# Patient Record
Sex: Female | Born: 1955 | Race: White | Hispanic: No | Marital: Married | Smoking: Never smoker
Health system: Southern US, Community
[De-identification: ages and names within clinical notes are randomized; demographics above are authoritative.]

## PROBLEM LIST (undated history)

## (undated) DIAGNOSIS — Z803 Family history of malignant neoplasm of breast: Secondary | ICD-10-CM

## (undated) DIAGNOSIS — Z8052 Family history of malignant neoplasm of bladder: Secondary | ICD-10-CM

## (undated) DIAGNOSIS — C539 Malignant neoplasm of cervix uteri, unspecified: Secondary | ICD-10-CM

## (undated) DIAGNOSIS — Z806 Family history of leukemia: Secondary | ICD-10-CM

## (undated) DIAGNOSIS — D219 Benign neoplasm of connective and other soft tissue, unspecified: Secondary | ICD-10-CM

## (undated) DIAGNOSIS — Z9889 Other specified postprocedural states: Secondary | ICD-10-CM

## (undated) DIAGNOSIS — R112 Nausea with vomiting, unspecified: Secondary | ICD-10-CM

## (undated) DIAGNOSIS — N200 Calculus of kidney: Secondary | ICD-10-CM

## (undated) DIAGNOSIS — C801 Malignant (primary) neoplasm, unspecified: Secondary | ICD-10-CM

## (undated) DIAGNOSIS — Z87442 Personal history of urinary calculi: Secondary | ICD-10-CM

## (undated) DIAGNOSIS — C50919 Malignant neoplasm of unspecified site of unspecified female breast: Secondary | ICD-10-CM

## (undated) DIAGNOSIS — M1990 Unspecified osteoarthritis, unspecified site: Secondary | ICD-10-CM

## (undated) DIAGNOSIS — I10 Essential (primary) hypertension: Secondary | ICD-10-CM

## (undated) DIAGNOSIS — Z8042 Family history of malignant neoplasm of prostate: Secondary | ICD-10-CM

## (undated) DIAGNOSIS — E7800 Pure hypercholesterolemia, unspecified: Secondary | ICD-10-CM

## (undated) DIAGNOSIS — C55 Malignant neoplasm of uterus, part unspecified: Secondary | ICD-10-CM

## (undated) DIAGNOSIS — Z800 Family history of malignant neoplasm of digestive organs: Secondary | ICD-10-CM

## (undated) DIAGNOSIS — K219 Gastro-esophageal reflux disease without esophagitis: Secondary | ICD-10-CM

## (undated) HISTORY — DX: Family history of malignant neoplasm of bladder: Z80.52

## (undated) HISTORY — DX: Family history of malignant neoplasm of digestive organs: Z80.0

## (undated) HISTORY — DX: Malignant (primary) neoplasm, unspecified: C80.1

## (undated) HISTORY — PX: CERVIX LESION DESTRUCTION: SHX591

## (undated) HISTORY — DX: Malignant neoplasm of cervix uteri, unspecified: C53.9

## (undated) HISTORY — DX: Family history of malignant neoplasm of prostate: Z80.42

## (undated) HISTORY — DX: Calculus of kidney: N20.0

## (undated) HISTORY — DX: Essential (primary) hypertension: I10

## (undated) HISTORY — DX: Malignant neoplasm of uterus, part unspecified: C55

## (undated) HISTORY — PX: LITHOTRIPSY: SUR834

## (undated) HISTORY — DX: Benign neoplasm of connective and other soft tissue, unspecified: D21.9

## (undated) HISTORY — DX: Family history of leukemia: Z80.6

## (undated) HISTORY — DX: Family history of malignant neoplasm of breast: Z80.3

## (undated) HISTORY — PX: COLPOSCOPY: SHX161

## (undated) HISTORY — PX: CHOLECYSTECTOMY: SHX55

## (undated) HISTORY — DX: Pure hypercholesterolemia, unspecified: E78.00

---

## 1995-02-03 HISTORY — PX: ABDOMINAL HYSTERECTOMY: SHX81

## 1999-04-09 ENCOUNTER — Other Ambulatory Visit: Admission: RE | Admit: 1999-04-09 | Discharge: 1999-04-09 | Payer: Self-pay | Admitting: Obstetrics and Gynecology

## 2000-04-08 ENCOUNTER — Other Ambulatory Visit: Admission: RE | Admit: 2000-04-08 | Discharge: 2000-04-08 | Payer: Self-pay | Admitting: Obstetrics and Gynecology

## 2001-04-11 ENCOUNTER — Other Ambulatory Visit: Admission: RE | Admit: 2001-04-11 | Discharge: 2001-04-11 | Payer: Self-pay | Admitting: Obstetrics and Gynecology

## 2002-04-03 ENCOUNTER — Ambulatory Visit (HOSPITAL_BASED_OUTPATIENT_CLINIC_OR_DEPARTMENT_OTHER): Admission: RE | Admit: 2002-04-03 | Discharge: 2002-04-03 | Payer: Self-pay | Admitting: Urology

## 2002-04-03 ENCOUNTER — Encounter: Payer: Self-pay | Admitting: Urology

## 2003-04-04 ENCOUNTER — Other Ambulatory Visit: Admission: RE | Admit: 2003-04-04 | Discharge: 2003-04-04 | Payer: Self-pay | Admitting: Obstetrics and Gynecology

## 2004-04-04 ENCOUNTER — Other Ambulatory Visit: Admission: RE | Admit: 2004-04-04 | Discharge: 2004-04-04 | Payer: Self-pay | Admitting: Obstetrics and Gynecology

## 2005-04-29 ENCOUNTER — Other Ambulatory Visit: Admission: RE | Admit: 2005-04-29 | Discharge: 2005-04-29 | Payer: Self-pay | Admitting: Obstetrics and Gynecology

## 2005-06-24 ENCOUNTER — Ambulatory Visit (HOSPITAL_BASED_OUTPATIENT_CLINIC_OR_DEPARTMENT_OTHER): Admission: RE | Admit: 2005-06-24 | Discharge: 2005-06-24 | Payer: Self-pay | Admitting: Plastic Surgery

## 2005-06-24 ENCOUNTER — Encounter (INDEPENDENT_AMBULATORY_CARE_PROVIDER_SITE_OTHER): Payer: Self-pay | Admitting: Specialist

## 2006-05-04 ENCOUNTER — Other Ambulatory Visit: Admission: RE | Admit: 2006-05-04 | Discharge: 2006-05-04 | Payer: Self-pay | Admitting: Obstetrics and Gynecology

## 2007-10-25 ENCOUNTER — Encounter: Payer: Self-pay | Admitting: Obstetrics and Gynecology

## 2007-10-25 ENCOUNTER — Other Ambulatory Visit: Admission: RE | Admit: 2007-10-25 | Discharge: 2007-10-25 | Payer: Self-pay | Admitting: Obstetrics and Gynecology

## 2007-10-25 ENCOUNTER — Ambulatory Visit: Payer: Self-pay | Admitting: Obstetrics and Gynecology

## 2009-02-19 ENCOUNTER — Other Ambulatory Visit: Admission: RE | Admit: 2009-02-19 | Discharge: 2009-02-19 | Payer: Self-pay | Admitting: Obstetrics and Gynecology

## 2009-02-19 ENCOUNTER — Ambulatory Visit: Payer: Self-pay | Admitting: Obstetrics and Gynecology

## 2009-04-30 ENCOUNTER — Ambulatory Visit: Payer: Self-pay | Admitting: Obstetrics and Gynecology

## 2010-02-25 ENCOUNTER — Other Ambulatory Visit: Payer: Self-pay | Admitting: Obstetrics and Gynecology

## 2010-02-25 ENCOUNTER — Other Ambulatory Visit
Admission: RE | Admit: 2010-02-25 | Discharge: 2010-02-25 | Payer: Self-pay | Source: Home / Self Care | Admitting: Obstetrics and Gynecology

## 2010-02-25 ENCOUNTER — Ambulatory Visit
Admission: RE | Admit: 2010-02-25 | Discharge: 2010-02-25 | Payer: Self-pay | Source: Home / Self Care | Attending: Obstetrics and Gynecology | Admitting: Obstetrics and Gynecology

## 2010-06-20 NOTE — Op Note (Signed)
Kelly Beltran, BE[place]              ACCOUNT NO.:  [id number]   MEDICAL RECORD NO.:  [id number]          PATIENT TYPE:  AMB   LOCATION:  DSC                          FACILITY:  [facility]   PHYSICIAN:  Alfredia Ferguson, M.D.  DATE OF BIRTH:  December 04, 1955   DATE OF PROCEDURE:  06/24/2005  DATE OF DISCHARGE:                                 OPERATIVE REPORT   PREOPERATIVE DIAGNOSES:  1.  4-mm pigmented nevus, left chin.  2.  7 mm non pigmented nevus, glabellar area.   POSTOPERATIVE DIAGNOSES:  1.  4-mm pigmented nevus, left chin.  2.  7 mm non pigmented nevus, glabellar area.   OPERATION:  Excision of nevi x2.   SURGEON:  Dr. Delia Chimes.   ANESTHESIA:  2% Xylocaine 1:100,000 epinephrine.   INDICATIONS FOR SURGERY:  This is a 55 year old woman who has slowly  enlarging nevi on her face.  One is a darkly pigmented nevus left chin which  is now approximately 4 mm in diameter.  The other one appears to be a  nonpigmented nevus approximately 7 mm which has occurred in her glabellar  area and has gotten quite large.  The patient wishes to have both these  areas excised.  She understands she is trading what she has for permanent  and potentially unsightly scar.  In spite of that, she wished to proceed  with surgery.   DESCRIPTION OF SURGERY:  Skin marks were placed in elliptical fashion around  both of the two lesions.  Local anesthesia was infiltrated and the face was  prepped with Betadine and draped with sterile drape.  Attention first  directed to the chin lesion which was excised in elliptical fashion down to  the level subcutaneous tissue.  Wound edges were undermined for distance of  several millimeters in all directions.  Hemostasis accomplished pressure.  The wound was closed by approximating the dermis with interrupted 4-0  Monocryl suture.  Skin edges were united with a interrupted 6-0 nylon  suture.  The glabellar lesion was excised in a similar fashion and was  closed in  identical fashion.  After closing the glabellar area, both areas  were cleansed and dried and light dressings applied.  The patient was  discharged home in satisfactory condition.      Alfredia Ferguson, M.D.  Electronically Signed     WBB/MEDQ  D:  06/24/2005  T:  06/24/2005  Job:  [id number]

## 2010-06-20 NOTE — Op Note (Signed)
Kelly Beltran, Kelly Beltran              ACCOUNT NO.:  [id number]   MEDICAL RECORD NO.:  [id number]          PATIENT TYPE:  AMB   LOCATION:  DSC                          FACILITY:  [facility]   PHYSICIAN:  Alfredia Ferguson, M.D.  DATE OF BIRTH:  10/12/1955   DATE OF PROCEDURE:  DATE OF DISCHARGE:                                 OPERATIVE REPORT   Audio too short to transcribe (less than 5 seconds)      W. Delia Chimes, M.D.     WBB/MEDQ  D:  06/24/2005  T:  06/24/2005  Job:  [id number]

## 2011-03-25 ENCOUNTER — Other Ambulatory Visit (HOSPITAL_COMMUNITY)
Admission: RE | Admit: 2011-03-25 | Discharge: 2011-03-25 | Disposition: A | Discharge status: Home / Self Care | Payer: BC Managed Care – PPO | Source: Ambulatory Visit | Attending: Obstetrics and Gynecology | Admitting: Obstetrics and Gynecology

## 2011-03-25 ENCOUNTER — Ambulatory Visit (INDEPENDENT_AMBULATORY_CARE_PROVIDER_SITE_OTHER): Payer: BC Managed Care – PPO | Admitting: Obstetrics and Gynecology

## 2011-03-25 ENCOUNTER — Encounter: Payer: Self-pay | Admitting: Obstetrics and Gynecology

## 2011-03-25 VITALS — BP 138/84 | Ht 62.0 in | Wt 189.0 lb

## 2011-03-25 DIAGNOSIS — E7800 Pure hypercholesterolemia, unspecified: Secondary | ICD-10-CM | POA: Insufficient documentation

## 2011-03-25 DIAGNOSIS — N200 Calculus of kidney: Secondary | ICD-10-CM | POA: Insufficient documentation

## 2011-03-25 DIAGNOSIS — Z01419 Encounter for gynecological examination (general) (routine) without abnormal findings: Secondary | ICD-10-CM | POA: Insufficient documentation

## 2011-03-25 DIAGNOSIS — D069 Carcinoma in situ of cervix, unspecified: Secondary | ICD-10-CM | POA: Insufficient documentation

## 2011-03-25 DIAGNOSIS — D219 Benign neoplasm of connective and other soft tissue, unspecified: Secondary | ICD-10-CM | POA: Insufficient documentation

## 2011-03-25 DIAGNOSIS — E785 Hyperlipidemia, unspecified: Secondary | ICD-10-CM | POA: Insufficient documentation

## 2011-03-25 NOTE — Progress Notes (Signed)
Patient came to see me today for her annual GYN exam. 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 She's had no recurrences. She is having no pelvic pain. She is having no vaginal bleeding. She does her lab work with her PCP. She is due for a mammogram. She's had 2 normal bone densities. She's had no fractures. She is having minimal menopausal symptoms. They do not require estrogen.  HEENT: Within normal limits. Kennon Portela present. Neck: No masses. Supraclavicular lymph nodes: Not enlarged. Breasts: Examined in both sitting and lying position. Symmetrical without skin changes or masses. Abdomen: Soft no masses guarding or rebound. No hernias. Pelvic: External within normal limits. BUS within normal limits. Vaginal examination shows good estrogen effect, no cystocele enterocele or rectocele. Cervix and uterus absent. Adnexa within normal limits. Rectovaginal confirmatory. Extremities within normal limits.  Assessment: Adenocarcinoma in situ of uterine cervix. Mild menopausal symptoms.  Plan: Mammogram.

## 2011-03-26 LAB — URINALYSIS W MICROSCOPIC + REFLEX CULTURE
Bacteria, UA: NONE SEEN
Bilirubin Urine: NEGATIVE
Casts: NONE SEEN
Crystals: NONE SEEN
Glucose, UA: NEGATIVE mg/dL
Ketones, ur: NEGATIVE mg/dL
Specific Gravity, Urine: 1.009 (ref 1.005–1.030)
Squamous Epithelial / LPF: NONE SEEN
Urobilinogen, UA: 0.2 mg/dL (ref 0.0–1.0)
pH: 6 (ref 5.0–8.0)

## 2012-03-30 ENCOUNTER — Other Ambulatory Visit (HOSPITAL_COMMUNITY)
Admission: RE | Admit: 2012-03-30 | Discharge: 2012-03-30 | Disposition: A | Discharge status: Home / Self Care | Payer: BC Managed Care – PPO | Source: Ambulatory Visit | Attending: Gynecology | Admitting: Gynecology

## 2012-03-30 ENCOUNTER — Encounter: Payer: Self-pay | Admitting: Gynecology

## 2012-03-30 ENCOUNTER — Ambulatory Visit (INDEPENDENT_AMBULATORY_CARE_PROVIDER_SITE_OTHER): Payer: BC Managed Care – PPO | Admitting: Gynecology

## 2012-03-30 VITALS — BP 118/74 | Ht 62.0 in | Wt 192.0 lb

## 2012-03-30 DIAGNOSIS — Z1151 Encounter for screening for human papillomavirus (HPV): Secondary | ICD-10-CM | POA: Insufficient documentation

## 2012-03-30 DIAGNOSIS — R1900 Intra-abdominal and pelvic swelling, mass and lump, unspecified site: Secondary | ICD-10-CM

## 2012-03-30 DIAGNOSIS — N951 Menopausal and female climacteric states: Secondary | ICD-10-CM | POA: Insufficient documentation

## 2012-03-30 DIAGNOSIS — Z1159 Encounter for screening for other viral diseases: Secondary | ICD-10-CM

## 2012-03-30 DIAGNOSIS — Z01419 Encounter for gynecological examination (general) (routine) without abnormal findings: Secondary | ICD-10-CM | POA: Insufficient documentation

## 2012-03-30 DIAGNOSIS — R198 Other specified symptoms and signs involving the digestive system and abdomen: Secondary | ICD-10-CM

## 2012-03-30 NOTE — Patient Instructions (Addendum)

## 2012-03-30 NOTE — Progress Notes (Signed)
Kelly Beltran 07-10-1955 [id number]   History:    57 y.o.  for annual gyn exam with no complaints today. Review of patient's records indicated the following:  1991 cervical conization adenocarcinoma the cervix 1995 cesarean section 1997 total abdominal hysterectomy Subsequent followup Pap smears negative  Colonoscopy not done yet. Bone density study normal 2011. Last Pap smear normal 2013. Patient's mother with history of colon cancer. Patient declined flu vaccine. Patient will check with her primary physician if she has received a Tdap vaccine.  Past medical history,surgical history, family history and social history were all reviewed and documented in the EPIC chart.  Gynecologic History No LMP recorded. Patient has had a hysterectomy. Contraception: status post hysterectomy Last Pap: 2013. Results were: normal Last mammogram: 2011. Results were: normal  Obstetric History OB History   Grav Para Term Preterm Abortions TAB SAB Ect Mult Living   2 1   1  1   1      # Outc Date GA Lbr Len/2nd Wgt Sex Del Anes PTL Lv   1 PAR            2 SAB                ROS: A ROS was performed and pertinent positives and negatives are included in the history.  GENERAL: No fevers or chills. HEENT: No change in vision, no earache, sore throat or sinus congestion. NECK: No pain or stiffness. CARDIOVASCULAR: No chest pain or pressure. No palpitations. PULMONARY: No shortness of breath, cough or wheeze. GASTROINTESTINAL: No abdominal pain, nausea, vomiting or diarrhea, melena or bright red blood per rectum. GENITOURINARY: No urinary frequency, urgency, hesitancy or dysuria. MUSCULOSKELETAL: No joint or muscle pain, no back pain, no recent trauma. DERMATOLOGIC: No rash, no itching, no lesions. ENDOCRINE: No polyuria, polydipsia, no heat or cold intolerance. No recent change in weight. HEMATOLOGICAL: No anemia or easy bruising or bleeding. NEUROLOGIC: No headache, seizures, numbness, tingling or weakness.  PSYCHIATRIC: No depression, no loss of interest in normal activity or change in sleep pattern.     Exam: chaperone present  BP 118/74  Ht 5\' 2"  (1.575 m)  Wt 192 lb (87.091 kg)  BMI 35.11 kg/m2  Body mass index is 35.11 kg/(m^2).  General appearance : Well developed well nourished female. No acute distress HEENT: Neck supple, trachea midline, no carotid bruits, no thyroidmegaly Lungs: Clear to auscultation, no rhonchi or wheezes, or rib retractions  Heart: Regular rate and rhythm, no murmurs or gallops Breast:Examined in sitting and supine position were symmetrical in appearance, no palpable masses or tenderness,  no skin retraction, no nipple inversion, no nipple discharge, no skin discoloration, no axillary or supraclavicular lymphadenopathy Abdomen: no palpable masses or tenderness, no rebound or guarding Extremities: no edema or skin discoloration or tenderness  Pelvic:  Bartholin, Urethra, Skene Glands: Within normal limits             Vagina: No gross lesions or discharge  Cervix:absent  Uterus Absent  Adnexa  Limited due to patient's abdominal girth  Anus and perineum  normal   Rectovaginal  normal sphincter tone without palpated masses or tenderness             Hemoccult       Assessment/Plan:  57 y.o. female for annual exam who needs to schedule colonoscopy. Her mother had history of colon cancer. Her Pap smear was done today. Patient with prior history of adenocarcinoma of the cervix status post TAH in 1997 subsequent  Pap smears normal. Due to limited pelvic exam as a result of patient's abdominal girth she will return to the office next week for an ultrasound. She will also schedule a bone density which is overdue as well. She was reminded to do her breast examination. We discussed importance of calcium vitamin D for osteoporosis prevention as well as regular exercise.  New CDC guidelines is recommending patients be tested once in her lifetime for hepatitis C antibody  who were born between [birth year] through 1966. This was discussed with the patient today and has agreed to be tested today.    Ok Edwards MD, 1:43 PM 03/30/2012

## 2012-04-04 ENCOUNTER — Encounter: Payer: Self-pay | Admitting: Obstetrics and Gynecology

## 2012-04-08 ENCOUNTER — Other Ambulatory Visit: Payer: BC Managed Care – PPO

## 2012-04-08 ENCOUNTER — Ambulatory Visit: Payer: BC Managed Care – PPO | Admitting: Gynecology

## 2012-04-28 ENCOUNTER — Ambulatory Visit (INDEPENDENT_AMBULATORY_CARE_PROVIDER_SITE_OTHER): Payer: BC Managed Care – PPO

## 2012-04-28 DIAGNOSIS — N951 Menopausal and female climacteric states: Secondary | ICD-10-CM

## 2012-04-28 DIAGNOSIS — Z13820 Encounter for screening for osteoporosis: Secondary | ICD-10-CM

## 2012-05-02 ENCOUNTER — Encounter: Payer: Self-pay | Admitting: Gynecology

## 2013-04-19 ENCOUNTER — Encounter: Payer: Self-pay | Admitting: Gynecology

## 2013-05-10 ENCOUNTER — Ambulatory Visit (INDEPENDENT_AMBULATORY_CARE_PROVIDER_SITE_OTHER): Payer: BC Managed Care – PPO | Admitting: Gynecology

## 2013-05-10 ENCOUNTER — Other Ambulatory Visit (HOSPITAL_COMMUNITY)
Admission: RE | Admit: 2013-05-10 | Discharge: 2013-05-10 | Disposition: A | Discharge status: Home / Self Care | Payer: BC Managed Care – PPO | Source: Ambulatory Visit | Attending: Gynecology | Admitting: Gynecology

## 2013-05-10 ENCOUNTER — Encounter: Payer: Self-pay | Admitting: Gynecology

## 2013-05-10 VITALS — BP 128/82 | Ht 61.25 in | Wt 168.0 lb

## 2013-05-10 DIAGNOSIS — Z01419 Encounter for gynecological examination (general) (routine) without abnormal findings: Secondary | ICD-10-CM

## 2013-05-10 DIAGNOSIS — Z87410 Personal history of cervical dysplasia: Secondary | ICD-10-CM

## 2013-05-10 NOTE — Patient Instructions (Signed)

## 2013-05-10 NOTE — Progress Notes (Signed)
Kelly Beltran [birth year] [id number]   History:    58 y.o.  for annual gyn exam with no complaints today. Patient has been eating healthier and exercising regularly she was weighing 192 pounds last year and is down to 168 pounds. Review of patient's records indicated the following: 1991 cervical conization adenocarcinoma the cervix  1995 cesarean section  1997 total abdominal hysterectomy  Subsequent followup Pap smears negative  Colonoscopy not yet done Bone density study normal 2014    Past medical history,surgical history, family history and social history were all reviewed and documented in the EPIC chart.  Gynecologic History No LMP recorded. Patient has had a hysterectomy. Contraception: post menopausal status Last Pap: 2014. Results were: normal Last mammogram: 2015. Results were: normal  Obstetric History OB History  Gravida Para Term Preterm AB SAB TAB Ectopic Multiple Living  2 1   1 1    1     # Outcome Date GA Lbr Len/2nd Weight Sex Delivery Anes PTL Lv  2 SAB           1 PAR                ROS: A ROS was performed and pertinent positives and negatives are included in the history.  GENERAL: No fevers or chills. HEENT: No change in vision, no earache, sore throat or sinus congestion. NECK: No pain or stiffness. CARDIOVASCULAR: No chest pain or pressure. No palpitations. PULMONARY: No shortness of breath, cough or wheeze. GASTROINTESTINAL: No abdominal pain, nausea, vomiting or diarrhea, melena or bright red blood per rectum. GENITOURINARY: No urinary frequency, urgency, hesitancy or dysuria. MUSCULOSKELETAL: No joint or muscle pain, no back pain, no recent trauma. DERMATOLOGIC: No rash, no itching, no lesions. ENDOCRINE: No polyuria, polydipsia, no heat or cold intolerance. No recent change in weight. HEMATOLOGICAL: No anemia or easy bruising or bleeding. NEUROLOGIC: No headache, seizures, numbness, tingling or weakness. PSYCHIATRIC: No depression, no loss of  interest in normal activity or change in sleep pattern.     Exam: chaperone present  BP 128/82  Ht 5' 1.25" (1.556 m)  Wt 168 lb (76.204 kg)  BMI 31.47 kg/m2  Body mass index is 31.47 kg/(m^2).  General appearance : Well developed well nourished female. No acute distress HEENT: Neck supple, trachea midline, no carotid bruits, no thyroidmegaly Lungs: Clear to auscultation, no rhonchi or wheezes, or rib retractions  Heart: Regular rate and rhythm, no murmurs or gallops Breast:Examined in sitting and supine position were symmetrical in appearance, no palpable masses or tenderness,  no skin retraction, no nipple inversion, no nipple discharge, no skin discoloration, no axillary or supraclavicular lymphadenopathy Abdomen: no palpable masses or tenderness, no rebound or guarding Extremities: no edema or skin discoloration or tenderness  Pelvic:  Bartholin, Urethra, Skene Glands: Within normal limits             Vagina: No gross lesions or discharge  Cervix: Absent  Uterus absent  Adnexa  Without masses or tenderness  Anus and perineum  normal   Rectovaginal  normal sphincter tone without palpated masses or tenderness             Hemoccult cards provided     Assessment/Plan:  58 y.o. female for annual exam who needs to schedule colonoscopy. Her mother had history of colon cancer. Her Pap smear was done today. Patient with prior history of adenocarcinoma of the cervix status post TAH in 1997 subsequent Pap smears normal. Fecal Hemoccult cards provided for patient  to submit to the office for testing. Patient's PCP in [facility] has been doing her blood work. Patient was reminded of the importance of calcium and vitamin D in regular exercise for osteoporosis prevention as well as monthly self breast exams.   Note: This dictation was prepared with  Dragon/digital dictation along withSmart phrase technology. Any transcriptional errors that result from this process are  unintentional.   Terrance Mass MD, 12:22 PM 05/10/2013

## 2013-08-08 ENCOUNTER — Other Ambulatory Visit: Payer: BC Managed Care – PPO | Admitting: Anesthesiology

## 2013-08-08 DIAGNOSIS — Z1211 Encounter for screening for malignant neoplasm of colon: Secondary | ICD-10-CM

## 2013-12-04 ENCOUNTER — Encounter: Payer: Self-pay | Admitting: Gynecology

## 2013-12-11 ENCOUNTER — Other Ambulatory Visit: Payer: Self-pay | Admitting: Dermatology

## 2014-05-17 ENCOUNTER — Encounter: Payer: Self-pay | Admitting: Gynecology

## 2014-05-21 ENCOUNTER — Ambulatory Visit (INDEPENDENT_AMBULATORY_CARE_PROVIDER_SITE_OTHER): Payer: BLUE CROSS/BLUE SHIELD | Admitting: Gynecology

## 2014-05-21 ENCOUNTER — Other Ambulatory Visit (HOSPITAL_COMMUNITY)
Admission: RE | Admit: 2014-05-21 | Discharge: 2014-05-21 | Disposition: A | Discharge status: Home / Self Care | Payer: BLUE CROSS/BLUE SHIELD | Source: Ambulatory Visit | Attending: Gynecology | Admitting: Gynecology

## 2014-05-21 ENCOUNTER — Encounter: Payer: Self-pay | Admitting: Gynecology

## 2014-05-21 VITALS — BP 122/78 | Ht 62.0 in | Wt 180.0 lb

## 2014-05-21 DIAGNOSIS — N951 Menopausal and female climacteric states: Secondary | ICD-10-CM | POA: Diagnosis not present

## 2014-05-21 DIAGNOSIS — Z01419 Encounter for gynecological examination (general) (routine) without abnormal findings: Secondary | ICD-10-CM

## 2014-05-21 DIAGNOSIS — L680 Hirsutism: Secondary | ICD-10-CM | POA: Diagnosis not present

## 2014-05-21 DIAGNOSIS — Z780 Asymptomatic menopausal state: Secondary | ICD-10-CM

## 2014-05-21 MED ORDER — ESTRADIOL 10 MCG VA TABS: 1.0000 | ORAL_TABLET | VAGINAL | Status: DC

## 2014-05-21 NOTE — Patient Instructions (Addendum)
Hormone Therapy At menopause, your body begins making less estrogen and progesterone hormones. This causes the body to stop having menstrual periods. This is because estrogen and progesterone hormones control your periods and menstrual cycle. A lack of estrogen may cause symptoms such as:  Hot flushes (or hot flashes).  Vaginal dryness.  Dry skin.  Loss of sex drive.  Risk of bone loss (osteoporosis). When this happens, you may choose to take hormone therapy to get back the estrogen lost during menopause. When the hormone estrogen is given alone, it is usually referred to as ET (Estrogen Therapy). When the hormone progestin is combined with estrogen, it is generally called HT (Hormone Therapy). This was formerly known as hormone replacement therapy (HRT). Your caregiver can help you make a decision on what will be best for you. The decision to use HT seems to change often as new studies are done. Many studies do not agree on the benefits of hormone replacement therapy. LIKELY BENEFITS OF HT INCLUDE PROTECTION FROM:  Hot Flushes (also called hot flashes) - A hot flush is a sudden feeling of heat that spreads over the face and body. The skin may redden like a blush. It is connected with sweats and sleep disturbance. Women going through menopause may have hot flushes a few times a month or several times per day depending on the woman.  Osteoporosis (bone loss)- Estrogen helps guard against bone loss. After menopause, a woman's bones slowly lose calcium and become weak and brittle. As a result, bones are more likely to break. The hip, wrist, and spine are affected most often. Hormone therapy can help slow bone loss after menopause. Weight bearing exercise and taking calcium with vitamin D also can help prevent bone loss. There are also medications that your caregiver can prescribe that can help prevent osteoporosis.  Vaginal Dryness - Loss of estrogen causes changes in the vagina. Its lining may  become thin and dry. These changes can cause pain and bleeding during sexual intercourse. Dryness can also lead to infections. This can cause burning and itching. (Vaginal estrogen treatment can help relieve pain, itching, and dryness.)  Urinary Tract Infections are more common after menopause because of lack of estrogen. Some women also develop urinary incontinence because of low estrogen levels in the vagina and bladder.  Possible other benefits of estrogen include a positive effect on mood and short-term memory in women. RISKS AND COMPLICATIONS  Using estrogen alone without progesterone causes the lining of the uterus to grow. This increases the risk of lining of the uterus (endometrial) cancer. Your caregiver should give another hormone called progestin if you have a uterus.  Women who take combined (estrogen and progestin) HT appear to have an increased risk of breast cancer. The risk appears to be small, but increases throughout the time that HT is taken.  Combined therapy also makes the breast tissue slightly denser which makes it harder to read mammograms (breast X-rays).  Combined, estrogen and progesterone therapy can be taken together every day, in which case there may be spotting of blood. HT therapy can be taken cyclically in which case you will have menstrual periods. Cyclically means HT is taken for a set amount of days, then not taken, then this process is repeated.  HT may increase the risk of stroke, heart attack, breast cancer and forming blood clots in your leg.  Transdermal estrogen (estrogen that is absorbed through the skin with a patch or a cream) may have more positive results with:    Cholesterol.  Blood pressure.  Blood clots. Having the following conditions may indicate you should not have HT:  Endometrial cancer.  Liver disease.  Breast cancer.  Heart disease.  History of blood clots.  Stroke. TREATMENT   If you choose to take HT and have a uterus,  usually estrogen and progestin are prescribed.  Your caregiver will help you decide the best way to take the medications.  Possible ways to take estrogen include:  Pills.  Patches.  Gels.  Sprays.  Vaginal estrogen cream, rings and tablets.  It is best to take the lowest dose possible that will help your symptoms and take them for the shortest period of time that you can.  Hormone therapy can help relieve some of the problems (symptoms) that affect women at menopause. Before making a decision about HT, talk to your caregiver about what is best for you. Be well informed and comfortable with your decisions. HOME CARE INSTRUCTIONS   Follow your caregivers advice when taking the medications.  A Pap test is done to screen for cervical cancer.  The first Pap test should be done at age 21.  Between ages 21 and 29, Pap tests are repeated every 2 years.  Beginning at age 30, you are advised to have a Pap test every 3 years as long as your past 3 Pap tests have been normal.  Some women have medical problems that increase the chance of getting cervical cancer. Talk to your caregiver about these problems. It is especially important to talk to your caregiver if a new problem develops soon after your last Pap test. In these cases, your caregiver may recommend more frequent screening and Pap tests.  The above recommendations are the same for women who have or have not gotten the vaccine for HPV (Human Papillomavirus).  If you had a hysterectomy for a problem that was not a cancer or a condition that could lead to cancer, then you no longer need Pap tests. However, even if you no longer need a Pap test, a regular exam is a good idea to make sure no other problems are starting.   If you are between ages 65 and 70, and you have had normal Pap tests going back 10 years, you no longer need Pap tests. However, even if you no longer need a Pap test, a regular exam is a good idea to make sure no  other problems are starting.   If you have had past treatment for cervical cancer or a condition that could lead to cancer, you need Pap tests and screening for cancer for at least 20 years after your treatment.  If Pap tests have been discontinued, risk factors (such as a new sexual partner) need to be re-assessed to determine if screening should be resumed.  Some women may need screenings more often if they are at high risk for cervical cancer.  Get mammograms done as per the advice of your caregiver. SEEK IMMEDIATE MEDICAL CARE IF:  You develop abnormal vaginal bleeding.  You have pain or swelling in your legs, shortness of breath, or chest pain.  You develop dizziness or headaches.  You have lumps or changes in your breasts or armpits.  You have slurred speech.  You develop weakness or numbness of your arms or legs.  You have pain, burning, or bleeding when urinating.  You develop abdominal pain. Document Released: 10/18/2002 Document Revised: 04/13/2011 Document Reviewed: 02/05/2010 [employer] Patient Information 2015 [employer]. This information is not intended to   replace advice given to you by your health care provider. Make sure you discuss any questions you have with your health care provider. Menopause Menopause is the normal time of life when menstrual periods stop completely. Menopause is complete when you have missed 12 consecutive menstrual periods. It usually occurs between the ages of 25 years and 19 years. Very rarely does a woman develop menopause before the age of 65 years. At menopause, your ovaries stop producing the female hormones estrogen and progesterone. This can cause undesirable symptoms and also affect your health. Sometimes the symptoms may occur 4-5 years before the menopause begins. There is no relationship between menopause and:  Oral contraceptives.  Number of children you had.  Race.  The age your menstrual periods started  (menarche). Heavy smokers and very thin women may develop menopause earlier in life. CAUSES  The ovaries stop producing the female hormones estrogen and progesterone.  Other causes include:  Surgery to remove both ovaries.  The ovaries stop functioning for no known reason.  Tumors of the pituitary gland in the brain.  Medical disease that affects the ovaries and hormone production.  Radiation treatment to the abdomen or pelvis.  Chemotherapy that affects the ovaries. SYMPTOMS   Hot flashes.  Night sweats.  Decrease in sex drive.  Vaginal dryness and thinning of the vagina causing painful intercourse.  Dryness of the skin and developing wrinkles.  Headaches.  Tiredness.  Irritability.  Memory problems.  Weight gain.  Bladder infections.  Hair growth of the face and chest.  Infertility. More serious symptoms include:  Loss of bone (osteoporosis) causing breaks (fractures).  Depression.  Hardening and narrowing of the arteries (atherosclerosis) causing heart attacks and strokes. DIAGNOSIS   When the menstrual periods have stopped for 12 straight months.  Physical exam.  Hormone studies of the blood. TREATMENT  There are many treatment choices and nearly as many questions about them. The decisions to treat or not to treat menopausal changes is an individual choice made with your health care provider. Your health care provider can discuss the treatments with you. Together, you can decide which treatment will work best for you. Your treatment choices may include:   Hormone therapy (estrogen and progesterone).  Non-hormonal medicines.  Treating the individual symptoms with medicine (for example antidepressants for depression).  Herbal medicines that may help specific symptoms.  Counseling by a psychiatrist or psychologist.  Group therapy.  Lifestyle changes including:  Eating healthy.  Regular exercise.  Limiting caffeine and  alcohol.  Stress management and meditation.  No treatment. HOME CARE INSTRUCTIONS   Take the medicine your health care provider gives you as directed.  Get plenty of sleep and rest.  Exercise regularly.  Eat a diet that contains calcium (good for the bones) and soy products (acts like estrogen hormone).  Avoid alcoholic beverages.  Do not smoke.  If you have hot flashes, dress in layers.  Take supplements, calcium, and vitamin D to strengthen bones.  You can use over-the-counter lubricants or moisturizers for vaginal dryness.  Group therapy is sometimes very helpful.  Acupuncture may be helpful in some cases. SEEK MEDICAL CARE IF:   You are not sure you are in menopause.  You are having menopausal symptoms and need advice and treatment.  You are still having menstrual periods after age 76 years.  You have pain with intercourse.  Menopause is complete (no menstrual period for 12 months) and you develop vaginal bleeding.  You need a referral to a specialist (  gynecologist, psychiatrist, or psychologist) for treatment. SEEK IMMEDIATE MEDICAL CARE IF:   You have severe depression.  You have excessive vaginal bleeding.  You fell and think you have a broken bone.  You have pain when you urinate.  You develop leg or chest pain.  You have a fast pounding heart beat (palpitations).  You have severe headaches.  You develop vision problems.  You feel a lump in your breast.  You have abdominal pain or severe indigestion. Document Released: 04/11/2003 Document Revised: 09/21/2012 Document Reviewed: 08/18/2012 [facility] Patient Information 2015 [place], [place]. This information is not intended to replace advice given to you by your health care provider. Make sure you discuss any questions you have with your health care provider. Estradiol vaginal tablets What is this medicine? ESTRADIOL (es tra DYE ole) vaginal tablet is used to help relieve symptoms of vaginal  irritation and dryness that occurs in some women during menopause. This medicine may be used for other purposes; ask your health care provider or pharmacist if you have questions. COMMON BRAND NAME(S): Vagifem What should I tell my health care provider before I take this medicine? They need to know if you have any of these conditions: -abnormal vaginal bleeding -blood vessel disease or blood clots -breast, cervical, endometrial, ovarian, liver, or uterine cancer -dementia -diabetes -gallbladder disease -heart disease or recent heart attack -high blood pressure -high cholesterol -high level of calcium in the blood -hysterectomy -kidney disease -liver disease -migraine headaches -protein C deficiency -protein S deficiency -stroke -systemic lupus erythematosus (SLE) -tobacco smoker -an unusual or allergic reaction to estrogens, other hormones, medicines, foods, dyes, or preservatives -pregnant or trying to get pregnant -breast-feeding How should I use this medicine? This medicine is only for use in the vagina. Do not take by mouth. Wash your hands before and after use. Read package directions carefully. Unwrap the pre-filled applicator package. Lie on your back, part and bend your knees. Gently insert the applicator tip high in the vagina and push the plunger to release the tablet into the vagina. Gently remove the applicator. Throw away the applicator after use. Do not use your medicine more often than directed. Finish the full course prescribed by your doctor or health care professional even if you think your condition is better. Do not stop using except on the advice of your doctor or health care professional. Talk to your pediatrician regarding the use of this medicine in children. A patient package insert for the product will be given with each prescription and refill. Read this sheet carefully each time. The sheet may change frequently. Overdosage: If you think you have taken too  much of this medicine contact a poison control center or emergency room at once. NOTE: This medicine is only for you. Do not share this medicine with others. What if I miss a dose? If you miss a dose, take it as soon as you can. If it is almost time for your next dose, take only that dose. Do not take double or extra doses. What may interact with this medicine? Do not take this medicine with any of the following medications: -aromatase inhibitors like aminoglutethimide, anastrozole, exemestane, letrozole, testolactone This medicine may also interact with the following medications: -antibiotics used to treat tuberculosis like rifabutin, rifampin and rifapentene -raloxifene or tamoxifen -warfarin This list may not describe all possible interactions. Give your health care provider a list of all the medicines, herbs, non-prescription drugs, or dietary supplements you use. Also tell them if you smoke, drink  alcohol, or use illegal drugs. Some items may interact with your medicine. What should I watch for while using this medicine? Visit your health care professional for regular checks on your progress. You will need a regular breast and pelvic exam. You should also discuss the need for regular mammograms with your health care professional, and follow his or her guidelines. This medicine can make your body retain fluid, making your fingers, hands, or ankles swell. Your blood pressure can go up. Contact your doctor or health care professional if you feel you are retaining fluid. If you have any reason to think you are pregnant; stop taking this medicine at once and contact your doctor or health care professional. Tobacco smoking increases the risk of getting a blood clot or having a stroke, especially if you are more than 60 years old. You are strongly advised not to smoke. If you wear contact lenses and notice visual changes, or if the lenses begin to feel uncomfortable, consult your eye care  specialist. If you are going to have elective surgery, you may need to stop taking this medicine beforehand. Consult your health care professional for advice prior to scheduling the surgery. What side effects may I notice from receiving this medicine? Side effects that you should report to your doctor or health care professional as soon as possible: -allergic reactions like skin rash, itching or hives, swelling of the face, lips, or tongue -breast tissue changes or discharge -changes in vision -chest pain -confusion, trouble speaking or understanding -dark urine -general ill feeling or flu-like symptoms -light-colored stools -nausea, vomiting -pain, swelling, warmth in the leg -right upper belly pain -severe headaches -shortness of breath -sudden numbness or weakness of the face, arm or leg -trouble walking, dizziness, loss of balance or coordination -unusual vaginal bleeding -yellowing of the eyes or skin Side effects that usually do not require medical attention (report to your doctor or health care professional if they continue or are bothersome): -hair loss -increased hunger or thirst -increased urination -symptoms of vaginal infection like itching, irritation or unusual discharge -unusually weak or tired This list may not describe all possible side effects. Call your doctor for medical advice about side effects. You may report side effects to FDA at 1-800-FDA-1088. Where should I keep my medicine? Keep out of the reach of children. Store at room temperature between 15 and 30 degrees C (59 and 86 degrees F). Throw away any unused medicine after the expiration date. NOTE: This sheet is a summary. It may not cover all possible information. If you have questions about this medicine, talk to your doctor, pharmacist, or health care provider.  2015, [employer]. (2010-04-23 09:08:58) Bone Densitometry Bone densitometry is a special X-ray that measures your bone density and  can be used to help predict your risk of bone fractures. This test is used to determine bone mineral content and density to diagnose osteoporosis. Osteoporosis is the loss of bone that may cause the bone to become weak. Osteoporosis commonly occurs in women entering menopause. However, it may be found in men and in people with other diseases. PREPARATION FOR TEST No preparation necessary. WHO SHOULD BE TESTED?  All women older than 1.  Postmenopausal women (50 to 45) with risk factors for osteoporosis.  People with a previous fracture caused by normal activities.  People with a small body frame (less than 127 poundsor a body mass index [BMI] of less than 21).  People who have a parent with a hip fracture or history  of osteoporosis.  People who smoke.  People who have rheumatoid arthritis.  Anyone who engages in excessive alcohol use (more than 3 drinks most days).  Women who experience early menopause. WHEN SHOULD YOU BE RETESTED? Current guidelines suggest that you should wait at least 2 years before doing a bone density test again if your first test was normal.Recent studies indicated that women with normal bone density may be able to wait a few years before needing to repeat a bone density test. You should discuss this with your caregiver.  NORMAL FINDINGS   Normal: less than standard deviation below normal (greater than -1).  Osteopenia: 1 to 2.5 standard deviations below normal (-1 to -2.5).  Osteoporosis: greater than 2.5 standard deviations below normal (less than -2.5). Test results are reported as a "T score" and a "Z score."The T score is a number that compares your bone density with the bone density of healthy, young women.The Z score is a number that compares your bone density with the scores of women who are the same age, gender, and race.  Ranges for normal findings may vary among different laboratories and hospitals. You should always check with your doctor after  having lab work or other tests done to discuss the meaning of your test results and whether your values are considered within normal limits. MEANING OF TEST  Your caregiver will go over the test results with you and discuss the importance and meaning of your results, as well as treatment options and the need for additional tests if necessary. OBTAINING THE TEST RESULTS It is your responsibility to obtain your test results. Ask the lab or department performing the test when and how you will get your results. Document Released: 02/11/2004 Document Revised: 04/13/2011 Document Reviewed: 03/05/2010 [facility] Patient Information 2015 [place], [place]. This information is not intended to replace advice given to you by your health care provider. Make sure you discuss any questions you have with your health care provider.

## 2014-05-21 NOTE — Progress Notes (Signed)
Kelly Beltran 16-Nov-1955 [id number]   History:    59 y.o.  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 Patient also is been complaining of gradual increase of hair underneath her chin and upper lip.  Past medical history,surgical history, family history and social history were all reviewed and documented in the EPIC chart.  Gynecologic History No LMP recorded. Patient has had a hysterectomy. Contraception: status post hysterectomy Last Pap: 2015. Results were: normal Last mammogram: 2016. Results were: normal  Obstetric History OB History  Gravida Para Term Preterm AB SAB TAB Ectopic Multiple Living  2 1   1 1    1     # Outcome Date GA Lbr Len/2nd Weight Sex Delivery Anes PTL Lv  2 SAB           1 Para                ROS: A ROS was performed and pertinent positives and negatives are included in the history.  GENERAL: No fevers or chills. HEENT: No change in vision, no earache, sore throat or sinus congestion. NECK: No pain or stiffness. CARDIOVASCULAR: No chest pain or pressure. No palpitations. PULMONARY: No shortness of breath, cough or wheeze. GASTROINTESTINAL: No abdominal pain, nausea, vomiting or diarrhea, melena or bright red blood per rectum. GENITOURINARY: No urinary frequency, urgency, hesitancy or dysuria. MUSCULOSKELETAL: No joint or muscle pain, no back pain, no recent trauma. DERMATOLOGIC: No rash, no itching, no lesions. ENDOCRINE: No polyuria, polydipsia, no heat or cold intolerance. No recent change in weight. HEMATOLOGICAL: No anemia or easy bruising or bleeding. NEUROLOGIC: No headache, seizures, numbness, tingling or weakness.  PSYCHIATRIC: No depression, no loss of interest in normal activity or change in sleep pattern.     Exam: chaperone present  BP 122/78 mmHg  Ht 5\' 2"  (1.575 m)  Wt 180 lb (81.647 kg)  BMI 32.91 kg/m2  Body mass index is 32.91 kg/(m^2).  General appearance : Well developed well nourished female. No acute distress HEENT: Eyes: no retinal hemorrhage or exudates,  Neck supple, trachea midline, no carotid bruits, no thyroidmegaly Lungs: Clear to auscultation, no rhonchi or wheezes, or rib retractions  Heart: Regular rate and rhythm, no murmurs or gallops Breast:Examined in sitting and supine position were symmetrical in appearance, no palpable masses or tenderness,  no skin retraction, no nipple inversion, no nipple discharge, no skin discoloration, no axillary or supraclavicular lymphadenopathy Abdomen: no palpable masses or tenderness, no rebound or guarding Extremities: no edema or skin discoloration or tenderness  Pelvic:  Bartholin, Urethra, Skene Glands: Within normal limits             Vagina: No gross lesions or discharge  Cervix: absent  Uterus absent  Adnexa  Without masses or tenderness  Anus and perineum  normal   Rectovaginal  normal sphincter tone without palpated masses or tenderness             Hemoccult PCP provides     Assessment/Plan:  59 y.o. female for annual exam with severe  Menopausal hot flashes and vaginal dryness. Patient concern about hormone replacement therapy. We discussed Vagifem 10 g to apply intravaginally twice a week for vaginal atrophy which  has minimal absorption and she would like to try it so it was prescribed. For vasomotor symptoms she is going to buy over-the-counter peppermint oil to apply to dabs behind each ear twice a day. Patient will be scheduled to undergo an ultrasound here in the office in the next week for better assessment of her ovaries since she is overweight and complaining of her to tears and to make sure that there is no ovarian  cyst. She was schedule her bone density study for May. Her PCP we'll be doing her blood work. She was going to be referred to the gastroenterologist for her overdue colonoscopy. Pap smear done today.   Terrance Mass MD, 3:31 PM 05/21/2014

## 2014-05-23 LAB — CYTOLOGY - PAP

## 2014-06-26 ENCOUNTER — Ambulatory Visit (INDEPENDENT_AMBULATORY_CARE_PROVIDER_SITE_OTHER): Payer: BLUE CROSS/BLUE SHIELD

## 2014-06-26 ENCOUNTER — Other Ambulatory Visit: Payer: Self-pay | Admitting: Gynecology

## 2014-06-26 DIAGNOSIS — Z13820 Encounter for screening for osteoporosis: Secondary | ICD-10-CM

## 2014-06-26 DIAGNOSIS — Z780 Asymptomatic menopausal state: Secondary | ICD-10-CM | POA: Diagnosis not present

## 2015-06-05 DIAGNOSIS — E119 Type 2 diabetes mellitus without complications: Secondary | ICD-10-CM | POA: Diagnosis not present

## 2015-06-05 DIAGNOSIS — I10 Essential (primary) hypertension: Secondary | ICD-10-CM | POA: Diagnosis not present

## 2015-06-05 DIAGNOSIS — Z6833 Body mass index (BMI) 33.0-33.9, adult: Secondary | ICD-10-CM | POA: Diagnosis not present

## 2015-07-09 DIAGNOSIS — Z1231 Encounter for screening mammogram for malignant neoplasm of breast: Secondary | ICD-10-CM | POA: Diagnosis not present

## 2015-07-09 DIAGNOSIS — Z803 Family history of malignant neoplasm of breast: Secondary | ICD-10-CM | POA: Diagnosis not present

## 2015-08-08 ENCOUNTER — Encounter: Payer: Self-pay | Admitting: Gynecology

## 2015-08-08 ENCOUNTER — Ambulatory Visit (INDEPENDENT_AMBULATORY_CARE_PROVIDER_SITE_OTHER): Payer: BLUE CROSS/BLUE SHIELD | Admitting: Gynecology

## 2015-08-08 VITALS — BP 132/90 | Ht 62.0 in | Wt 185.0 lb

## 2015-08-08 DIAGNOSIS — Z01419 Encounter for gynecological examination (general) (routine) without abnormal findings: Secondary | ICD-10-CM

## 2015-08-08 DIAGNOSIS — Z8541 Personal history of malignant neoplasm of cervix uteri: Secondary | ICD-10-CM | POA: Diagnosis not present

## 2015-08-08 NOTE — Patient Instructions (Signed)
Colonoscopy A colonoscopy is an exam to look at the entire large intestine (colon). This exam can help find problems such as tumors, polyps, inflammation, and areas of bleeding. The exam takes about 1 hour.  LET YOUR HEALTH CARE PROVIDER KNOW ABOUT:   Any allergies you have.  All medicines you are taking, including vitamins, herbs, eye drops, creams, and over-the-counter medicines.  Previous problems you or members of your family have had with the use of anesthetics.  Any blood disorders you have.  Previous surgeries you have had.  Medical conditions you have. RISKS AND COMPLICATIONS  Generally, this is a safe procedure. However, as with any procedure, complications can occur. Possible complications include:  Bleeding.  Tearing or rupture of the colon wall.  Reaction to medicines given during the exam.  Infection (rare). BEFORE THE PROCEDURE   Ask your health care provider about changing or stopping your regular medicines.  You may be prescribed an oral bowel prep. This involves drinking a large amount of medicated liquid, starting the day before your procedure. The liquid will cause you to have multiple loose stools until your stool is almost clear or light green. This cleans out your colon in preparation for the procedure.  Do not eat or drink anything else once you have started the bowel prep, unless your health care provider tells you it is safe to do so.  Arrange for someone to drive you home after the procedure. PROCEDURE   You will be given medicine to help you relax (sedative).  You will lie on your side with your knees bent.  A long, flexible tube with a light and camera on the end (colonoscope) will be inserted through the rectum and into the colon. The camera sends video back to a computer screen as it moves through the colon. The colonoscope also releases carbon dioxide gas to inflate the colon. This helps your health care provider see the area better.  During  the exam, your health care provider may take a small tissue sample (biopsy) to be examined under a microscope if any abnormalities are found.  The exam is finished when the entire colon has been viewed. AFTER THE PROCEDURE   Do not drive for 24 hours after the exam.  You may have a small amount of blood in your stool.  You may pass moderate amounts of gas and have mild abdominal cramping or bloating. This is caused by the gas used to inflate your colon during the exam.  Ask when your test results will be ready and how you will get your results. Make sure you get your test results.   This information is not intended to replace advice given to you by your health care provider. Make sure you discuss any questions you have with your health care provider.   Document Released: 01/17/2000 Document Revised: 11/09/2012 Document Reviewed: 09/26/2012 Elsevier Interactive Patient Education 2016 [employer] Inc[employer]  

## 2015-08-08 NOTE — Progress Notes (Signed)
Kelly Beltran [birth year] [id number]   History:    60 y.o.  for annual gyn exam with no complaints today. She is using peppermint oral which she uses when necessary transdermally for hot flashes. Patient's primary care physician is [facility] who is been doing her blood work and her vaccinations are up-to-date. She had a normal bone density study 2016. 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 Patient's mother had breast cancer at the age of 43  Past medical history,surgical history, family history and social history were all reviewed and documented in the EPIC chart.  Gynecologic History No LMP recorded. Patient has had a hysterectomy. Contraception: status post hysterectomy Last Pap: 2016. Results were: normal Last mammogram: 2017. Results were: normal  Obstetric History OB History  Gravida Para Term Preterm AB SAB TAB Ectopic Multiple Living  2 1   1 1    1     # Outcome Date GA Lbr Len/2nd Weight Sex Delivery Anes PTL Lv  2 SAB           1 Para                ROS: A ROS was performed and pertinent positives and negatives are included in the history.  GENERAL: No fevers or chills. HEENT: No change in vision, no earache, sore throat or sinus congestion. NECK: No pain or stiffness. CARDIOVASCULAR: No chest pain or pressure. No palpitations. PULMONARY: No shortness of breath, cough or wheeze. GASTROINTESTINAL: No abdominal pain, nausea, vomiting or diarrhea, melena or bright red blood per rectum. GENITOURINARY: No urinary frequency, urgency, hesitancy or dysuria. MUSCULOSKELETAL: No joint or muscle pain, no back pain, no recent trauma. DERMATOLOGIC: No rash, no itching, no lesions. ENDOCRINE: No polyuria, polydipsia, no heat or cold intolerance. No recent change in weight. HEMATOLOGICAL: No anemia  or easy bruising or bleeding. NEUROLOGIC: No headache, seizures, numbness, tingling or weakness. PSYCHIATRIC: No depression, no loss of interest in normal activity or change in sleep pattern.     Exam: chaperone present  BP 132/90 mmHg  Ht 5\' 2"  (1.575 m)  Wt 185 lb (83.915 kg)  BMI 33.83 kg/m2  Body mass index is 33.83 kg/(m^2).  General appearance : Well developed well nourished female. No acute distress HEENT: Eyes: no retinal hemorrhage or exudates,  Neck supple, trachea midline, no carotid bruits, no thyroidmegaly Lungs: Clear to auscultation, no rhonchi or wheezes, or rib retractions  Heart: Regular rate and rhythm, no murmurs or gallops Breast:Examined in sitting and supine position were symmetrical in appearance, no palpable masses or tenderness,  no skin retraction, no nipple inversion, no nipple discharge, no skin discoloration, no axillary or supraclavicular lymphadenopathy Abdomen: no palpable masses or tenderness, no rebound or guarding Extremities: no edema or skin discoloration or tenderness  Pelvic:  Bartholin, Urethra, Skene Glands: Within normal limits             Vagina: No gross lesions or discharge, atrophic changes  Cervix: Absent  Uterus  absent  Adnexa  Without masses or tenderness  Anus and perineum  normal   Rectovaginal  normal sphincter tone without palpated masses or tenderness             Hemoccult PCP provides     Assessment/Plan:  60 y.o. female for annual exam is receiving her blood work by  her PCP who is been monitoring treating her for type 2 diabetes and hypercholesterolemia. Patient with past history of adenocarcinoma in situ of the cervix was a cecal hysterectomy and Pap smears been normal. Pap smear done today. Patient will need a bone density study next year. Patient will discuss with her PCP to schedule her overdue colonoscopy. We discussed importance of calcium vitamin D and weightbearing exercise for osteoporosis  prevention.   Terrance Mass MD, 10:38 AM 08/08/2015

## 2015-08-08 NOTE — Addendum Note (Signed)
Addended by: Thurnell Garbe A on: 08/08/2015 10:56 AM   Modules accepted: Orders, SmartSet

## 2015-08-09 LAB — PAP IG W/ RFLX HPV ASCU

## 2015-08-12 DIAGNOSIS — D235 Other benign neoplasm of skin of trunk: Secondary | ICD-10-CM | POA: Diagnosis not present

## 2015-08-12 DIAGNOSIS — L821 Other seborrheic keratosis: Secondary | ICD-10-CM | POA: Diagnosis not present

## 2015-08-12 DIAGNOSIS — L918 Other hypertrophic disorders of the skin: Secondary | ICD-10-CM | POA: Diagnosis not present

## 2015-08-12 DIAGNOSIS — D1801 Hemangioma of skin and subcutaneous tissue: Secondary | ICD-10-CM | POA: Diagnosis not present

## 2015-09-05 DIAGNOSIS — E119 Type 2 diabetes mellitus without complications: Secondary | ICD-10-CM | POA: Diagnosis not present

## 2015-09-05 DIAGNOSIS — E782 Mixed hyperlipidemia: Secondary | ICD-10-CM | POA: Diagnosis not present

## 2015-09-05 DIAGNOSIS — I10 Essential (primary) hypertension: Secondary | ICD-10-CM | POA: Diagnosis not present

## 2015-09-05 DIAGNOSIS — Z1389 Encounter for screening for other disorder: Secondary | ICD-10-CM | POA: Diagnosis not present

## 2015-12-06 DIAGNOSIS — I10 Essential (primary) hypertension: Secondary | ICD-10-CM | POA: Diagnosis not present

## 2015-12-06 DIAGNOSIS — E119 Type 2 diabetes mellitus without complications: Secondary | ICD-10-CM | POA: Diagnosis not present

## 2016-05-15 DIAGNOSIS — J019 Acute sinusitis, unspecified: Secondary | ICD-10-CM | POA: Diagnosis not present

## 2016-06-04 DIAGNOSIS — E669 Obesity, unspecified: Secondary | ICD-10-CM | POA: Diagnosis not present

## 2016-06-04 DIAGNOSIS — E119 Type 2 diabetes mellitus without complications: Secondary | ICD-10-CM | POA: Diagnosis not present

## 2016-06-04 DIAGNOSIS — Z6832 Body mass index (BMI) 32.0-32.9, adult: Secondary | ICD-10-CM | POA: Diagnosis not present

## 2016-06-04 DIAGNOSIS — I10 Essential (primary) hypertension: Secondary | ICD-10-CM | POA: Diagnosis not present

## 2016-06-17 ENCOUNTER — Encounter: Payer: Self-pay | Admitting: Gynecology

## 2016-09-02 ENCOUNTER — Encounter: Payer: Self-pay | Admitting: Gynecology

## 2016-09-02 DIAGNOSIS — Z1231 Encounter for screening mammogram for malignant neoplasm of breast: Secondary | ICD-10-CM | POA: Diagnosis not present

## 2016-09-02 DIAGNOSIS — Z803 Family history of malignant neoplasm of breast: Secondary | ICD-10-CM | POA: Diagnosis not present

## 2016-09-04 ENCOUNTER — Ambulatory Visit (INDEPENDENT_AMBULATORY_CARE_PROVIDER_SITE_OTHER): Payer: BLUE CROSS/BLUE SHIELD | Admitting: Gynecology

## 2016-09-04 ENCOUNTER — Encounter: Payer: Self-pay | Admitting: Gynecology

## 2016-09-04 VITALS — BP 126/82 | Ht 62.5 in | Wt 185.0 lb

## 2016-09-04 DIAGNOSIS — Z780 Asymptomatic menopausal state: Secondary | ICD-10-CM

## 2016-09-04 DIAGNOSIS — Z01419 Encounter for gynecological examination (general) (routine) without abnormal findings: Secondary | ICD-10-CM | POA: Diagnosis not present

## 2016-09-04 NOTE — Patient Instructions (Addendum)
Bone Densitometry Bone densitometry is an imaging test that uses a special X-ray to measure the amount of calcium and other minerals in your bones (bone density). This test is also known as a bone mineral density test or dual-energy X-ray absorptiometry (DXA). The test can measure bone density at your hip and your spine. It is similar to having a regular X-ray. You may have this test to:  Diagnose a condition that causes weak or thin bones (osteoporosis).  Predict your risk of a broken bone (fracture).  Determine how well osteoporosis treatment is working.  Tell a health care provider about:  Any allergies you have.  All medicines you are taking, including vitamins, herbs, eye drops, creams, and over-the-counter medicines.  Any problems you or family members have had with anesthetic medicines.  Any blood disorders you have.  Any surgeries you have had.  Any medical conditions you have.  Possibility of pregnancy.  Any other medical test you had within the previous 14 days that used contrast material. What are the risks? Generally, this is a safe procedure. However, problems can occur and may include the following:  This test exposes you to a very small amount of radiation.  The risks of radiation exposure may be greater to unborn children.  What happens before the procedure?  Do not take any calcium supplements for 24 hours before having the test. You can otherwise eat and drink what you usually do.  Take off all metal jewelry, eyeglasses, dental appliances, and any other metal objects. What happens during the procedure?  You may lie on an exam table. There will be an X-ray generator below you and an imaging device above you.  Other devices, such as boxes or braces, may be used to position your body properly for the scan.  You will need to lie still while the machine slowly scans your body.  The images will show up on a computer monitor. What happens after the  procedure? You may need more testing at a later time. This information is not intended to replace advice given to you by your health care provider. Make sure you discuss any questions you have with your health care provider. Document Released: 02/11/2004 Document Revised: 06/27/2015 Document Reviewed: 06/29/2013 Elsevier Interactive Patient Education  2018 [employer].   Colonoscopy, Adult A colonoscopy is an exam to look at the entire large intestine. During the exam, a lubricated, bendable tube is inserted into the anus and then passed into the rectum, colon, and other parts of the large intestine. A colonoscopy is often done as a part of normal colorectal screening or in response to certain symptoms, such as anemia, persistent diarrhea, abdominal pain, and blood in the stool. The exam can help screen for and diagnose medical problems, including:  Tumors.  Polyps.  Inflammation.  Areas of bleeding.  Tell a health care provider about:  Any allergies you have.  All medicines you are taking, including vitamins, herbs, eye drops, creams, and over-the-counter medicines.  Any problems you or family members have had with anesthetic medicines.  Any blood disorders you have.  Any surgeries you have had.  Any medical conditions you have.  Any problems you have had passing stool. What are the risks? Generally, this is a safe procedure. However, problems may occur, including:  Bleeding.  A tear in the intestine.  A reaction to medicines given during the exam.  Infection (rare).  What happens before the procedure? Eating and drinking restrictions Follow instructions from your health  care provider about eating and drinking, which may include:  A few days before the procedure - follow a low-fiber diet. Avoid nuts, seeds, dried fruit, raw fruits, and vegetables.  1-3 days before the procedure - follow a clear liquid diet. Drink only clear liquids, such as clear broth or  bouillon, black coffee or tea, clear juice, clear soft drinks or sports drinks, gelatin dessert, and popsicles. Avoid any liquids that contain red or purple dye.  On the day of the procedure - do not eat or drink anything during the 2 hours before the procedure, or within the time period that your health care provider recommends.  Bowel prep If you were prescribed an oral bowel prep to clean out your colon:  Take it as told by your health care provider. Starting the day before your procedure, you will need to drink a large amount of medicated liquid. The liquid will cause you to have multiple loose stools until your stool is almost clear or light green.  If your skin or anus gets irritated from diarrhea, you may use these to relieve the irritation: ? Medicated wipes, such as adult wet wipes with aloe and vitamin E. ? A skin soothing-product like petroleum jelly.  If you vomit while drinking the bowel prep, take a break for up to 60 minutes and then begin the bowel prep again. If vomiting continues and you cannot take the bowel prep without vomiting, call your health care provider.  General instructions  Ask your health care provider about changing or stopping your regular medicines. This is especially important if you are taking diabetes medicines or blood thinners.  Plan to have someone take you home from the hospital or clinic. What happens during the procedure?  An IV tube may be inserted into one of your veins.  You will be given medicine to help you relax (sedative).  To reduce your risk of infection: ? Your health care team will wash or sanitize their hands. ? Your anal area will be washed with soap.  You will be asked to lie on your side with your knees bent.  Your health care provider will lubricate a long, thin, flexible tube. The tube will have a camera and a light on the end.  The tube will be inserted into your anus.  The tube will be gently eased through your rectum  and colon.  Air will be delivered into your colon to keep it open. You may feel some pressure or cramping.  The camera will be used to take images during the procedure.  A small tissue sample may be removed from your body to be examined under a microscope (biopsy). If any potential problems are found, the tissue will be sent to a lab for testing.  If small polyps are found, your health care provider may remove them and have them checked for cancer cells.  The tube that was inserted into your anus will be slowly removed. The procedure may vary among health care providers and hospitals. What happens after the procedure?  Your blood pressure, heart rate, breathing rate, and blood oxygen level will be monitored until the medicines you were given have worn off.  Do not drive for 24 hours after the exam.  You may have a small amount of blood in your stool.  You may pass gas and have mild abdominal cramping or bloating due to the air that was used to inflate your colon during the exam.  It is up to you to  get the results of your procedure. Ask your health care provider, or the department performing the procedure, when your results will be ready. This information is not intended to replace advice given to you by your health care provider. Make sure you discuss any questions you have with your health care provider. Document Released: 01/17/2000 Document Revised: 11/20/2015 Document Reviewed: 04/02/2015 Elsevier Interactive Patient Education  2018 [employer].

## 2016-09-04 NOTE — Addendum Note (Signed)
Addended by: Dorothyann Gibbs on: 09/04/2016 11:45 AM   Modules accepted: Orders

## 2016-09-04 NOTE — Progress Notes (Signed)
Kelly Beltran July 28, 1955 [id number]   History:    61 y.o.  for annual gyn exam with no complaints today. Patient is no longer using Vagifem as a vaginal lubricant. She is using peppermint oral which she uses when necessary transdermally for hot flashes. Patient's primary care physician is in [facility] who is been doing her blood work and her vaccinations are up-to-date. She had a normal bone density study 2016. 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 Patient's mother had breast cancer at the age of 34 Past medical history,surgical history, family history and social history were all reviewed and documented in the EPIC chart.  Gynecologic History No LMP recorded. Patient has had a hysterectomy. Contraception: status post hysterectomy Last Pap: 2017. Results were: normal Last mammogram: 2018. Results were: Results pending  Obstetric History OB History  Gravida Para Term Preterm AB Living  2 1     1 1   SAB TAB Ectopic Multiple Live Births  1            # Outcome Date GA Lbr Len/2nd Weight Sex Delivery Anes PTL Lv  2 SAB           1 Para                ROS: A ROS was performed and pertinent positives and negatives are included in the history.  GENERAL: No fevers or chills. HEENT: No change in vision, no earache, sore throat or sinus congestion. NECK: No pain or stiffness. CARDIOVASCULAR: No chest pain or pressure. No palpitations. PULMONARY: No shortness of breath, cough or wheeze. GASTROINTESTINAL: No abdominal pain, nausea, vomiting or diarrhea, melena or bright red blood per rectum. GENITOURINARY: No urinary frequency, urgency, hesitancy or dysuria. MUSCULOSKELETAL: No joint or muscle pain, no back pain, no recent trauma. DERMATOLOGIC: No rash, no itching, no lesions. ENDOCRINE: No polyuria,  polydipsia, no heat or cold intolerance. No recent change in weight. HEMATOLOGICAL: No anemia or easy bruising or bleeding. NEUROLOGIC: No headache, seizures, numbness, tingling or weakness. PSYCHIATRIC: No depression, no loss of interest in normal activity or change in sleep pattern.     Exam: chaperone present  BP 126/82   Ht 5' 2.5" (1.588 m)   Wt 185 lb (83.9 kg)   BMI 33.30 kg/m   Body mass index is 33.3 kg/m.  General appearance : Well developed well nourished female. No acute distress HEENT: Eyes: no retinal hemorrhage or exudates,  Neck supple, trachea midline, no carotid bruits, no thyroidmegaly Lungs: Clear to auscultation, no rhonchi or wheezes, or rib retractions  Heart: Regular rate and rhythm, no murmurs or gallops Breast:Examined in sitting and supine position were symmetrical in appearance, no palpable masses or tenderness,  no skin retraction, no nipple inversion, no nipple discharge, no skin discoloration, no axillary or supraclavicular lymphadenopathy Abdomen: no palpable masses or tenderness, no rebound or guarding Extremities: no edema or skin discoloration or tenderness  Pelvic:  Bartholin, Urethra, Skene Glands: Within normal limits             Vagina: No gross lesions or discharge  Cervix: Absent  Uterus  absent  Adnexa  Without masses or tenderness  Anus and perineum  normal   Rectovaginal  normal sphincter tone without palpated masses or tenderness  Hemoccult will have colonoscopy this year     Assessment/Plan:  61 y.o. female for annual exam who prior to her hysterectomy had cervical conization 1991 for adenocarcinoma of the cervix. Subsequent Pap smears have been normal. Pap smear done today. Patient to schedule bone density study. I provided her with the name of gastroenterologist so she can schedule her overdue colonoscopy. Patient to schedule her bone density study. We discussed importance of calcium and vitamin D and weightbearing  exercises for osteoporosis prevention.   Terrance Mass MD, 11:26 AM 09/04/2016

## 2016-09-08 LAB — PAP, TP IMAGING W/ HPV RNA, RFLX HPV TYPE 16,18/45: HPV MRNA, HIGH RISK: NOT DETECTED

## 2016-12-11 DIAGNOSIS — Z1331 Encounter for screening for depression: Secondary | ICD-10-CM | POA: Diagnosis not present

## 2016-12-11 DIAGNOSIS — E782 Mixed hyperlipidemia: Secondary | ICD-10-CM | POA: Diagnosis not present

## 2016-12-11 DIAGNOSIS — E119 Type 2 diabetes mellitus without complications: Secondary | ICD-10-CM | POA: Diagnosis not present

## 2016-12-11 DIAGNOSIS — K219 Gastro-esophageal reflux disease without esophagitis: Secondary | ICD-10-CM | POA: Diagnosis not present

## 2016-12-11 DIAGNOSIS — I10 Essential (primary) hypertension: Secondary | ICD-10-CM | POA: Diagnosis not present

## 2017-02-08 ENCOUNTER — Ambulatory Visit (INDEPENDENT_AMBULATORY_CARE_PROVIDER_SITE_OTHER): Payer: BLUE CROSS/BLUE SHIELD | Admitting: Physician Assistant

## 2017-02-08 ENCOUNTER — Encounter (INDEPENDENT_AMBULATORY_CARE_PROVIDER_SITE_OTHER): Payer: Self-pay | Admitting: Physician Assistant

## 2017-02-08 ENCOUNTER — Ambulatory Visit (INDEPENDENT_AMBULATORY_CARE_PROVIDER_SITE_OTHER): Payer: Self-pay

## 2017-02-08 DIAGNOSIS — M25562 Pain in left knee: Secondary | ICD-10-CM | POA: Diagnosis not present

## 2017-02-08 NOTE — Progress Notes (Signed)
Office Visit Note   Patient: Kelly Beltran           Date of Birth: 10/13/1955           MRN: [id number] Visit Date: 02/08/2017              Requested by: [name], PA-C [address] [place], [place] [address] PCP: [name], PA-C   Assessment & Plan: Visit Diagnoses:  1. Acute pain of left knee     Plan: Quad strengthening exercises shown and discussed with patient.  She is stated that she would also speak with her husband's therapist about any other exercises she can do to strengthen her knees.  Offered cortisone injection she defers.  She does not want to take any NSAIDs if she can help with but is willing to take over-the-counter ibuprofen as needed.  Also discussed with her to Tumeric as a naturally antiinflammatory.   Follow-Up Instructions: Return in about 4 weeks (around 03/08/2017).   Orders:  Orders Placed This Encounter  Procedures  . XR KNEE 3 VIEW LEFT   No orders of the defined types were placed in this encounter.     Procedures: No procedures performed   Clinical Data: No additional findings.   Subjective: Left knee pain  HPI Kelly Beltran is a patient we have not seen in several years.  She comes in with left knee pain for the past 3 weeks.  States that she twisted her knees and 3 weeks ago has had pain in the knee since then.  States she feels like a lesser pain is coming from underneath the kneecap where there is a catch.  She is having no active catching locking painful popping of the knee though.  She is diabetic and reports her last seen 6.8  no fevers chills shortness breath or chest pain pain.  Review of Systems No chest pain, shortness of breath, calf pain fevers or chills  Objective: Vital Signs: There were no vitals taken for this visit.  Physical Exam  Constitutional: She is oriented to person, place, and time. She appears well-developed and well-nourished. No distress.  Pulmonary/Chest: Effort normal.  Neurological: She is  alert and oriented to person, place, and time.  Skin: She is not diaphoretic.  Psychiatric: She has a normal mood and affect.    Ortho Exam Bilateral knees she has good range of motion.  She has tenderness along the medial aspect of the left knee.  No instability valgus varus stressing.  No effusion abnormal warmth or erythema of either knee.  Slight crepitus bilateral knees patellofemoral region. Specialty Comments:  No specialty comments available.  Imaging: Xr Knee 3 View Left  Result Date: 02/08/2017 AP lateral and sunrise view left knee: Moderate severe medial compartmental arthritis and mild patellofemoral changes.  No acute fracture.  Knee is well located.    PMFS History: Patient Active Problem List   Diagnosis Date Noted  . Menopausal state 03/30/2012  . 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 Elevated cholesterol   . Kidney stone    Past Medical History:  Diagnosis Date  . Adenocarcinoma of cervix ([place])    IN SITU  . Cancer ([place])   . Diabetes mellitus    TYPE 2  . Elevated cholesterol   . Fibroid   . Hypertension   . Kidney stone     Family History  Problem Relation Age of Onset  . Diabetes Father   . Hypertension Father   .  Diabetes Mother   . Hypertension Mother   . Breast cancer Mother   . Cancer Mother        BLADDER  . Atrial fibrillation Mother     Past Surgical History:  Procedure Laterality Date  . ABDOMINAL HYSTERECTOMY  1997   REPAIR OF CYSTOTOMY  . CERVIX LESION DESTRUCTION    . CHOLECYSTECTOMY    . COLPOSCOPY    . LITHOTRIPSY     Social History   Occupational History  . Not on file  Tobacco Use  . Smoking status: Never Smoker  . Smokeless tobacco: Never Used  Substance and Sexual Activity  . Alcohol use: Yes    Alcohol/week: 0.0 oz    Comment: rare  . Drug use: No  . Sexual activity: Not Currently    Birth control/protection: Surgical

## 2017-04-08 DIAGNOSIS — M159 Polyosteoarthritis, unspecified: Secondary | ICD-10-CM | POA: Diagnosis not present

## 2017-04-08 DIAGNOSIS — E119 Type 2 diabetes mellitus without complications: Secondary | ICD-10-CM | POA: Diagnosis not present

## 2017-04-08 DIAGNOSIS — E782 Mixed hyperlipidemia: Secondary | ICD-10-CM | POA: Diagnosis not present

## 2017-04-08 DIAGNOSIS — I10 Essential (primary) hypertension: Secondary | ICD-10-CM | POA: Diagnosis not present

## 2017-07-08 DIAGNOSIS — M25551 Pain in right hip: Secondary | ICD-10-CM | POA: Diagnosis not present

## 2017-07-08 DIAGNOSIS — M25662 Stiffness of left knee, not elsewhere classified: Secondary | ICD-10-CM | POA: Diagnosis not present

## 2017-07-08 DIAGNOSIS — M25672 Stiffness of left ankle, not elsewhere classified: Secondary | ICD-10-CM | POA: Diagnosis not present

## 2017-07-08 DIAGNOSIS — M25572 Pain in left ankle and joints of left foot: Secondary | ICD-10-CM | POA: Diagnosis not present

## 2017-07-12 DIAGNOSIS — I10 Essential (primary) hypertension: Secondary | ICD-10-CM | POA: Diagnosis not present

## 2017-07-12 DIAGNOSIS — K219 Gastro-esophageal reflux disease without esophagitis: Secondary | ICD-10-CM | POA: Diagnosis not present

## 2017-07-12 DIAGNOSIS — E119 Type 2 diabetes mellitus without complications: Secondary | ICD-10-CM | POA: Diagnosis not present

## 2017-07-12 DIAGNOSIS — E782 Mixed hyperlipidemia: Secondary | ICD-10-CM | POA: Diagnosis not present

## 2017-07-12 DIAGNOSIS — Z79899 Other long term (current) drug therapy: Secondary | ICD-10-CM | POA: Diagnosis not present

## 2017-08-09 DIAGNOSIS — M2550 Pain in unspecified joint: Secondary | ICD-10-CM | POA: Diagnosis not present

## 2017-08-09 DIAGNOSIS — Z6830 Body mass index (BMI) 30.0-30.9, adult: Secondary | ICD-10-CM | POA: Diagnosis not present

## 2017-09-06 DIAGNOSIS — Z803 Family history of malignant neoplasm of breast: Secondary | ICD-10-CM | POA: Diagnosis not present

## 2017-09-06 DIAGNOSIS — Z1231 Encounter for screening mammogram for malignant neoplasm of breast: Secondary | ICD-10-CM | POA: Diagnosis not present

## 2017-10-25 DIAGNOSIS — M2550 Pain in unspecified joint: Secondary | ICD-10-CM | POA: Diagnosis not present

## 2017-10-25 DIAGNOSIS — M150 Primary generalized (osteo)arthritis: Secondary | ICD-10-CM | POA: Diagnosis not present

## 2017-11-25 ENCOUNTER — Ambulatory Visit: Payer: BLUE CROSS/BLUE SHIELD | Admitting: Obstetrics & Gynecology

## 2017-11-25 ENCOUNTER — Encounter (INDEPENDENT_AMBULATORY_CARE_PROVIDER_SITE_OTHER): Payer: Self-pay

## 2017-11-25 ENCOUNTER — Encounter: Payer: Self-pay | Admitting: Obstetrics & Gynecology

## 2017-11-25 VITALS — BP 134/76 | Ht 62.0 in | Wt 172.0 lb

## 2017-11-25 DIAGNOSIS — Z90710 Acquired absence of both cervix and uterus: Secondary | ICD-10-CM | POA: Diagnosis not present

## 2017-11-25 DIAGNOSIS — Z01419 Encounter for gynecological examination (general) (routine) without abnormal findings: Secondary | ICD-10-CM | POA: Diagnosis not present

## 2017-11-25 DIAGNOSIS — Z780 Asymptomatic menopausal state: Secondary | ICD-10-CM | POA: Diagnosis not present

## 2017-11-25 DIAGNOSIS — D069 Carcinoma in situ of cervix, unspecified: Secondary | ICD-10-CM

## 2017-11-25 DIAGNOSIS — Z1272 Encounter for screening for malignant neoplasm of vagina: Secondary | ICD-10-CM

## 2017-11-25 NOTE — Progress Notes (Signed)
Kelly Beltran October 25, 1955 [id number]   History:    62 y.o. G2P1A1L1 married  RP:  Established patient presenting for annual gyn exam   HPI: Menopause, well on no hormone replacement therapy.  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  Pap test normal since the hysterectomy.  No pelvic pain.  Urine and bowel movements normal.  Breasts normal.  Body mass index 31.46.  Health labs with family physician.  Past medical history,surgical history, family history and social history were all reviewed and documented in the EPIC chart.  Gynecologic History No LMP recorded. Patient has had a hysterectomy. Contraception: status post hysterectomy Last Pap: 2017. Results were: normal Last mammogram: 2018. Results were: normal Bone Density: 06/2014 Normal Colonoscopy:  Uncertain  Obstetric History OB History  Gravida Para Term Preterm AB Living  2 1     1 1   SAB TAB Ectopic Multiple Live Births  1            # Outcome Date GA Lbr Len/2nd Weight Sex Delivery Anes PTL Lv  2 SAB           1 Para              ROS: A ROS was performed and pertinent positives and negatives are included in the history.  GENERAL: No fevers or chills. HEENT: No change in vision, no earache, sore throat or sinus congestion. NECK: No pain or stiffness. CARDIOVASCULAR: No chest pain or pressure. No palpitations. PULMONARY: No shortness of breath, cough or wheeze. GASTROINTESTINAL: No abdominal pain, nausea, vomiting or diarrhea, melena or bright red blood per rectum. GENITOURINARY: No urinary frequency, urgency, hesitancy or dysuria. MUSCULOSKELETAL: No joint or muscle pain, no back pain, no recent trauma. DERMATOLOGIC: No rash, no itching, no lesions. ENDOCRINE: No polyuria, polydipsia, no heat or cold intolerance. No recent change in weight. HEMATOLOGICAL: No anemia or easy bruising or bleeding. NEUROLOGIC: No headache, seizures, numbness, tingling or weakness. PSYCHIATRIC: No  depression, no loss of interest in normal activity or change in sleep pattern.     Exam:   BP 134/76   Ht 5\' 2"  (1.575 m)   Wt 172 lb (78 kg)   BMI 31.46 kg/m   Body mass index is 31.46 kg/m.  General appearance : Well developed well nourished female. No acute distress HEENT: Eyes: no retinal hemorrhage or exudates,  Neck supple, trachea midline, no carotid bruits, no thyroidmegaly Lungs: Clear to auscultation, no rhonchi or wheezes, or rib retractions  Heart: Regular rate and rhythm, no murmurs or gallops Breast:Examined in sitting and supine position were symmetrical in appearance, no palpable masses or tenderness,  no skin retraction, no nipple inversion, no nipple discharge, no skin discoloration, no axillary or supraclavicular lymphadenopathy Abdomen: no palpable masses or tenderness, no rebound or guarding Extremities: no edema or skin discoloration or tenderness  Pelvic: Vulva: Normal             Vagina: No gross lesions or discharge.  Pap reflex done.  Cervix/Uterus absent  Adnexa  Without masses or tenderness  Anus: Normal   Assessment/Plan:  61 y.o. female for annual exam   1. Encounter for Papanicolaou smear of vagina as part of routine gynecological examination Gynecologic exam status post total hysterectomy.  Pap test reflex done on the vaginal vault.  Breast exam normal. - Pap IG w/ reflex to HPV when ASC-U  2. Postmenopausal Well on no hormone replacement therapy.  Recommend vitamin D  supplements, calcium intake of 1.5 g/day and regular weightbearing physical activity.  Bone density was normal in May 2016.  Will repeat at 5 years.  3. S/P total hysterectomy  4. Adenocarcinoma in situ (AIS) of uterine cervix Status post total hysterectomy.  Pap test normal since then.  Pap reflex done today.   Princess Bruins MD, 2:41 PM 11/25/2017

## 2017-11-26 LAB — PAP IG W/ RFLX HPV ASCU

## 2017-11-28 ENCOUNTER — Encounter: Payer: Self-pay | Admitting: Obstetrics & Gynecology

## 2017-11-28 NOTE — Patient Instructions (Signed)
1. Encounter for Papanicolaou smear of vagina as part of routine gynecological examination Gynecologic exam status post total hysterectomy.  Pap test reflex done on the vaginal vault.  Breast exam normal. - Pap IG w/ reflex to HPV when ASC-U  2. Postmenopausal Well on no hormone replacement therapy.  Recommend vitamin D supplements, calcium intake of 1.5 g/day and regular weightbearing physical activity.  Bone density was normal in May 2016.  Will repeat at 5 years.  3. S/P total hysterectomy  4. Adenocarcinoma in situ (AIS) of uterine cervix Status post total hysterectomy.  Pap test normal since then.  Pap reflex done today.  Kelly Beltran, it was a pleasure seeing you today!  I will inform you of your results as soon as they are available.

## 2018-01-18 DIAGNOSIS — E119 Type 2 diabetes mellitus without complications: Secondary | ICD-10-CM | POA: Diagnosis not present

## 2018-01-18 DIAGNOSIS — I10 Essential (primary) hypertension: Secondary | ICD-10-CM | POA: Diagnosis not present

## 2018-01-18 DIAGNOSIS — E782 Mixed hyperlipidemia: Secondary | ICD-10-CM | POA: Diagnosis not present

## 2018-01-18 DIAGNOSIS — K219 Gastro-esophageal reflux disease without esophagitis: Secondary | ICD-10-CM | POA: Diagnosis not present

## 2018-06-28 DIAGNOSIS — H00022 Hordeolum internum right lower eyelid: Secondary | ICD-10-CM | POA: Diagnosis not present

## 2018-07-20 DIAGNOSIS — M159 Polyosteoarthritis, unspecified: Secondary | ICD-10-CM | POA: Diagnosis not present

## 2018-07-20 DIAGNOSIS — E782 Mixed hyperlipidemia: Secondary | ICD-10-CM | POA: Diagnosis not present

## 2018-07-20 DIAGNOSIS — E119 Type 2 diabetes mellitus without complications: Secondary | ICD-10-CM | POA: Diagnosis not present

## 2018-07-20 DIAGNOSIS — I10 Essential (primary) hypertension: Secondary | ICD-10-CM | POA: Diagnosis not present

## 2018-09-29 DIAGNOSIS — H1013 Acute atopic conjunctivitis, bilateral: Secondary | ICD-10-CM | POA: Diagnosis not present

## 2018-10-13 DIAGNOSIS — I10 Essential (primary) hypertension: Secondary | ICD-10-CM | POA: Diagnosis not present

## 2018-10-13 DIAGNOSIS — J019 Acute sinusitis, unspecified: Secondary | ICD-10-CM | POA: Diagnosis not present

## 2018-10-13 DIAGNOSIS — E782 Mixed hyperlipidemia: Secondary | ICD-10-CM | POA: Diagnosis not present

## 2018-10-13 DIAGNOSIS — E119 Type 2 diabetes mellitus without complications: Secondary | ICD-10-CM | POA: Diagnosis not present

## 2018-10-13 DIAGNOSIS — Z1331 Encounter for screening for depression: Secondary | ICD-10-CM | POA: Diagnosis not present

## 2018-11-30 ENCOUNTER — Encounter: Payer: BLUE CROSS/BLUE SHIELD | Admitting: Obstetrics & Gynecology

## 2019-01-18 DIAGNOSIS — I10 Essential (primary) hypertension: Secondary | ICD-10-CM | POA: Diagnosis not present

## 2019-01-18 DIAGNOSIS — K219 Gastro-esophageal reflux disease without esophagitis: Secondary | ICD-10-CM | POA: Diagnosis not present

## 2019-01-18 DIAGNOSIS — E782 Mixed hyperlipidemia: Secondary | ICD-10-CM | POA: Diagnosis not present

## 2019-01-18 DIAGNOSIS — E119 Type 2 diabetes mellitus without complications: Secondary | ICD-10-CM | POA: Diagnosis not present

## 2019-03-14 ENCOUNTER — Other Ambulatory Visit: Payer: Self-pay | Admitting: Urology

## 2019-03-14 DIAGNOSIS — R109 Unspecified abdominal pain: Secondary | ICD-10-CM

## 2019-03-16 ENCOUNTER — Ambulatory Visit (HOSPITAL_COMMUNITY)
Admission: RE | Admit: 2019-03-16 | Discharge: 2019-03-16 | Disposition: A | Discharge status: Home / Self Care | Payer: 59 | Source: Ambulatory Visit | Attending: Urology | Admitting: Urology

## 2019-03-16 ENCOUNTER — Other Ambulatory Visit: Payer: Self-pay

## 2019-03-16 DIAGNOSIS — R109 Unspecified abdominal pain: Secondary | ICD-10-CM | POA: Diagnosis not present

## 2019-05-11 ENCOUNTER — Other Ambulatory Visit: Payer: Self-pay

## 2019-05-12 ENCOUNTER — Ambulatory Visit (INDEPENDENT_AMBULATORY_CARE_PROVIDER_SITE_OTHER): Payer: 59 | Admitting: Obstetrics & Gynecology

## 2019-05-12 ENCOUNTER — Encounter: Payer: Self-pay | Admitting: Obstetrics & Gynecology

## 2019-05-12 VITALS — BP 136/84 | Ht 62.0 in | Wt 173.0 lb

## 2019-05-12 DIAGNOSIS — Z8541 Personal history of malignant neoplasm of cervix uteri: Secondary | ICD-10-CM

## 2019-05-12 DIAGNOSIS — Z13820 Encounter for screening for osteoporosis: Secondary | ICD-10-CM

## 2019-05-12 DIAGNOSIS — Z780 Asymptomatic menopausal state: Secondary | ICD-10-CM | POA: Diagnosis not present

## 2019-05-12 DIAGNOSIS — Z01419 Encounter for gynecological examination (general) (routine) without abnormal findings: Secondary | ICD-10-CM | POA: Diagnosis not present

## 2019-05-12 DIAGNOSIS — Z1272 Encounter for screening for malignant neoplasm of vagina: Secondary | ICD-10-CM

## 2019-05-12 DIAGNOSIS — Z6831 Body mass index (BMI) 31.0-31.9, adult: Secondary | ICD-10-CM

## 2019-05-12 DIAGNOSIS — D069 Carcinoma in situ of cervix, unspecified: Secondary | ICD-10-CM

## 2019-05-12 DIAGNOSIS — E6609 Other obesity due to excess calories: Secondary | ICD-10-CM

## 2019-05-12 DIAGNOSIS — Z90710 Acquired absence of both cervix and uterus: Secondary | ICD-10-CM | POA: Diagnosis not present

## 2019-05-12 NOTE — Addendum Note (Signed)
Addended by: Thurnell Garbe A on: 05/12/2019 11:41 AM   Modules accepted: Orders

## 2019-05-12 NOTE — Progress Notes (Signed)
AMYNA KOWALL 04-27-1955 [id number]   History:    64 y.o. G2P1A1L1 married  RP:  Established patient presenting for annual gyn exam   HPI: Menopause, well on no hormone replacement therapy.  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  Pap test normal since the hysterectomy.  No pelvic pain.  Urine and bowel movements normal.  Passing kidney stones x 2 weeks, followed by Urology.  Breasts normal, but recent screening mammo showed a 4 mm nodule for which a Rt Breast Bx is scheduled 4/13th.  Body mass index 31.64.  Health labs with family physician.  BD normal 06/2014.  Doing Cologard.   Past medical history,surgical history, family history and social history were all reviewed and documented in the EPIC chart.  Gynecologic History No LMP recorded. Patient has had a hysterectomy.  Obstetric History OB History  Gravida Para Term Preterm AB Living  2 1     1 1   SAB TAB Ectopic Multiple Live Births  1            # Outcome Date GA Lbr Len/2nd Weight Sex Delivery Anes PTL Lv  2 SAB           1 Para              ROS: A ROS was performed and pertinent positives and negatives are included in the history.  GENERAL: No fevers or chills. HEENT: No change in vision, no earache, sore throat or sinus congestion. NECK: No pain or stiffness. CARDIOVASCULAR: No chest pain or pressure. No palpitations. PULMONARY: No shortness of breath, cough or wheeze. GASTROINTESTINAL: No abdominal pain, nausea, vomiting or diarrhea, melena or bright red blood per rectum. GENITOURINARY: No urinary frequency, urgency, hesitancy or dysuria. MUSCULOSKELETAL: No joint or muscle pain, no back pain, no recent trauma. DERMATOLOGIC: No rash, no itching, no lesions. ENDOCRINE: No polyuria, polydipsia, no heat or cold intolerance. No recent change in weight. HEMATOLOGICAL: No anemia or easy bruising or bleeding. NEUROLOGIC: No headache, seizures, numbness, tingling or weakness.  PSYCHIATRIC: No depression, no loss of interest in normal activity or change in sleep pattern.     Exam:   BP 136/84   Ht 5\' 2"  (1.575 m)   Wt 173 lb (78.5 kg)   BMI 31.64 kg/m   Body mass index is 31.64 kg/m.  General appearance : Well developed well nourished female. No acute distress HEENT: Eyes: no retinal hemorrhage or exudates,  Neck supple, trachea midline, no carotid bruits, no thyroidmegaly Lungs: Clear to auscultation, no rhonchi or wheezes, or rib retractions  Heart: Regular rate and rhythm, no murmurs or gallops Breast:Examined in sitting and supine position were symmetrical in appearance, no palpable masses or tenderness,  no skin retraction, no nipple inversion, no nipple discharge, no skin discoloration, no axillary or supraclavicular lymphadenopathy Abdomen: no palpable masses or tenderness, no rebound or guarding Extremities: no edema or skin discoloration or tenderness  Pelvic: Vulva: Normal             Vagina: No gross lesions or discharge.  Pap reflex done.  Cervix/Uterus absent  Adnexa  Without masses or tenderness  Anus: Normal   Assessment/Plan:  64 y.o. female for annual exam   1. Encounter for Papanicolaou smear of vagina as part of routine gynecological examination Gynecologic exam status post total hysterectomy in menopause.  History of adenocarcinoma in situ of the uterine cervix.  Pap test done on the vaginal vault.  Breast  exam normal.  Recent mammogram at [facility] showed a 4 mm nodule on the right breast.  A right breast biopsy is scheduled for April 13.  Left breast was normal.  Patient is doing Cologuard with family physician.  Health labs with family physician.  Followed by urology for kidney stones.  2. Postmenopausal Well on no hormone replacement therapy.  Last bone density May 2016 was normal.  We will repeat a bone density now.   3. Screening for osteoporosis Schedule bone density here now. - DG Bone Density; Future  4. S/P total  hysterectomy  5. Adenocarcinoma in situ (AIS) of uterine cervix Pap test on the vaginal vault done today.  6. Class 1 obesity due to excess calories without serious comorbidity with body mass index (BMI) of 31.0 to 31.9 in adult Recommend a lower calorie/carb diet such as [employer].  Continue with physical activity using the stationary bike 5 times a week and light weightlifting every 2 days.  Other orders - Zinc Sulfate (ZINC 15 PO); Take by mouth.  Princess Bruins MD, 8:39 AM 05/12/2019

## 2019-05-12 NOTE — Patient Instructions (Addendum)
1. Encounter for Papanicolaou smear of vagina as part of routine gynecological examination Gynecologic exam status post total hysterectomy in menopause.  History of adenocarcinoma in situ of the uterine cervix.  Pap test done on the vaginal vault.  Breast exam normal.  Recent mammogram at [facility] showed a 4 mm nodule on the right breast.  A right breast biopsy is scheduled for April 13.  Left breast was normal.  Patient is doing Cologuard with family physician.  Health labs with family physician.  Followed by urology for kidney stones.  2. Postmenopausal Well on no hormone replacement therapy.   3. Screening for osteoporosis Last bone density May 2016 was normal.  We will repeat a bone density now.  - DG Bone Density; Future  4. S/P Total Hysterectomy  5. Adenocarcinoma in situ (AIS) of uterine cervix Pap test on the vaginal vault done today.  6. Class 1 obesity due to excess calories without serious comorbidity with body mass index (BMI) of 31.0 to 31.9 in adult Recommend a lower calorie/carb diet such as [employer].  Continue with physical activity using the stationary bike 5 times a week and light weightlifting every 2 days.  Other orders - Zinc Sulfate (ZINC 15 PO); Take by mouth.  Kelly Beltran, it was a pleasure seeing you today!  I will inform you of your results as soon as they are available.

## 2019-05-15 LAB — PAP IG W/ RFLX HPV ASCU

## 2019-05-16 ENCOUNTER — Other Ambulatory Visit: Payer: Self-pay | Admitting: Radiology

## 2019-05-17 ENCOUNTER — Encounter: Payer: Self-pay | Admitting: *Deleted

## 2019-05-18 ENCOUNTER — Telehealth: Payer: Self-pay | Admitting: Hematology and Oncology

## 2019-05-18 ENCOUNTER — Encounter: Payer: Self-pay | Admitting: *Deleted

## 2019-05-18 ENCOUNTER — Other Ambulatory Visit: Payer: Self-pay | Admitting: *Deleted

## 2019-05-18 DIAGNOSIS — C50411 Malignant neoplasm of upper-outer quadrant of right female breast: Secondary | ICD-10-CM | POA: Insufficient documentation

## 2019-05-18 DIAGNOSIS — Z170 Estrogen receptor positive status [ER+]: Secondary | ICD-10-CM | POA: Insufficient documentation

## 2019-05-18 NOTE — Telephone Encounter (Signed)
Spoke with patient to confirm morning [facility] appointment, paperwork sent by [facility]

## 2019-05-22 ENCOUNTER — Encounter: Payer: Self-pay | Admitting: Anesthesiology

## 2019-05-23 NOTE — Progress Notes (Signed)
[place] NOTE  Patient Care Team: [name], PA-C as PCP - General (Physician Assistant) Mauro Kaufmann, RN as Oncology Nurse Navigator Rockwell Germany, RN as Oncology Nurse Navigator Stark Klein, MD as Consulting Physician (General Surgery) Nicholas Lose, MD as Consulting Physician (Hematology and Oncology) Gery Pray, MD as Consulting Physician (Radiation Oncology)  CHIEF COMPLAINTS/PURPOSE OF CONSULTATION:  Newly diagnosed breast cancer  HISTORY OF PRESENTING ILLNESS:  Kelly Beltran 64 y.o. female is here because of recent diagnosis of invasive ductal carcinoma of the right breast. Screening mammogram on 04/27/19 showed a 0.6cm mass in the right breast. [place] on 05/04/19 confirmed the 0.cm mass at the 10 o'clock position in the right breast. Biopsy on 05/16/19 showed invasive mammary carcinoma, grade 3, HER-2 equivocal by IHC, negative by FISH, ER+ 100%, PR- 0%, KI67 10%. She presents to the clinic today for initial evaluation and discussion of treatment options.   I reviewed her records extensively and collaborated the history with the patient.  SUMMARY OF ONCOLOGIC HISTORY: Oncology History  Malignant neoplasm of upper-outer quadrant of right breast in female, estrogen receptor positive ([place])  05/18/2019 Initial Diagnosis   Screening mammogram detected 0.6cm mass in the right breast, 10 o'clock position. 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   05/24/2019 Cancer Staging   Staging form: Breast, AJCC 8th Edition - Clinical stage from 05/24/2019: Stage IA (cT1b, cN0, cM0, G2, ER+, PR-, HER2-) - Signed by Nicholas Lose, MD on 05/24/2019    MEDICAL HISTORY:  Past Medical History:  Diagnosis Date  . Adenocarcinoma of cervix ([place])    IN SITU  . Cancer ([place])   . Diabetes mellitus    TYPE 2  . Elevated cholesterol   . Fibroid   . Hypertension   . Kidney stone   .  Uterine cancer [facility])     SURGICAL HISTORY: Past Surgical History:  Procedure Laterality Date  . ABDOMINAL HYSTERECTOMY  1997   REPAIR OF CYSTOTOMY  . CERVIX LESION DESTRUCTION    . CHOLECYSTECTOMY    . COLPOSCOPY    . LITHOTRIPSY      SOCIAL HISTORY: Social History   Socioeconomic History  . Marital status: Married    Spouse name: Not on file  . Number of children: Not on file  . Years of education: Not on file  . Highest education level: Not on file  Occupational History  . Not on file  Tobacco Use  . Smoking status: Never Smoker  . Smokeless tobacco: Never Used  Substance and Sexual Activity  . Alcohol use: Yes    Alcohol/week: 0.0 standard drinks    Comment: rare  . Drug use: No  . Sexual activity: Yes    Partners: Male    Birth control/protection: Surgical    Comment: 1st intercourse- 50, partners- 22, married- 10 yrs   Other Topics Concern  . Not on file  Social History Narrative  . Not on file   Social Determinants of Health   Financial Resource Strain:   . Difficulty of Paying Living Expenses:   Food Insecurity:   . Worried About Charity fundraiser in the Last Year:   . Arboriculturist in the Last Year:   Transportation Needs:   . Film/video editor (Medical):   Marland Kitchen Lack of Transportation (Non-Medical):   Physical Activity:   . Days of Exercise per Week:   .  Minutes of Exercise per Session:   Stress:   . Feeling of Stress :   Social Connections:   . Frequency of Communication with Friends and Family:   . Frequency of Social Gatherings with Friends and Family:   . Attends Religious Services:   . Active Member of Clubs or Organizations:   . Attends Archivist Meetings:   Marland Kitchen Marital Status:   Intimate Partner Violence:   . Fear of Current or Ex-Partner:   . Emotionally Abused:   Marland Kitchen Physically Abused:   . Sexually Abused:     FAMILY HISTORY: Family History  Problem Relation Age of Onset  . Diabetes Father   . Hypertension Father    . Prostate cancer Father   . Diabetes Mother   . Hypertension Mother   . Breast cancer Mother   . Cancer Mother        BLADDER  . Atrial fibrillation Mother   . Colon cancer Mother     ALLERGIES:  is allergic to lipitor [atorvastatin calcium]; mango flavor; pravachol; sulfa antibiotics; tylenol [acetaminophen]; and tetracyclines & related.  MEDICATIONS:  Current Outpatient Medications  Medication Sig Dispense Refill  . cholecalciferol (VITAMIN D) 1000 UNITS tablet Take 1,000 Units by mouth daily.    Marland Kitchen glucosamine-chondroitin 500-400 MG tablet Take 1 tablet by mouth 3 (three) times daily.    . hydrochlorothiazide (HYDRODIURIL) 25 MG tablet Take 25 mg by mouth daily.    . Ibuprofen (MOTRIN PO) Take by mouth. PRN    . lisinopril (PRINIVIL,ZESTRIL) 10 MG tablet Take by mouth daily.     Marland Kitchen loratadine (CLARITIN) 10 MG tablet Take 10 mg by mouth daily. PRN    . metFORMIN (GLUCOPHAGE-XR) 500 MG 24 hr tablet Take 1 tablet by mouth 3 (three) times daily.   0  . VITAMIN E PO Take by mouth.    . Zinc Sulfate (ZINC 15 PO) Take by mouth.     No current facility-administered medications for this visit.    REVIEW OF SYSTEMS:   Constitutional: Denies fevers, chills or abnormal night sweats Eyes: Denies blurriness of vision, double vision or watery eyes Ears, nose, mouth, throat, and face: Denies mucositis or sore throat Respiratory: Denies cough, dyspnea or wheezes Cardiovascular: Denies palpitation, chest discomfort or lower extremity swelling Gastrointestinal:  Denies nausea, heartburn or change in bowel habits Skin: Denies abnormal skin rashes Lymphatics: Denies new lymphadenopathy or easy bruising Neurological:Denies numbness, tingling or new weaknesses Behavioral/Psych: Mood is stable, no new changes  Breast: Denies any palpable lumps or discharge All other systems were reviewed with the patient and are negative.  PHYSICAL EXAMINATION: ECOG PERFORMANCE STATUS: 1 - Symptomatic but  completely ambulatory  Vitals:   05/24/19 0857  BP: (!) 143/80  Pulse: 99  Resp: 18  Temp: 98.3 F (36.8 C)  SpO2: 97%   Filed Weights   05/24/19 0857  Weight: 177 lb 6.4 oz (80.5 kg)    GENERAL:alert, no distress and comfortable SKIN: skin color, texture, turgor are normal, no rashes or significant lesions EYES: normal, conjunctiva are pink and non-injected, sclera clear OROPHARYNX:no exudate, no erythema and lips, buccal mucosa, and tongue normal  NECK: supple, thyroid normal size, non-tender, without nodularity LYMPH:  no palpable lymphadenopathy in the cervical, axillary or inguinal LUNGS: clear to auscultation and percussion with normal breathing effort HEART: regular rate & rhythm and no murmurs and no lower extremity edema ABDOMEN:abdomen soft, non-tender and normal bowel sounds Musculoskeletal:no cyanosis of digits and no clubbing  PSYCH:  alert & oriented x 3 with fluent speech NEURO: no focal motor/sensory deficits BREAST: No palpable nodules in breast. No palpable axillary or supraclavicular lymphadenopathy (exam performed in the presence of a chaperone)   LABORATORY DATA:  I have reviewed the data as listed Lab Results  Component Value Date   WBC 6.5 05/24/2019   HGB 13.9 05/24/2019   HCT 42.5 05/24/2019   MCV 82.2 05/24/2019   PLT 277 05/24/2019   Lab Results  Component Value Date   NA 140 05/24/2019   K 3.4 (L) 05/24/2019   CL 100 05/24/2019   CO2 26 05/24/2019    RADIOGRAPHIC STUDIES: I have personally reviewed the radiological reports and agreed with the findings in the report.  ASSESSMENT AND PLAN:  Malignant neoplasm of upper-outer quadrant of right breast in female, estrogen receptor positive ([place]) 05/18/2019:Screening mammogram detected 0.6cm mass in the right breast, 10 o'clock position. 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 Family history: Mother age 77 breast  cancer T1b N0 stage Ia clinical stage  Pathology and radiology counseling:Discussed with the patient, the details of pathology including the type of breast cancer,the clinical staging, the significance of ER, PR and HER-2/neu receptors and the implications for treatment. After reviewing the pathology in detail, we proceeded to discuss the different treatment options between surgery, radiation, chemotherapy, antiestrogen therapies.  Recommendations: 1. Breast conserving surgery followed by 2. Oncotype DX testing to determine if chemotherapy would be of any benefit followed by 3. Adjuvant radiation therapy followed by 4. Adjuvant antiestrogen therapy  Oncotype counseling: I discussed Oncotype DX test. I explained to the patient that this is a 21 gene panel to evaluate patient tumors DNA to calculate recurrence score. This would help determine whether patient has high risk or intermediate risk or low risk breast cancer. She understands that if her tumor was found to be high risk, she would benefit from systemic chemotherapy. If low risk, no need of chemotherapy. If she was found to be intermediate risk, we would need to evaluate the score as well as other risk factors and determine if an abbreviated chemotherapy may be of benefit.  Return to clinic after surgery to discuss final pathology report and then determine if Oncotype DX testing will need to be sent.   All questions were answered. The patient knows to call the clinic with any problems, questions or concerns.   Rulon Eisenmenger, MD, MPH 05/24/2019    I, Molly Dorshimer, am acting as scribe for Nicholas Lose, MD.  I have reviewed the above documentation for accuracy and completeness, and I agree with the above.

## 2019-05-24 ENCOUNTER — Encounter: Payer: Self-pay | Admitting: *Deleted

## 2019-05-24 ENCOUNTER — Encounter: Payer: Self-pay | Admitting: Physical Therapy

## 2019-05-24 ENCOUNTER — Encounter: Payer: Self-pay | Admitting: Genetic Counselor

## 2019-05-24 ENCOUNTER — Other Ambulatory Visit: Payer: Self-pay | Admitting: General Surgery

## 2019-05-24 ENCOUNTER — Ambulatory Visit: Payer: 59 | Attending: General Surgery | Admitting: Physical Therapy

## 2019-05-24 ENCOUNTER — Ambulatory Visit
Admission: RE | Admit: 2019-05-24 | Discharge: 2019-05-24 | Disposition: A | Discharge status: Home / Self Care | Payer: 59 | Source: Ambulatory Visit | Attending: Radiation Oncology | Admitting: Radiation Oncology

## 2019-05-24 ENCOUNTER — Other Ambulatory Visit: Payer: Self-pay

## 2019-05-24 ENCOUNTER — Inpatient Hospital Stay: Payer: 59 | Attending: Hematology and Oncology | Admitting: Hematology and Oncology

## 2019-05-24 ENCOUNTER — Ambulatory Visit (HOSPITAL_BASED_OUTPATIENT_CLINIC_OR_DEPARTMENT_OTHER): Payer: 59 | Admitting: Genetic Counselor

## 2019-05-24 ENCOUNTER — Inpatient Hospital Stay: Payer: 59

## 2019-05-24 DIAGNOSIS — Z170 Estrogen receptor positive status [ER+]: Secondary | ICD-10-CM | POA: Diagnosis present

## 2019-05-24 DIAGNOSIS — E119 Type 2 diabetes mellitus without complications: Secondary | ICD-10-CM | POA: Insufficient documentation

## 2019-05-24 DIAGNOSIS — R293 Abnormal posture: Secondary | ICD-10-CM | POA: Diagnosis present

## 2019-05-24 DIAGNOSIS — Z803 Family history of malignant neoplasm of breast: Secondary | ICD-10-CM

## 2019-05-24 DIAGNOSIS — Z8042 Family history of malignant neoplasm of prostate: Secondary | ICD-10-CM

## 2019-05-24 DIAGNOSIS — Z806 Family history of leukemia: Secondary | ICD-10-CM

## 2019-05-24 DIAGNOSIS — C50411 Malignant neoplasm of upper-outer quadrant of right female breast: Secondary | ICD-10-CM

## 2019-05-24 DIAGNOSIS — Z79899 Other long term (current) drug therapy: Secondary | ICD-10-CM | POA: Diagnosis not present

## 2019-05-24 DIAGNOSIS — E7800 Pure hypercholesterolemia, unspecified: Secondary | ICD-10-CM | POA: Insufficient documentation

## 2019-05-24 DIAGNOSIS — I10 Essential (primary) hypertension: Secondary | ICD-10-CM | POA: Insufficient documentation

## 2019-05-24 DIAGNOSIS — Z1379 Encounter for other screening for genetic and chromosomal anomalies: Secondary | ICD-10-CM

## 2019-05-24 DIAGNOSIS — D069 Carcinoma in situ of cervix, unspecified: Secondary | ICD-10-CM

## 2019-05-24 DIAGNOSIS — Z8052 Family history of malignant neoplasm of bladder: Secondary | ICD-10-CM

## 2019-05-24 DIAGNOSIS — Z800 Family history of malignant neoplasm of digestive organs: Secondary | ICD-10-CM

## 2019-05-24 LAB — CBC WITH DIFFERENTIAL (CANCER CENTER ONLY)
Abs Immature Granulocytes: 0.03 10*3/uL (ref 0.00–0.07)
Basophils Absolute: 0.1 10*3/uL (ref 0.0–0.1)
Basophils Relative: 1 %
Eosinophils Absolute: 0.3 10*3/uL (ref 0.0–0.5)
Eosinophils Relative: 5 %
HCT: 42.5 % (ref 36.0–46.0)
Hemoglobin: 13.9 g/dL (ref 12.0–15.0)
Immature Granulocytes: 1 %
Lymphocytes Relative: 33 %
Lymphs Abs: 2.1 10*3/uL (ref 0.7–4.0)
MCH: 26.9 pg (ref 26.0–34.0)
MCHC: 32.7 g/dL (ref 30.0–36.0)
MCV: 82.2 fL (ref 80.0–100.0)
Monocytes Absolute: 0.5 10*3/uL (ref 0.1–1.0)
Monocytes Relative: 8 %
Neutro Abs: 3.5 10*3/uL (ref 1.7–7.7)
Neutrophils Relative %: 52 %
Platelet Count: 277 10*3/uL (ref 150–400)
RBC: 5.17 MIL/uL — ABNORMAL HIGH (ref 3.87–5.11)
RDW: 13.2 % (ref 11.5–15.5)
WBC Count: 6.5 10*3/uL (ref 4.0–10.5)
nRBC: 0 % (ref 0.0–0.2)

## 2019-05-24 LAB — CMP (CANCER CENTER ONLY)
ALT: 12 U/L (ref 0–44)
AST: 18 U/L (ref 15–41)
Albumin: 4 g/dL (ref 3.5–5.0)
Alkaline Phosphatase: 48 U/L (ref 38–126)
Anion gap: 14 (ref 5–15)
BUN: 15 mg/dL (ref 8–23)
CO2: 26 mmol/L (ref 22–32)
Calcium: 10.1 mg/dL (ref 8.9–10.3)
Chloride: 100 mmol/L (ref 98–111)
Creatinine: 0.95 mg/dL (ref 0.44–1.00)
GFR, Est AFR Am: >60 mL/min (ref >60)
GFR, Estimated: >60 mL/min (ref >60)
Glucose, Bld: 201 mg/dL — ABNORMAL HIGH (ref 70–99)
Potassium: 3.4 mmol/L — ABNORMAL LOW (ref 3.5–5.1)
Sodium: 140 mmol/L (ref 135–145)
Total Bilirubin: 0.4 mg/dL (ref 0.3–1.2)
Total Protein: 7.2 g/dL (ref 6.5–8.1)

## 2019-05-24 LAB — GENETIC SCREENING ORDER

## 2019-05-24 NOTE — Patient Instructions (Signed)

## 2019-05-24 NOTE — Assessment & Plan Note (Signed)
05/18/2019:Screening mammogram detected 0.6cm mass in the right breast, 10 o'clock position. 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 Family history: Mother age 65 breast cancer T1b N0 stage Ia clinical stage  Pathology and radiology counseling:Discussed with the patient, the details of pathology including the type of breast cancer,the clinical staging, the significance of ER, PR and HER-2/neu receptors and the implications for treatment. After reviewing the pathology in detail, we proceeded to discuss the different treatment options between surgery, radiation, chemotherapy, antiestrogen therapies.  Recommendations: 1. Breast conserving surgery followed by 2. Oncotype DX testing to determine if chemotherapy would be of any benefit followed by 3. Adjuvant radiation therapy followed by 4. Adjuvant antiestrogen therapy  Oncotype counseling: I discussed Oncotype DX test. I explained to the patient that this is a 21 gene panel to evaluate patient tumors DNA to calculate recurrence score. This would help determine whether patient has high risk or intermediate risk or low risk breast cancer. She understands that if her tumor was found to be high risk, she would benefit from systemic chemotherapy. If low risk, no need of chemotherapy. If she was found to be intermediate risk, we would need to evaluate the score as well as other risk factors and determine if an abbreviated chemotherapy may be of benefit.  Return to clinic after surgery to discuss final pathology report and then determine if Oncotype DX testing will need to be sent.

## 2019-05-24 NOTE — Therapy (Signed)
[place], [place], [address] Phone: [phone]   Fax:  [phone]  Physical Therapy Evaluation  Patient Details  Name: Kelly Beltran MRN: [id number] Date of Birth: 10-13-1955 Referring Provider (PT): Dr. Stark Klein   Encounter Date: 05/24/2019  PT End of Session - 05/24/19 1253    Visit Number  1    Number of Visits  2    Date for PT Re-Evaluation  07/19/19    PT Start Time  0950    PT Stop Time  1000   Also saw pt from 1028-1044 for a total of 26 minutes   PT Time Calculation (min)  10 min    Activity Tolerance  Patient tolerated treatment well    Behavior During Therapy  [facility] for tasks assessed/performed       Past Medical History:  Diagnosis Date  . 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 Cancer ([place])   . Diabetes mellitus    TYPE 2  . Elevated cholesterol   . Fibroid   . Hypertension   . Kidney stone   . Uterine cancer [facility])     Past Surgical History:  Procedure Laterality Date  . ABDOMINAL HYSTERECTOMY  1997   REPAIR OF CYSTOTOMY  . CERVIX LESION DESTRUCTION    . CHOLECYSTECTOMY    . COLPOSCOPY    . LITHOTRIPSY      There were no vitals filed for this visit.   Subjective Assessment - 05/24/19 1237    Subjective  Patient reports she is here today to be seen by her medical team for her newly diagnosed right breast cancer.    Patient is accompained by:  Family member    Pertinent History  Patient was diagnosed on 04/27/2019 with right grade II invasive ductal carcinoma breast cancer. It measures 6 mm and is located in the upper outer quadrant. It is ER positive, PR negative, and HER2 negative with a Ki67 of 10%.    Patient Stated Goals  Reduce lymphedema risk and learn post op shoulder ROM HEP    Currently in Pain?  No/denies   Has intermittent joint pain from OA        [facility] PT Assessment - 05/24/19 0001      Assessment   Medical Diagnosis  Right breast cancer     Referring Provider (PT)  Dr. Stark Klein    Onset Date/Surgical Date  04/27/19    Hand Dominance  Right    Prior Therapy  none      Precautions   Precautions  Other (comment)    Precaution Comments  active cancer      Restrictions   Weight Bearing Restrictions  No      Balance Screen   Has the patient fallen in the past 6 months  No    Has the patient had a decrease in activity level because of a fear of falling?   No    Is the patient reluctant to leave their home because of a fear of falling?   No      Home Environment   Living Environment  Private residence    Living Arrangements  Spouse/significant other    Available Help at Discharge  Family      Prior Function   Level of [place]  Retired    Leisure  She does not exercise      Cognition   Overall Cognitive  Status  Within Functional Limits for tasks assessed      Posture/Postural Control   Posture/Postural Control  Postural limitations    Postural Limitations  Rounded Shoulders;Forward head      ROM / Strength   AROM / PROM / Strength  AROM;Strength      AROM   AROM Assessment Site  Shoulder;Cervical    Right/Left Shoulder  Right;Left    Right Shoulder Extension  52 Degrees    Right Shoulder Flexion  149 Degrees    Right Shoulder ABduction  147 Degrees    Right Shoulder Internal Rotation  59 Degrees    Right Shoulder External Rotation  74 Degrees    Left Shoulder Extension  50 Degrees    Left Shoulder Flexion  145 Degrees    Left Shoulder ABduction  152 Degrees    Left Shoulder Internal Rotation  54 Degrees    Left Shoulder External Rotation  79 Degrees    Cervical Flexion  WNL    Cervical Extension  WNL    Cervical - Right Side Bend  50% limited    Cervical - Left Side Bend  50% limited    Cervical - Right Rotation  WNL    Cervical - Left Rotation  25% limited      Strength   Overall Strength  Within functional limits for tasks performed        LYMPHEDEMA/ONCOLOGY  QUESTIONNAIRE - 05/24/19 1246      Type   Cancer Type  Right breast      Lymphedema Assessments   Lymphedema Assessments  Upper extremities      Right Upper Extremity Lymphedema   10 cm Proximal to Olecranon Process  26.3 cm    Olecranon Process  23.2 cm    10 cm Proximal to Ulnar Styloid Process  22.4 cm    Just Proximal to Ulnar Styloid Process  14.2 cm    Across Hand at [employer]  17.7 cm    At [place] of 2nd Digit  5.9 cm      Left Upper Extremity Lymphedema   10 cm Proximal to Olecranon Process  26.7 cm    Olecranon Process  22.9 cm    10 cm Proximal to Ulnar Styloid Process  20.6 cm    Just Proximal to Ulnar Styloid Process  14.6 cm    Across Hand at [employer]  17.5 cm    At [place] of 2nd Digit  5.9 cm          Quick Dash - 05/24/19 0001    Open a tight or new jar  No difficulty    Do heavy household chores (wash walls, wash floors)  No difficulty    Carry a shopping bag or briefcase  No difficulty    Wash your back  No difficulty    Use a knife to cut food  No difficulty    Recreational activities in which you take some force or impact through your arm, shoulder, or hand (golf, hammering, tennis)  No difficulty    During the past week, to what extent has your arm, shoulder or hand problem interfered with your normal social activities with family, friends, neighbors, or groups?  Not at all    During the past week, to what extent has your arm, shoulder or hand problem limited your work or other regular daily activities  Not at all    Arm, shoulder, or hand pain.  None    Tingling (  pins and needles) in your arm, shoulder, or hand  None    Difficulty Sleeping  No difficulty    DASH Score  0 %        Objective measurements completed on examination: See above findings.     Patient was instructed today in a home exercise program today for post op shoulder range of motion. These included active assist shoulder flexion in sitting, scapular retraction, wall  walking with shoulder abduction, and hands behind head external rotation.  She was encouraged to do these twice a day, holding 3 seconds and repeating 5 times when permitted by her physician.       PT Education - 05/24/19 1249    Education Details  Lymphedema risk reduction and post op shoulder ROM HEP    Person(s) Educated  Patient;Spouse    Methods  Explanation;Demonstration;Handout    Comprehension  Returned demonstration;Verbalized understanding          PT Long Term Goals - 05/24/19 1314      PT LONG TERM GOAL #1   Title  Patient will demonstrate she has regained full shoulder ROM and function post operatively compared to baselines.    Time  8    Period  Weeks    Status  New    Target Date  07/19/19      Breast Clinic Goals - 05/24/19 1314      Patient will be able to verbalize understanding of pertinent lymphedema risk reduction practices relevant to her diagnosis specifically related to skin care.   Time  1    Period  Days    Status  Achieved      Patient will be able to return demonstrate and/or verbalize understanding of the post-op home exercise program related to regaining shoulder range of motion.   Time  1    Period  Days    Status  Achieved      Patient will be able to verbalize understanding of the importance of attending the postoperative After Breast Cancer Class for further lymphedema risk reduction education and therapeutic exercise.   Time  1    Period  Days    Status  Achieved            Plan - 05/24/19 1254    Clinical Impression Statement  Patient was diagnosed on 04/27/2019 with right grade II invasive ductal carcinoma breast cancer. It measures 6 mm and is located in the upper outer quadrant. It is ER positive, PR negative, and HER2 negative with a Ki67 of 10%. Her multidisciplinary medical team met prior to her assessments to determine a recommended treatment plan. She is planning to have a right lumpectomy and sentinel node biopsy  followed by Oncotype testing, radiation, and anti-estrogen therapy. She will benefit from a post op PT ressessment to determine needs and an L-Dex screening every 3 months.    Stability/Clinical Decision Making  Stable/Uncomplicated    Clinical Decision Making  Low    Rehab Potential  Excellent    PT Frequency  --   Eval and 1 f/u 3-4 weeks post op   PT Treatment/Interventions  ADLs/Self Care Home Management;Therapeutic exercise;Patient/family education    PT Next Visit Plan  Will reassess 3-4 weeks post op    PT Home Exercise Plan  Post op shoulder ROM HEP    Consulted and Agree with Plan of Care  Patient;Family member/caregiver    Family Member Consulted  Husband       Patient will benefit from  skilled therapeutic intervention in order to improve the following deficits and impairments:  Postural dysfunction, Decreased range of motion, Decreased knowledge of precautions, Impaired UE functional use, Pain  Visit Diagnosis: Malignant neoplasm of upper-outer quadrant of right breast in female, estrogen receptor positive ([place]) - Plan: PT plan of care cert/re-cert  Abnormal posture - Plan: PT plan of care cert/re-cert   Patient will follow up at outpatient cancer rehab 3-4 weeks following surgery.  If the patient requires physical therapy at that time, a specific plan will be dictated and sent to the referring physician for approval. The patient was educated today on appropriate basic range of motion exercises to begin post operatively and the importance of attending the After Breast Cancer class following surgery.  Patient was educated today on lymphedema risk reduction practices as it pertains to recommendations that will benefit the patient immediately following surgery.  She verbalized good understanding.    The patient was assessed using the L-Dex machine today to produce a lymphedema index baseline score. The patient will be reassessed on a regular basis (typically every 3 months) to obtain  new L-Dex scores. If the score is > 6.5 points away from his/her baseline score indicating onset of subclinical lymphedema, it will be recommended to wear a compression garment for 4 weeks, 12 hours per day and then be reassessed. If the score continues to be > 6.5 points from baseline at reassessment, we will initiate lymphedema treatment. Assessing in this manner has a 95% rate of preventing clinically significant lymphedema.    Problem List Patient Active Problem List   Diagnosis Date Noted  . Malignant neoplasm of upper-outer quadrant of right breast in female, estrogen receptor positive ([place]) 05/18/2019  . Menopausal state 03/30/2012  . 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 Elevated cholesterol   . Kidney stone    Annia Friendly, [place] 05/24/19 1:17 PM  [place], [place], [address] Phone: [phone]   Fax:  [phone]  Name: Kelly Beltran MRN: [id number] Date of Birth: 1955/06/07

## 2019-05-24 NOTE — Progress Notes (Signed)
Radiation Oncology         [phone] ________________________________  Multidisciplinary Breast Oncology Clinic [facility]) Initial Outpatient Consultation  Name: Kelly Beltran MRN: [id number]  Date: 05/24/2019  DOB: [birth year]  NO:[facility], Lanora Manis, MD   REFERRING PHYSICIAN: Stark Klein, MD  DIAGNOSIS: The encounter diagnosis was Malignant neoplasm of upper-outer quadrant of right breast in female, estrogen receptor positive ([place]).  Stage T1b, NX Right Breast UOQ, Invasive Ductal Carcinoma, ER+ / PR- / Her2-, Grade 2    ICD-10-CM   1. Malignant neoplasm of upper-outer quadrant of right breast in female, estrogen receptor positive ([place])  C50.411    Z17.0     HISTORY OF PRESENT ILLNESS::Kelly Beltran is a 64 y.o. female who is presenting to the office today for evaluation of her newly diagnosed breast cancer. She is accompanied by her husband. She is doing well overall.   She had routine screening mammography on 04/27/2019 showing an indeterminate 0.6 cm oval mass with a circumscribed margin in the upper outer right breast. She underwent unilateral diagnostic mammography with tomography and right breast ultrasonography at [facility] on 05/04/2019 showing a 0.6 cm taller than wide irregular mass in the right breast that was suspicious for malignancy.  Biopsy on 05/16/2019 showed grade 2 invasive mammary carcinoma that is E-cadherin positive, consistent with with ductal phenotype. Prognostic indicators significant for estrogen receptor, 100% positive with strong staining intensity and progesterone receptor, 0% negative. Proliferation marker Ki67 at 10%. HER2 negative.  Menarche: 64 years old Age at first live birth: 64 years old GP: 1 LMP: 1997 Contraceptive: No HRT: No   The patient was referred today for presentation in the multidisciplinary conference.  Radiology studies and pathology slides were presented there for review and discussion of treatment options.  A  consensus was discussed regarding potential next steps.  PREVIOUS RADIATION THERAPY: No  PAST MEDICAL HISTORY:  Past Medical History:  Diagnosis Date  . 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 Cancer ([place])   . Diabetes mellitus    TYPE 2  . Elevated cholesterol   . Family history of bladder cancer   . Family history of breast cancer   . Family history of colon cancer   . Family history of leukemia   . Family history of prostate cancer   . Fibroid   . Hypertension   . Kidney stone   . Uterine cancer ([place])     PAST SURGICAL HISTORY: Past Surgical History:  Procedure Laterality Date  . ABDOMINAL HYSTERECTOMY  1997   REPAIR OF CYSTOTOMY  . CERVIX LESION DESTRUCTION    . CHOLECYSTECTOMY    . COLPOSCOPY    . LITHOTRIPSY      FAMILY HISTORY:  Family History  Problem Relation Age of Onset  . Diabetes Father   . Hypertension Father   . Prostate cancer Father 43  . Diabetes Mother   . Hypertension Mother   . Breast cancer Mother 46       2nd diagnosis at 64  . Atrial fibrillation Mother   . Colon cancer Mother 97  . Bladder Cancer Mother 50  . Stroke Paternal Grandmother   . Breast cancer Other        maternal cousin's daughter; dx. in her 72s, double mastectomy, may have had positive genetic test  . Leukemia Maternal Aunt 1    SOCIAL HISTORY:  Social History   Socioeconomic History  . Marital status: Married  Spouse name: Not on file  . Number of children: Not on file  . Years of education: Not on file  . Highest education level: Not on file  Occupational History  . Not on file  Tobacco Use  . Smoking status: Never Smoker  . Smokeless tobacco: Never Used  Substance and Sexual Activity  . Alcohol use: Yes    Alcohol/week: 0.0 standard drinks    Comment: rare  . Drug use: No  . Sexual activity: Yes    Partners: Male    Birth control/protection: Surgical    Comment: 1st intercourse- 69, partners- 30, married- 64 yrs   Other Topics Concern  . Not  on file  Social History Narrative  . Not on file   Social Determinants of Health   Financial Resource Strain:   . Difficulty of Paying Living Expenses:   Food Insecurity:   . Worried About Charity fundraiser in the Last Year:   . Arboriculturist in the Last Year:   Transportation Needs:   . Film/video editor (Medical):   Marland Kitchen Lack of Transportation (Non-Medical):   Physical Activity:   . Days of Exercise per Week:   . Minutes of Exercise per Session:   Stress:   . Feeling of Stress :   Social Connections:   . Frequency of Communication with Friends and Family:   . Frequency of Social Gatherings with Friends and Family:   . Attends Religious Services:   . Active Member of Clubs or Organizations:   . Attends Archivist Meetings:   Marland Kitchen Marital Status:     ALLERGIES:  Allergies  Allergen Reactions  . Lipitor [Atorvastatin Calcium]   . Mango Flavor     Redness  . Pravachol   . Sulfa Antibiotics   . Tylenol [Acetaminophen]   . Tetracyclines & Related Palpitations    MEDICATIONS:  Current Outpatient Medications  Medication Sig Dispense Refill  . cholecalciferol (VITAMIN D) 1000 UNITS tablet Take 1,000 Units by mouth daily.    Marland Kitchen glucosamine-chondroitin 500-400 MG tablet Take 1 tablet by mouth 3 (three) times daily.    . hydrochlorothiazide (HYDRODIURIL) 25 MG tablet Take 25 mg by mouth daily.    . Ibuprofen (MOTRIN PO) Take by mouth. PRN    . lisinopril (PRINIVIL,ZESTRIL) 10 MG tablet Take by mouth daily.     Marland Kitchen loratadine (CLARITIN) 10 MG tablet Take 10 mg by mouth daily. PRN    . metFORMIN (GLUCOPHAGE-XR) 500 MG 24 hr tablet Take 1 tablet by mouth 3 (three) times daily.   0  . VITAMIN E PO Take by mouth.    . Zinc Sulfate (ZINC 15 PO) Take by mouth.     No current facility-administered medications for this encounter.    REVIEW OF SYSTEMS: A 10+ POINT REVIEW OF SYSTEMS WAS OBTAINED including neurology, dermatology, psychiatry, cardiac, respiratory, lymph,  extremities, GI, GU, musculoskeletal, constitutional, reproductive, HEENT. On the provided form, she reports history of arthritis, diabetes, hernia, and wearing glasses. She also report palpitations and hot flashes that are "hormone related". She denies fever, chills, cough, shortness of breath, abdominal pain, nausea, vomiting, diarrhea, skin changes, and any other symptoms.    PHYSICAL EXAM:   Vitals with BMI 05/24/2019  Height _0   Weight 177 lbs 6 oz  BMI 99.37  Systolic 169  Diastolic 80  Pulse 99   Lungs are clear to auscultation bilaterally. Heart has regular rate and rhythm. No palpable cervical, supraclavicular, or axillary  adenopathy. Abdomen soft, non-tender, normal bowel sounds. Left breast is large and pendulous without palpable mass, nipple discharge, or bleeding.  Right breast is large and pendulous without palpable mass, nipple discharge, or bleeding. There is a biopsy site in the upper outer quadrant.  KPS = 90  100 - Normal; no complaints; no evidence of disease. 90   - Able to carry on normal activity; minor signs or symptoms of disease. 80   - Normal activity with effort; some signs or symptoms of disease. 78   - Cares for self; unable to carry on normal activity or to do active work. 60   - Requires occasional assistance, but is able to care for most of his personal needs. 50   - Requires considerable assistance and frequent medical care. 71   - Disabled; requires special care and assistance. 73   - Severely disabled; hospital admission is indicated although death not imminent. 74   - Very sick; hospital admission necessary; active supportive treatment necessary. 10   - Moribund; fatal processes progressing rapidly. 0     - Dead  Karnofsky DA, Abelmann Bison, Craver LS and Burchenal JH [phone]) The use of the nitrogen mustards in the palliative treatment of carcinoma: with particular reference to bronchogenic carcinoma Cancer 1 634-56  LABORATORY DATA:  Lab Results   Component Value Date   WBC 6.5 05/24/2019   HGB 13.9 05/24/2019   HCT 42.5 05/24/2019   MCV 82.2 05/24/2019   PLT 277 05/24/2019   Lab Results  Component Value Date   NA 140 05/24/2019   K 3.4 (L) 05/24/2019   CL 100 05/24/2019   CO2 26 05/24/2019   Lab Results  Component Value Date   ALT 12 05/24/2019   AST 18 05/24/2019   ALKPHOS 48 05/24/2019   BILITOT 0.4 05/24/2019    PULMONARY FUNCTION TEST:   Recent Review Flowsheet Data    There is no flowsheet data to display.      RADIOGRAPHY: No results found.    IMPRESSION: Stage T1b, NX Right Breast UOQ, Invasive Ductal Carcinoma, ER+ / PR- / Her2-, Grade 2  Patient will be a good candidate for breast conservation with radiotherapy to the right breast. She does have large, pendulous breasts and so I am unsure if she would be a candidate for hypo-fractionated accelerated course of treatment. We discussed the general course of radiation, potential side effects, and toxicities with radiation and the patient is interested in this approach.  Of note, the patient could potentially have her treatment at the [facility] facility, but she seems to be leaning more towards having her treatment here in [place].   PLAN:  1. Genetics 2. Right lumpectomy with sentinel node biopsy 3. Oncotype 4. Adjuvant radiation therapy 5. Aromatase inhibitor   ------------------------------------------------  Blair Promise, PhD, MD  This document serves as a record of services personally performed by Gery Pray, MD. It was created on his behalf by Clerance Lav, a trained medical scribe. The creation of this record is based on the scribe's personal observations and the provider's statements to them. This document has been checked and approved by the attending provider.

## 2019-05-24 NOTE — Progress Notes (Signed)
REFERRING PROVIDER: Nicholas Lose, MD [address] [place],  [place] [id number]  PRIMARY PROVIDER:  [name], PA-C  PRIMARY REASON FOR VISIT:  1. Malignant neoplasm of upper-outer quadrant of right breast in female, estrogen receptor positive ([place])   2. Family history of breast cancer   3. Family history of bladder cancer   4. Family history of colon cancer   5. Family history of prostate cancer   6. Family history of leukemia   7. Adenocarcinoma in situ (AIS) of uterine cervix     I connected with Kelly Beltran on 05/24/2019 at 11:00 am EDT by webex video conference and verified that I am speaking with the correct person using two identifiers.   Patient location: [facility] clinic Provider location: [facility] office  HISTORY OF PRESENT ILLNESS:   Kelly Beltran, a 64 y.o. female, was seen for a [place] cancer genetics consultation at the request of Dr. Lindi Adie due to a personal and family history of cancer.  Kelly Beltran presents to clinic today to discuss the possibility of a hereditary predisposition to cancer, genetic testing, and to further clarify her future cancer risks, as well as potential cancer risks for family members.   In 1993, at the age of 40, Kelly Beltran was diagnosed with adenocarcinoma in situ of the cervix (confirmed by records). She underwent a hysterectomy in 1997 at the age of 75.  Kelly Beltran also has a history of skin cancer - she has had three basal cell carcinomas removed, beginning in approximately 1978.  In April of 2021, at the age of 19, Kelly Beltran was diagnosed with invasive ductal carcinoma, ER+/PR-/Her2-, of the right breast. Her treatment plan includes surgery, oncotype, radiation therapy, and antiestrogen therapy.   CANCER HISTORY:  Oncology History  Malignant neoplasm of upper-outer quadrant of right breast in female, estrogen receptor positive ([place])  05/18/2019 Initial Diagnosis   Screening mammogram detected 0.6cm mass in the right  breast, 10 o'clock position. 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   05/24/2019 Cancer Staging   Staging form: Breast, AJCC 8th Edition - Clinical stage from 05/24/2019: Stage IA (cT1b, cN0, cM0, G2, ER+, PR-, HER2-) - Signed by Nicholas Lose, MD on 05/24/2019      RISK FACTORS:  Menarche was at age 48.  First live birth at age [age].  OCP use for approximately 1.5 years.  Ovaries intact: yes.  Hysterectomy: no.  Menopausal status: postmenopausal - still experiences menopausal symptoms.  HRT use: 0 years. Colonoscopy: never. Mammogram within the last year: yes. Number of breast biopsies: 1. Any excessive radiation exposure in the past: no  Past Medical History:  Diagnosis Date  . Adenocarcinoma of cervix ([place])    IN SITU  . Cancer ([place])   . Diabetes mellitus    TYPE 2  . Elevated cholesterol   . Family history of bladder cancer   . Family history of breast cancer   . Family history of colon cancer   . Family history of leukemia   . Family history of prostate cancer   . Fibroid   . Hypertension   . Kidney stone   . Uterine cancer [facility])     Past Surgical History:  Procedure Laterality Date  . ABDOMINAL HYSTERECTOMY  1997   REPAIR OF CYSTOTOMY  . CERVIX LESION DESTRUCTION    . CHOLECYSTECTOMY    . COLPOSCOPY    . LITHOTRIPSY  Social History   Socioeconomic History  . Marital status: Married    Spouse name: Not on file  . Number of children: Not on file  . Years of education: Not on file  . Highest education level: Not on file  Occupational History  . Not on file  Tobacco Use  . Smoking status: Never Smoker  . Smokeless tobacco: Never Used  Substance and Sexual Activity  . Alcohol use: Yes    Alcohol/week: 0.0 standard drinks    Comment: rare  . Drug use: No  . Sexual activity: Yes    Partners: Male    Birth control/protection: Surgical    Comment: 1st intercourse-  42, partners- 63, married- 72 yrs   Other Topics Concern  . Not on file  Social History Narrative  . Not on file   Social Determinants of Health   Financial Resource Strain:   . Difficulty of Paying Living Expenses:   Food Insecurity:   . Worried About Charity fundraiser in the Last Year:   . Arboriculturist in the Last Year:   Transportation Needs:   . Film/video editor (Medical):   Marland Kitchen Lack of Transportation (Non-Medical):   Physical Activity:   . Days of Exercise per Week:   . Minutes of Exercise per Session:   Stress:   . Feeling of Stress :   Social Connections:   . Frequency of Communication with Friends and Family:   . Frequency of Social Gatherings with Friends and Family:   . Attends Religious Services:   . Active Member of Clubs or Organizations:   . Attends Archivist Meetings:   Marland Kitchen Marital Status:      FAMILY HISTORY:  We obtained a detailed, 4-generation family history.  Significant diagnoses are listed below: Family History  Problem Relation Age of Onset  . Diabetes Father   . Hypertension Father   . Prostate cancer Father 42  . Diabetes Mother   . Hypertension Mother   . Breast cancer Mother 65       2nd diagnosis at 57  . Atrial fibrillation Mother   . Colon cancer Mother 19  . Bladder Cancer Mother 70  . Stroke Paternal Grandmother   . Breast cancer Other        maternal cousin's daughter; dx. in her 16s, double mastectomy, may have had positive genetic test  . Leukemia Maternal Aunt 1   Kelly Beltran has one daughter who is 70 and has not had cancer. She has one brother and one sister who are in their 27s and have not had cancer.   Kelly Beltran's mother died at the age of 79 and had a history of multiple cancer. She had bladder cancer diagnosed at the age of 93, breast cancer diagnosed at the age of 51, a second breast cancer diagnosed at the age of 15, and colon cancer diagnosed at the age of 31. Kelly Beltran has five maternal aunts.  One aunt died from leukemia when she was 81.75 years old. Kelly Beltran's maternal grandmother died at the age of 62, and her maternal grandfather died when he was older than 75. She has a maternal first cousin, once removed (maternal cousin's daughter) who had a double mastectomy, possibly as a result of breast cancer diagnosed in her 57s and abnormal genetic testing.   Kelly Beltran's father died at the age of 17 and was diagnosed with prostate cancer shortly before he died. She has one paternal uncle  who died when he was older than 65. Her paternal grandmother died at the age of 37 from a stroke, and her paternal grandfather died when he was older than 89. There are no other known diagnoses of cancer on the paternal side of the family.  Kelly Beltran is aware of previous family history of genetic testing for hereditary cancer risks in her cousin's daughter which was reportedly positive, although she does not have additional details about these results. Her ancestors are of [place], Warehouse manager, [place], and [place] descent. There is no reported Ashkenazi Jewish ancestry. There is no known consanguinity.  GENETIC COUNSELING ASSESSMENT: Kelly Beltran is a 64 y.o. female with a personal and family history of breast cancer, which is somewhat suggestive of a hereditary cancer syndrome and predisposition to cancer. We, therefore, discussed and recommended the following at today's visit.   DISCUSSION: We discussed that approximately 5-10% of breast cancer is hereditary, with most cases associated with the BRCA1 and BRCA2 genes.  There are other genes that can be associated with hereditary breast cancer syndromes.  These include ATM, CHEK2, PALB2, etc.  We discussed that testing is beneficial for several reasons, including knowing about other cancer risks, identifying potential screening and risk-reduction options that may be appropriate, and to understand if other family members could be at risk for cancer and  allow them to undergo genetic testing.   We reviewed the characteristics, features and inheritance patterns of hereditary cancer syndromes. We also discussed genetic testing, including the implications of a positive vs negative result. Based on Kelly Beltran's personal and family history of cancer, she meets medical criteria for genetic testing. We recommended Kelly Beltran pursue genetic testing for a hereditary cancer gene panel, if she is interested. Kelly Beltran would like some time to consider genetic testing and talk with her relatives before deciding whether to proceed with testing.  PLAN:  Kelly Beltran did not wish to pursue genetic testing at today's visit. We understand this decision and remain available to coordinate genetic testing at any time in the future. We, therefore, recommend Kelly Beltran continue to follow the cancer screening guidelines given by her primary healthcare provider.  Lastly, we encouraged Ms. Pudlo to remain in contact with cancer genetics so that we can continuously update the family history and inform her of any changes in cancer genetics and testing that may be of benefit for this family.   Ms. Puskarich questions were answered to her satisfaction today. Our contact information was provided should additional questions or concerns arise. Thank you for the referral and allowing [place] to share in the care of your patient.   Clint Guy, [place], [facility] Licensed, Certified Dispensing [email or web address] Phone: [phone]  The patient was seen for a total of 25 minutes in face-to-face genetic counseling.  This patient was discussed with Drs. Magrinat, Lindi Adie and/or Burr Medico who agrees with the above.    _______________________________________________________________________ For Office Staff:  Number of people involved in session: 1 Was an Intern/ student involved with case: no

## 2019-05-31 ENCOUNTER — Telehealth: Payer: Self-pay | Admitting: Nutrition

## 2019-05-31 NOTE — Telephone Encounter (Signed)
I contacted patient by telephone to follow-up on nutrition information provided at breast clinic.  Patient reports she has not reviewed any of the information yet.  She has not felt like reading about it.  She denies nutrition impact symptoms currently.  Encourage patient to try to consume healthy plant-based diet.  She has RD contact information for questions or concerns.

## 2019-06-01 ENCOUNTER — Telehealth: Payer: Self-pay | Admitting: Hematology and Oncology

## 2019-06-01 ENCOUNTER — Encounter: Payer: Self-pay | Admitting: *Deleted

## 2019-06-01 NOTE — Progress Notes (Signed)
Left message to follow up from [facility] last week.  Contact information given.

## 2019-06-01 NOTE — Telephone Encounter (Signed)
Scheduled appt per 4/29 sch message - pt is aware of appt date and time   

## 2019-06-09 NOTE — Progress Notes (Signed)
[facility] [address] [place] [address] Phone: [phone] Fax: [phone]  [id number] - [place], [place] [address] Phone: [phone] Fax: [phone]    Your procedure is scheduled on Thursday, May 13th.  Report to [facility] Main Entrance "A" at 11:00 A.M., and check in at the Admitting office.  Call this number if you have problems the morning of surgery:  [phone]  Call [phone] if you have any questions prior to your surgery date Monday-Friday 8am-4pm   Remember:  Do not eat  after midnight the night before your surgery  You may drink clear liquids until 10;00 A.M.the morning of your surgery.   Clear liquids allowed are: Water, Non-Citrus Juices (without pulp), Carbonated Beverages, Clear Tea, Black Coffee Only, and Gatorade   Please complete your PRE-SURGERY G2 that was provided to you by 10:00 A.M. the morning of surgery.  Please, if able, drink it in one setting. DO NOT SIP.   Take these medicines the morning of surgery with A SIP OF WATER  esomeprazole (NEXIUM)  loratadine (CLARITIN)   If needed - ketotifen (ZADITOR)/eye drops  As of today, STOP taking diclofenac Sodium (VOLTAREN),  any Aspirin (unless otherwise instructed by your surgeon) and Aspirin containing products, Aleve, Naproxen, Ibuprofen, Motrin, Advil, Goody's, BC's, all herbal medications, fish oil, and all vitamins.  WHAT DO I DO ABOUT MY DIABETES MEDICATION?  . Do NOT take oral diabetes medicines (pills) the morning of surgery.  HOW TO MANAGE YOUR DIABETES BEFORE AND AFTER SURGERY  Why is it important to control my blood sugar before and after surgery? . Improving blood sugar levels before and after surgery helps healing and can limit problems. . A way of improving blood sugar control is eating a healthy diet by: o  Eating less sugar and  carbohydrates o  Increasing activity/exercise o  Talking with your doctor about reaching your blood sugar goals . High blood sugars (greater than 180 mg/dL) can raise your risk of infections and slow your recovery, so you will need to focus on controlling your diabetes during the weeks before surgery. . Make sure that the doctor who takes care of your diabetes knows about your planned surgery including the date and location.  How do I manage my blood sugar before surgery? . Check your blood sugar at least 4 times a day, starting 2 days before surgery, to make sure that the level is not too high or low. . Check your blood sugar the morning of your surgery when you wake up and every 2 hours until you get to the Short Stay unit. o If your blood sugar is less than 70 mg/dL, you will need to treat for low blood sugar: - Do not take insulin. - Treat a low blood sugar (less than 70 mg/dL) with  cup of clear juice (cranberry or apple), 4 glucose tablets, OR glucose gel. - Recheck blood sugar in 15 minutes after treatment (to make sure it is greater than 70 mg/dL). If your blood sugar is not greater than 70 mg/dL on recheck, call [phone] for further instructions. . Report your blood sugar to the short stay nurse when you get to Short Stay.  . If you are admitted to the hospital after surgery: o Your blood sugar will be checked by the staff and you will probably be given insulin after surgery (instead of oral  diabetes medicines) to make sure you have good blood sugar levels. o The goal for blood sugar control after surgery is 80-180 mg/dL.                     Do not wear jewelry, make up, or nail polish            Do not wear lotions, powders, perfumes, or deodorant.            Do not shave 48 hours prior to surgery.              Do not bring valuables to the hospital.            [facility] is not responsible for any belongings or valuables.  Do NOT Smoke (Tobacco/Vapping) or drink Alcohol 24  hours prior to your procedure If you use a CPAP at night, you may bring all equipment for your overnight stay.   Contacts, glasses, dentures or bridgework may not be worn into surgery.      For patients admitted to the hospital, discharge time will be determined by your treatment team.   Patients discharged the day of surgery will not be allowed to drive home, and someone needs to stay with them for 24 hours.  Special instructions:   [place]- Preparing For Surgery  Before surgery, you can play an important role. Because skin is not sterile, your skin needs to be as free of germs as possible. You can reduce the number of germs on your skin by washing with CHG (chlorahexidine gluconate) Soap before surgery.  CHG is an antiseptic cleaner which kills germs and bonds with the skin to continue killing germs even after washing.    Oral Hygiene is also important to reduce your risk of infection.  Remember - BRUSH YOUR TEETH THE MORNING OF SURGERY WITH YOUR REGULAR TOOTHPASTE  Please do not use if you have an allergy to CHG or antibacterial soaps. If your skin becomes reddened/irritated stop using the CHG.  Do not shave (including legs and underarms) for at least 48 hours prior to first CHG shower. It is OK to shave your face.  Please follow these instructions carefully.   1. Shower the NIGHT BEFORE SURGERY and the MORNING OF SURGERY with CHG Soap.   2. If you chose to wash your hair, wash your hair first as usual with your normal shampoo.  3. After you shampoo, rinse your hair and body thoroughly to remove the shampoo.  4. Use CHG as you would any other liquid soap. You can apply CHG directly to the skin and wash gently with a scrungie or a clean washcloth.   5. Apply the CHG Soap to your body ONLY FROM THE NECK DOWN.  Do not use on open wounds or open sores. Avoid contact with your eyes, ears, mouth and genitals (private parts). Wash Face and genitals (private parts)  with your normal soap.    6. Wash thoroughly, paying special attention to the area where your surgery will be performed.  7. Thoroughly rinse your body with warm water from the neck down.  8. DO NOT shower/wash with your normal soap after using and rinsing off the CHG Soap.  9. Pat yourself dry with a CLEAN TOWEL.  10. Wear CLEAN PAJAMAS to bed the night before surgery, wear comfortable clothes the morning of surgery  11. Place CLEAN SHEETS on your bed the night of your first shower and DO NOT SLEEP WITH PETS.  Day of Surgery: Shower with CHG soap as instructed above.  Do not apply any deodorants/lotions.  Please wear clean clothes to the hospital/surgery center.   Remember to brush your teeth WITH YOUR REGULAR TOOTHPASTE.   Please read over the following fact sheets that you were given.

## 2019-06-12 ENCOUNTER — Other Ambulatory Visit: Payer: Self-pay

## 2019-06-12 ENCOUNTER — Encounter (HOSPITAL_COMMUNITY): Payer: Self-pay

## 2019-06-12 ENCOUNTER — Other Ambulatory Visit (HOSPITAL_COMMUNITY)
Admission: RE | Admit: 2019-06-12 | Discharge: 2019-06-12 | Disposition: A | Discharge status: Home / Self Care | Payer: 59 | Source: Ambulatory Visit | Attending: General Surgery | Admitting: General Surgery

## 2019-06-12 ENCOUNTER — Encounter (HOSPITAL_COMMUNITY)
Admission: RE | Admit: 2019-06-12 | Discharge: 2019-06-12 | Disposition: A | Discharge status: Home / Self Care | Payer: 59 | Source: Ambulatory Visit | Attending: General Surgery | Admitting: General Surgery

## 2019-06-12 DIAGNOSIS — Z20822 Contact with and (suspected) exposure to covid-19: Secondary | ICD-10-CM | POA: Diagnosis not present

## 2019-06-12 DIAGNOSIS — Z01818 Encounter for other preprocedural examination: Secondary | ICD-10-CM | POA: Diagnosis present

## 2019-06-12 HISTORY — DX: Personal history of urinary calculi: Z87.442

## 2019-06-12 HISTORY — DX: Other specified postprocedural states: Z98.890

## 2019-06-12 HISTORY — DX: Unspecified osteoarthritis, unspecified site: M19.90

## 2019-06-12 HISTORY — DX: Gastro-esophageal reflux disease without esophagitis: K21.9

## 2019-06-12 HISTORY — DX: Other specified postprocedural states: R11.2

## 2019-06-12 HISTORY — DX: Malignant neoplasm of unspecified site of unspecified female breast: C50.919

## 2019-06-12 LAB — CBC
HCT: 43.7 % (ref 36.0–46.0)
Hemoglobin: 14.1 g/dL (ref 12.0–15.0)
MCH: 27.8 pg (ref 26.0–34.0)
MCHC: 32.3 g/dL (ref 30.0–36.0)
MCV: 86 fL (ref 80.0–100.0)
Platelets: 285 10*3/uL (ref 150–400)
RBC: 5.08 MIL/uL (ref 3.87–5.11)
RDW: 13.5 % (ref 11.5–15.5)
WBC: 7.6 10*3/uL (ref 4.0–10.5)
nRBC: 0 % (ref 0.0–0.2)

## 2019-06-12 LAB — BASIC METABOLIC PANEL
Anion gap: 11 (ref 5–15)
BUN: 19 mg/dL (ref 8–23)
CO2: 27 mmol/L (ref 22–32)
Calcium: 10 mg/dL (ref 8.9–10.3)
Chloride: 101 mmol/L (ref 98–111)
Creatinine, Ser: 0.84 mg/dL (ref 0.44–1.00)
GFR calc Af Amer: >60 mL/min (ref >60)
GFR calc non Af Amer: >60 mL/min (ref >60)
Glucose, Bld: 170 mg/dL — ABNORMAL HIGH (ref 70–99)
Potassium: 3.5 mmol/L (ref 3.5–5.1)
Sodium: 139 mmol/L (ref 135–145)

## 2019-06-12 LAB — GLUCOSE, CAPILLARY: Glucose-Capillary: 190 mg/dL — ABNORMAL HIGH (ref 70–99)

## 2019-06-12 LAB — HEMOGLOBIN A1C
Hgb A1c MFr Bld: 7.6 % — ABNORMAL HIGH (ref 4.8–5.6)
Mean Plasma Glucose: 171.42 mg/dL

## 2019-06-12 LAB — SARS CORONAVIRUS 2 (TAT 6-24 HRS): SARS Coronavirus 2: NEGATIVE

## 2019-06-12 NOTE — Progress Notes (Signed)
Your procedure is scheduled on Thursday, May 13th.  Report to [facility] Main Entrance "A" at 11:00 A.M., and check in at the Admitting office.               Your surgery or procedure is scheduled for 1:00 PM  Call this number if you have problems the morning of surgery:  [phone]- this is the pre- op desk  Call [phone] if you have any questions prior to your surgery date Monday-Friday 8am-4pm   Remember:  Do not eat  after midnight the night before your surgery  You may drink clear liquids until 10:00 A.M.the morning of your surgery.   Clear liquids allowed are: Water, Non-Citrus Juices (without pulp), Carbonated Beverages, Clear Tea, Black Coffee Only, and Gatorade   Please complete your PRE-SURGERY G2 that was provided to you by 10:00 A.M. the morning of surgery.  Please, if able, drink it in one setting. DO NOT SIP.   Take these medicines the morning of surgery with A SIP OF WATER  esomeprazole (NEXIUM)  loratadine (CLARITIN)   If needed - ketotifen (ZADITOR)/eye drops                   Guaifenesin (Tussin)  As of today, STOP taking diclofenac Sodium (VOLTAREN),  any Aspirin (unless otherwise instructed by your surgeon) and Aspirin containing products, Aleve, Naproxen, Ibuprofen, Motrin, Advil, Goody's, BC's, all herbal medications, fish oil, and all vitamins.  WHAT DO I DO ABOUT MY DIABETES MEDICATION?  . Do NOT take oral diabetes medicines (pills) the morning of surgery.  HOW TO MANAGE YOUR DIABETES BEFORE AND AFTER SURGERY  Why is it important to control my blood sugar before and after surgery? . Improving blood sugar levels before and after surgery helps healing and can limit problems. . A way of improving blood sugar control is eating a healthy diet by: o  Eating less sugar and carbohydrates o  Increasing activity/exercise o  Talking with your doctor about reaching your blood sugar goals . High blood sugars (greater than 180 mg/dL) can raise your risk of  infections and slow your recovery, so you will need to focus on controlling your diabetes during the weeks before surgery. . Make sure that the doctor who takes care of your diabetes knows about your planned surgery including the date and location.  How do I manage my blood sugar before surgery? . Check your blood sugar at least 4 times a day, starting 2 days before surgery, to make sure that the level is not too high or low. . Check your blood sugar the morning of your surgery when you wake up and every 2 hours until you get to the Short Stay unit. o If your blood sugar is less than 70 mg/dL, you will need to treat for low blood sugar: - Do not take insulin. - Treat a low blood sugar (less than 70 mg/dL) with  cup of clear juice (cranberry or apple), 4 glucose tablets, OR glucose gel. - Recheck blood sugar in 15 minutes after treatment (to make sure it is greater than 70 mg/dL). If your blood sugar is not greater than 70 mg/dL on recheck, call [phone] for further instructions. . Report your blood sugar to the short stay nurse when you get to Short Stay.  . If you are admitted to the hospital after surgery: o Your blood sugar will be checked by the staff and you will probably be given insulin after surgery (instead of oral  diabetes medicines) to make sure you have good blood sugar levels. o The goal for blood sugar control after surgery is 80-180 mg/dL.                     Do not wear jewelry, make up, or nail polish            Do not wear lotions, powders, perfumes, or deodorant.            Do not shave 48 hours prior to surgery.              Do not bring valuables to the hospital.            [facility] is not responsible for any belongings or valuables.  Do NOT Smoke (Tobacco/Vapping) or drink Alcohol 24 hours prior to your procedure If you use a CPAP at night, you may bring all equipment for your overnight stay.   Contacts, glasses, dentures or bridgework may not be worn into  surgery.      For patients admitted to the hospital, discharge time will be determined by your treatment team.   Patients discharged the day of surgery will not be allowed to drive home, and someone needs to stay with them for 24 hours.  Special instructions:   [place]- Preparing For Surgery  Before surgery, you can play an important role. Because skin is not sterile, your skin needs to be as free of germs as possible. You can reduce the number of germs on your skin by washing with CHG (chlorahexidine gluconate) Soap before surgery.  CHG is an antiseptic cleaner which kills germs and bonds with the skin to continue killing germs even after washing.    Oral Hygiene is also important to reduce your risk of infection.  Remember - BRUSH YOUR TEETH THE MORNING OF SURGERY WITH YOUR REGULAR TOOTHPASTE  Please do not use if you have an allergy to CHG or antibacterial soaps. If your skin becomes reddened/irritated stop using the CHG.  Do not shave (including legs and underarms) for at least 48 hours prior to first CHG shower. It is OK to shave your face.  Please follow these instructions carefully.   1. Shower the NIGHT BEFORE SURGERY and the MORNING OF SURGERY with CHG Soap.   2. If you chose to wash your hair, wash your hair first as usual with your normal shampoo.  3. After you shampoo, rinse your hair and body thoroughly to remove the shampoo.  4. Use CHG as you would any other liquid soap. You can apply CHG directly to the skin and wash gently with a scrungie or a clean washcloth.   5. Apply the CHG Soap to your body ONLY FROM THE NECK DOWN.  Do not use on open wounds or open sores. Avoid contact with your eyes, ears, mouth and genitals (private parts). Wash Face and genitals (private parts)  with your normal soap.   6. Wash thoroughly, paying special attention to the area where your surgery will be performed.  7. Thoroughly rinse your body with warm water from the neck down.  8. DO  NOT shower/wash with your normal soap after using and rinsing off the CHG Soap.  9. Pat yourself dry with a CLEAN TOWEL.  10. Wear CLEAN PAJAMAS to bed the night before surgery, wear comfortable clothes the morning of surgery  11. Place CLEAN SHEETS on your bed the night of your first shower and DO NOT SLEEP WITH PETS.  Day of Surgery: Shower with CHG soap as instructed above.  Do not apply any deodorants/lotions.  Please wear clean clothes to the hospital/surgery center.   Remember to brush your teeth WITH YOUR REGULAR TOOTHPASTE.   Please read over the following fact sheets that you were given.

## 2019-06-12 NOTE — H&P (Signed)
 Kelly Beltran Appointment: 05/24/2019 9:00 AM Location: [facility] Patient #: [id number] DOB: 02/02/1956 Undefined / Language: English / Race: White Female   History of Present Illness (Faera Byerly MD; 05/31/2019 5:37 PM) The patient is a 64 year old female who presents with breast cancer.Pt is a lovely 64 yo F who presents with a new diagnosis of right breast cancer. She is referred by Dr. Bertrand for this diagnosis. She had a screening detected mass. She then received diagnostic imaging with a 6 mm mass seen in the UOQ of the right breast. 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 Ki 67 is 10%. Her mother had breast cancer dx in her 60s first.   Of note, her mother was my patient.   She has had cervical and uterine cancer. Her mother had bladder cancer, breast cancer and colon cancer. Her father had prostate cancer.   She is a G1P1 with first child at age 38. She had menarche at age 11. Menopause occurred at time of hysterectomy. She did not take OCPs or HRT. She has not had a colonoscopy.   Dx mammogram/us 05/04/2019. SOLIS 6 mm irregular breast mass in the upper outer quadrant at 10 o'clock. Breast density is A.  pathology Breast, right, needle core biopsy, 10 o'clock, middle depth, 7cmfn - INVASIVE MAMMARY CARCINOMA, SEE COMMENT. E-cadherin is positive consistent with a ductal phenotypeE-cadherin is positive consistent with a ductal phenotype Estrogen Receptor: 100%, POSITIVE, STRONG STAINING INTENSITY Progesterone Receptor: 0%, NEGATIVE Proliferation Marker Ki67: 10% GROUP 5: HER2 **NEGATIVE**  Labs 4/21 CBC nl, CMET wtih K 3.4.   Past Surgical History (Wendy Smith, RN; 05/24/2019 8:13 AM) Cesarean Section - 1  Gallbladder Surgery - Laparoscopic  Hysterectomy (not due to cancer) - Partial   Diagnostic Studies History (Wendy Smith, RN; 05/24/2019 8:13 AM) Colonoscopy   never Mammogram  within last year Pap Smear  1-5 years ago  Medication History (Wendy Smith, RN; 05/24/2019 8:14 AM) Medications Reconciled  Social History (Wendy Smith, RN; 05/24/2019 8:14 AM) No alcohol use  No caffeine use  No drug use  Tobacco use  Never smoker.  Family History (Wendy Smith, RN; 05/24/2019 8:14 AM) Breast Cancer  Mother. Cancer  Mother. Colon Cancer  Mother. Diabetes Mellitus  Father. Heart Disease  Mother. Hypertension  Father. Prostate Cancer  Father.  Pregnancy / Birth History (Wendy Smith, RN; 05/24/2019 8:14 AM) Age at menarche  11 years. Age of menopause  56-60 Contraceptive History  Oral contraceptives. Gravida  3 Maternal age  15-20 Para  1 Regular periods   Other Problems (Wendy Smith, RN; 05/24/2019 8:14 AM) Arthritis  Cancer  Cervical Cancer  Diabetes Mellitus  Gastroesophageal Reflux Disease  General anesthesia - complications  Melanoma  Umbilical Hernia Repair     Review of Systems (Wendy Smith RN; 05/24/2019 8:14 AM) General Not Present- Appetite Loss, Chills, Fatigue, Fever, Night Sweats, Weight Gain and Weight Loss. Skin Not Present- Change in Wart/Mole, Dryness, Hives, Jaundice, New Lesions, Non-Healing Wounds, Rash and Ulcer. HEENT Present- Seasonal Allergies and Wears glasses/contact lenses. Not Present- Earache, Hearing Loss, Hoarseness, Nose Bleed, Oral Ulcers, Ringing in the Ears, Sinus Pain, Sore Throat, Visual Disturbances and Yellow Eyes. Respiratory Not Present- Bloody sputum, Chronic Cough, Difficulty Breathing, Snoring and Wheezing. Breast Not Present- Breast Mass, Breast Pain, Nipple Discharge and Skin Changes. Cardiovascular Not Present- Chest Pain, Difficulty Breathing Lying Down, Leg Cramps, Palpitations, Rapid Heart   Rate, Shortness of Breath and Swelling of Extremities. Gastrointestinal Present- Hemorrhoids. Not Present- Abdominal Pain, Bloating, Bloody Stool, Change in Bowel Habits, Chronic  diarrhea, Constipation, Difficulty Swallowing, Excessive gas, Gets full quickly at meals, Indigestion, Nausea, Rectal Pain and Vomiting. Female Genitourinary Not Present- Frequency, Nocturia, Painful Urination, Pelvic Pain and Urgency. Musculoskeletal Present- Joint Pain. Not Present- Back Pain, Joint Stiffness, Muscle Pain, Muscle Weakness and Swelling of Extremities. Neurological Not Present- Decreased Memory, Fainting, Headaches, Numbness, Seizures, Tingling, Tremor, Trouble walking and Weakness. Psychiatric Not Present- Anxiety, Bipolar, Change in Sleep Pattern, Depression, Fearful and Frequent crying. Endocrine Present- New Diabetes. Not Present- Cold Intolerance, Excessive Hunger, Hair Changes, Heat Intolerance and Hot flashes. Hematology Not Present- Blood Thinners, Easy Bruising, Excessive bleeding, Gland problems, HIV and Persistent Infections.  Vitals (Faera Byerly MD; 05/31/2019 5:16 PM) 05/24/2019 8:01 PM Weight: 177.4 lb Height: 62in Body Surface Area: 1.82 m Body Mass Index: 32.45 kg/m  Temp.: 98.3F  Pulse: 99 (Regular)  Resp.: 18 (Unlabored)  BP: 143/80(Sitting, Left Arm, Standard)       Physical Exam (Faera Byerly MD; 05/31/2019 5:38 PM) General Mental Status-Alert. General Appearance-Consistent with stated age. Hydration-Well hydrated. Voice-Normal.  Head and Neck Head-normocephalic, atraumatic with no lesions or palpable masses. Trachea-midline. Thyroid Gland Characteristics - normal size and consistency.  Eye Eyeball - Bilateral-Extraocular movements intact. Sclera/Conjunctiva - Bilateral-No scleral icterus.  Chest and Lung Exam Chest and lung exam reveals -quiet, even and easy respiratory effort with no use of accessory muscles and on auscultation, normal breath sounds, no adventitious sounds and normal vocal resonance. Inspection Chest Wall - Normal. Back - normal.  Breast Note: ptosis bilaterally. breasts relatively  symmetric. no palpable masses. biopsy site is tender on right. no nipple retraction of nipple discharge. no LAD   Cardiovascular Cardiovascular examination reveals -normal heart sounds, regular rate and rhythm with no murmurs and normal pedal pulses bilaterally.  Abdomen Inspection Inspection of the abdomen reveals - No Hernias. Palpation/Percussion Palpation and Percussion of the abdomen reveal - Soft, Non Tender, No Rebound tenderness, No Rigidity (guarding) and No hepatosplenomegaly. Auscultation Auscultation of the abdomen reveals - Bowel sounds normal.  Neurologic Neurologic evaluation reveals -alert and oriented x 3 with no impairment of recent or remote memory. Mental Status-Normal.  Musculoskeletal Global Assessment -Note: no gross deformities.  Normal Exam - Left-Upper Extremity Strength Normal and Lower Extremity Strength Normal. Normal Exam - Right-Upper Extremity Strength Normal and Lower Extremity Strength Normal.  Lymphatic Head & Neck  General Head & Neck Lymphatics: Bilateral - Description - Normal. Axillary  General Axillary Region: Bilateral - Description - Normal. Tenderness - Non Tender. Femoral & Inguinal  Generalized Femoral & Inguinal Lymphatics: Bilateral - Description - No Generalized lymphadenopathy.    Assessment & Plan (Faera Byerly MD; 05/31/2019 5:40 PM) MALIGNANT NEOPLASM OF UPPER-OUTER QUADRANT OF RIGHT BREAST IN FEMALE, ESTROGEN RECEPTOR POSITIVE (C50.411) Impression: Pt has a new dx of right breast cancer, cT1bN0, +/-/-.  This will be treated well with a seed localized lumpectomy and sentinel lymph node biopsy. This will be followed by oncotype/mammaprint and radiation. After that, she will be recommended to take antihormonal tx.  She is being referred to genetics as well.    Signed electronically by Faera Byerly, MD (05/31/2019 5:41 PM) 

## 2019-06-12 NOTE — Progress Notes (Addendum)
Kelly Beltran denies chest pain or shortness of breath. Patient denies that she nor anyone she has been in contact has any s/s Covid. Kelly Beltran will have Covd test today and quarantine with her family.  PCP - [name] , PA-C at [facility], [facility]  Cardiologist - no  Chest x-ray - na  EKG - 06/12/2019  Stress Test - no  ECHO - no  Cardiac Cath - no  Sleep Study - no  CPAP - no  LABS-CBC, BMP,   ASA-instructed to call surgeon's office  ERAS-yes, no food after midnight midnight Clears until 1000.  HA1C-06/12/2019 Fasting Blood Sugar - rarely checks Checks Blood Sugar ___rare__ times a day  Anesthesia-  Pt denies having chest pain, sob, or fever at this time. All instructions explained to the pt, with a verbal understanding of the material. Pt agrees to go over the instructions while at home for a better understanding. Pt also instructed to self quarantine after being tested for COVID-19. The opportunity to ask questions was provided.

## 2019-06-15 ENCOUNTER — Ambulatory Visit (HOSPITAL_COMMUNITY): Payer: 59

## 2019-06-15 ENCOUNTER — Other Ambulatory Visit: Payer: Self-pay

## 2019-06-15 ENCOUNTER — Encounter (HOSPITAL_COMMUNITY): Payer: Self-pay | Admitting: General Surgery

## 2019-06-15 ENCOUNTER — Encounter (HOSPITAL_COMMUNITY)
Admission: RE | Admit: 2019-06-15 | Discharge: 2019-06-15 | Disposition: A | Discharge status: Home / Self Care | Payer: 59 | Source: Ambulatory Visit | Attending: General Surgery | Admitting: General Surgery

## 2019-06-15 ENCOUNTER — Encounter (HOSPITAL_COMMUNITY)
Admission: RE | Disposition: A | Discharge status: Home / Self Care | Payer: Self-pay | Source: Home / Self Care | Attending: General Surgery

## 2019-06-15 ENCOUNTER — Ambulatory Visit (HOSPITAL_COMMUNITY)
Admission: RE | Admit: 2019-06-15 | Discharge: 2019-06-15 | Disposition: A | Discharge status: Home / Self Care | Payer: 59 | Attending: General Surgery | Admitting: General Surgery

## 2019-06-15 DIAGNOSIS — Z7984 Long term (current) use of oral hypoglycemic drugs: Secondary | ICD-10-CM | POA: Insufficient documentation

## 2019-06-15 DIAGNOSIS — E119 Type 2 diabetes mellitus without complications: Secondary | ICD-10-CM | POA: Insufficient documentation

## 2019-06-15 DIAGNOSIS — C50411 Malignant neoplasm of upper-outer quadrant of right female breast: Secondary | ICD-10-CM

## 2019-06-15 DIAGNOSIS — Z8541 Personal history of malignant neoplasm of cervix uteri: Secondary | ICD-10-CM | POA: Insufficient documentation

## 2019-06-15 DIAGNOSIS — Z85820 Personal history of malignant melanoma of skin: Secondary | ICD-10-CM | POA: Diagnosis not present

## 2019-06-15 DIAGNOSIS — Z79899 Other long term (current) drug therapy: Secondary | ICD-10-CM | POA: Diagnosis not present

## 2019-06-15 DIAGNOSIS — Z170 Estrogen receptor positive status [ER+]: Secondary | ICD-10-CM | POA: Insufficient documentation

## 2019-06-15 DIAGNOSIS — I10 Essential (primary) hypertension: Secondary | ICD-10-CM | POA: Diagnosis not present

## 2019-06-15 HISTORY — PX: BREAST LUMPECTOMY WITH RADIOACTIVE SEED AND SENTINEL LYMPH NODE BIOPSY: SHX6550

## 2019-06-15 LAB — GLUCOSE, CAPILLARY
Glucose-Capillary: 159 mg/dL — ABNORMAL HIGH (ref 70–99)
Glucose-Capillary: 151 mg/dL — ABNORMAL HIGH (ref 70–99)

## 2019-06-15 SURGERY — BREAST LUMPECTOMY WITH RADIOACTIVE SEED AND SENTINEL LYMPH NODE BIOPSY
Anesthesia: General | Site: Breast | Laterality: Right

## 2019-06-15 MED ORDER — BUPIVACAINE HCL (PF) 0.25 % IJ SOLN
INTRAMUSCULAR | Status: AC
  Filled 2019-06-15: qty 30

## 2019-06-15 MED ORDER — PROPOFOL 10 MG/ML IV BOLUS
INTRAVENOUS | Status: AC
  Filled 2019-06-15: qty 20

## 2019-06-15 MED ORDER — DEXAMETHASONE SODIUM PHOSPHATE 4 MG/ML IJ SOLN
INTRAMUSCULAR | Status: DC | PRN
  Administered 2019-06-15: 10 mg via INTRAVENOUS

## 2019-06-15 MED ORDER — ENSURE PRE-SURGERY PO LIQD: 296.0000 mL | Freq: Once | ORAL | Status: DC

## 2019-06-15 MED ORDER — GABAPENTIN 100 MG PO CAPS: 100.0000 mg | ORAL_CAPSULE | ORAL | Status: AC

## 2019-06-15 MED ORDER — SCOPOLAMINE 1 MG/3DAYS TD PT72: 1.0000 | MEDICATED_PATCH | TRANSDERMAL | Status: DC

## 2019-06-15 MED ORDER — METHYLENE BLUE 0.5 % INJ SOLN
INTRAVENOUS | Status: AC
  Filled 2019-06-15: qty 10

## 2019-06-15 MED ORDER — ONDANSETRON HCL 4 MG/2ML IJ SOLN
INTRAMUSCULAR | Status: AC
  Filled 2019-06-15: qty 2

## 2019-06-15 MED ORDER — KETOROLAC TROMETHAMINE 15 MG/ML IJ SOLN
INTRAMUSCULAR | Status: AC
  Filled 2019-06-15: qty 1

## 2019-06-15 MED ORDER — 0.9 % SODIUM CHLORIDE (POUR BTL) OPTIME
TOPICAL | Status: DC | PRN
  Administered 2019-06-15: 1000 mL

## 2019-06-15 MED ORDER — LIDOCAINE 2% (20 MG/ML) 5 ML SYRINGE
INTRAMUSCULAR | Status: AC
  Filled 2019-06-15: qty 5

## 2019-06-15 MED ORDER — CEFAZOLIN SODIUM-DEXTROSE 2-4 GM/100ML-% IV SOLN
INTRAVENOUS | Status: AC
  Filled 2019-06-15: qty 100

## 2019-06-15 MED ORDER — OXYCODONE HCL 5 MG PO TABS
5.0000 mg | ORAL_TABLET | Freq: Once | ORAL | Status: AC | PRN
  Administered 2019-06-15: 5 mg via ORAL

## 2019-06-15 MED ORDER — OXYCODONE HCL 5 MG/5ML PO SOLN: 5.0000 mg | Freq: Once | ORAL | Status: AC | PRN

## 2019-06-15 MED ORDER — VASOPRESSIN 20 UNIT/ML IV SOLN
INTRAVENOUS | Status: AC
  Filled 2019-06-15: qty 1

## 2019-06-15 MED ORDER — FENTANYL CITRATE (PF) 100 MCG/2ML IJ SOLN: 50.0000 ug | Freq: Once | INTRAMUSCULAR | Status: AC

## 2019-06-15 MED ORDER — SCOPOLAMINE 1 MG/3DAYS TD PT72
MEDICATED_PATCH | TRANSDERMAL | Status: AC
  Administered 2019-06-15: 1.5 mg via TRANSDERMAL
  Filled 2019-06-15: qty 1

## 2019-06-15 MED ORDER — LIDOCAINE-EPINEPHRINE 1 %-1:100000 IJ SOLN
INTRAMUSCULAR | Status: DC | PRN
  Administered 2019-06-15: 20 mL

## 2019-06-15 MED ORDER — MIDAZOLAM HCL 2 MG/2ML IJ SOLN
INTRAMUSCULAR | Status: AC
  Administered 2019-06-15: 1 mg via INTRAVENOUS
  Filled 2019-06-15: qty 2

## 2019-06-15 MED ORDER — CHLORHEXIDINE GLUCONATE CLOTH 2 % EX PADS: 6.0000 | MEDICATED_PAD | Freq: Once | CUTANEOUS | Status: DC

## 2019-06-15 MED ORDER — PHENYLEPHRINE 40 MCG/ML (10ML) SYRINGE FOR IV PUSH (FOR BLOOD PRESSURE SUPPORT)
PREFILLED_SYRINGE | INTRAVENOUS | Status: AC
  Filled 2019-06-15: qty 10

## 2019-06-15 MED ORDER — ONDANSETRON HCL 4 MG/2ML IJ SOLN
INTRAMUSCULAR | Status: DC | PRN
  Administered 2019-06-15: 4 mg via INTRAVENOUS

## 2019-06-15 MED ORDER — FENTANYL CITRATE (PF) 100 MCG/2ML IJ SOLN
INTRAMUSCULAR | Status: AC
  Administered 2019-06-15: 50 ug via INTRAVENOUS
  Filled 2019-06-15: qty 2

## 2019-06-15 MED ORDER — LIDOCAINE-EPINEPHRINE 1 %-1:100000 IJ SOLN
INTRAMUSCULAR | Status: AC
  Filled 2019-06-15: qty 1

## 2019-06-15 MED ORDER — CEFAZOLIN SODIUM-DEXTROSE 2-4 GM/100ML-% IV SOLN
2.0000 g | INTRAVENOUS | Status: AC
  Administered 2019-06-15: 2 g via INTRAVENOUS

## 2019-06-15 MED ORDER — FENTANYL CITRATE (PF) 100 MCG/2ML IJ SOLN
25.0000 ug | INTRAMUSCULAR | Status: DC | PRN
  Administered 2019-06-15 (×2): 25 ug via INTRAVENOUS

## 2019-06-15 MED ORDER — PHENYLEPHRINE HCL (PRESSORS) 10 MG/ML IV SOLN
INTRAVENOUS | Status: DC | PRN
  Administered 2019-06-15: 80 ug via INTRAVENOUS
  Administered 2019-06-15: 120 ug via INTRAVENOUS

## 2019-06-15 MED ORDER — MIDAZOLAM HCL 2 MG/2ML IJ SOLN
INTRAMUSCULAR | Status: AC
  Filled 2019-06-15: qty 2

## 2019-06-15 MED ORDER — LACTATED RINGERS IV SOLN: INTRAVENOUS | Status: DC

## 2019-06-15 MED ORDER — DEXAMETHASONE SODIUM PHOSPHATE 10 MG/ML IJ SOLN: 4.0000 mg | INTRAMUSCULAR | Status: DC

## 2019-06-15 MED ORDER — FENTANYL CITRATE (PF) 100 MCG/2ML IJ SOLN
INTRAMUSCULAR | Status: AC
  Filled 2019-06-15: qty 2

## 2019-06-15 MED ORDER — MIDAZOLAM HCL 2 MG/2ML IJ SOLN: 1.0000 mg | Freq: Once | INTRAMUSCULAR | Status: AC

## 2019-06-15 MED ORDER — OXYCODONE HCL 5 MG PO TABS: 5.0000 mg | ORAL_TABLET | Freq: Four times a day (QID) | ORAL | 0 refills | Status: DC | PRN

## 2019-06-15 MED ORDER — FENTANYL CITRATE (PF) 250 MCG/5ML IJ SOLN
INTRAMUSCULAR | Status: AC
  Filled 2019-06-15: qty 5

## 2019-06-15 MED ORDER — DEXAMETHASONE SODIUM PHOSPHATE 10 MG/ML IJ SOLN
INTRAMUSCULAR | Status: AC
  Filled 2019-06-15: qty 1

## 2019-06-15 MED ORDER — LIDOCAINE HCL (CARDIAC) PF 100 MG/5ML IV SOSY
PREFILLED_SYRINGE | INTRAVENOUS | Status: DC | PRN
  Administered 2019-06-15: 30 mg via INTRAVENOUS

## 2019-06-15 MED ORDER — MIDAZOLAM HCL 5 MG/5ML IJ SOLN
INTRAMUSCULAR | Status: DC | PRN
  Administered 2019-06-15: 2 mg via INTRAVENOUS

## 2019-06-15 MED ORDER — PROPOFOL 10 MG/ML IV BOLUS
INTRAVENOUS | Status: DC | PRN
  Administered 2019-06-15: 150 mg via INTRAVENOUS

## 2019-06-15 MED ORDER — PROPOFOL 500 MG/50ML IV EMUL
INTRAVENOUS | Status: DC | PRN
Start: 2019-06-15 — End: 2019-06-15
  Administered 2019-06-15: 10 ug/kg/min via INTRAVENOUS

## 2019-06-15 MED ORDER — BUPIVACAINE HCL (PF) 0.25 % IJ SOLN
INTRAMUSCULAR | Status: DC | PRN
  Administered 2019-06-15: 20 mL

## 2019-06-15 MED ORDER — FENTANYL CITRATE (PF) 100 MCG/2ML IJ SOLN
INTRAMUSCULAR | Status: DC | PRN
  Administered 2019-06-15: 50 ug via INTRAVENOUS
  Administered 2019-06-15: 100 ug via INTRAVENOUS
  Administered 2019-06-15: 50 ug via INTRAVENOUS

## 2019-06-15 MED ORDER — OXYCODONE HCL 5 MG PO TABS
ORAL_TABLET | ORAL | Status: AC
  Filled 2019-06-15: qty 1

## 2019-06-15 MED ORDER — KETOROLAC TROMETHAMINE 15 MG/ML IJ SOLN: 15.0000 mg | INTRAMUSCULAR | Status: DC

## 2019-06-15 MED ORDER — GABAPENTIN 100 MG PO CAPS
ORAL_CAPSULE | ORAL | Status: AC
  Administered 2019-06-15: 100 mg via ORAL
  Filled 2019-06-15: qty 1

## 2019-06-15 MED ORDER — ONDANSETRON HCL 4 MG/2ML IJ SOLN: 4.0000 mg | Freq: Once | INTRAMUSCULAR | Status: DC | PRN

## 2019-06-15 SURGICAL SUPPLY — 49 items
ADH SKN CLS APL DERMABOND .7 (GAUZE/BANDAGES/DRESSINGS) ×1
APL PRP STRL LF DISP 70% ISPRP (MISCELLANEOUS) ×1
BINDER BREAST LRG (GAUZE/BANDAGES/DRESSINGS) IMPLANT
BINDER BREAST XLRG (GAUZE/BANDAGES/DRESSINGS) ×1 IMPLANT
BNDG COHESIVE 4X5 TAN STRL (GAUZE/BANDAGES/DRESSINGS) ×2 IMPLANT
CANISTER SUCT 3000ML PPV (MISCELLANEOUS) ×2 IMPLANT
CHLORAPREP W/TINT 26 (MISCELLANEOUS) ×2 IMPLANT
CLIP VESOCCLUDE LG 6/CT (CLIP) ×2 IMPLANT
CLIP VESOCCLUDE MED 6/CT (CLIP) ×2 IMPLANT
CLIP VESOCCLUDE SM WIDE 6/CT (CLIP) ×2 IMPLANT
CNTNR URN SCR LID CUP LEK RST (MISCELLANEOUS) IMPLANT
CONT SPEC 4OZ STRL OR WHT (MISCELLANEOUS)
COVER PROBE W GEL 5X96 (DRAPES) ×2 IMPLANT
COVER SURGICAL LIGHT HANDLE (MISCELLANEOUS) ×2 IMPLANT
COVER WAND RF STERILE (DRAPES) ×2 IMPLANT
DERMABOND ADVANCED (GAUZE/BANDAGES/DRESSINGS) ×1
DERMABOND ADVANCED .7 DNX12 (GAUZE/BANDAGES/DRESSINGS) ×1 IMPLANT
DEVICE DUBIN SPECIMEN MAMMOGRA (MISCELLANEOUS) IMPLANT
DRAPE CHEST BREAST 15X10 FENES (DRAPES) ×2 IMPLANT
ELECT COATED BLADE 2.86 ST (ELECTRODE) ×2 IMPLANT
ELECT NDL BLADE 2-5/6 (NEEDLE) ×1 IMPLANT
ELECT NEEDLE BLADE 2-5/6 (NEEDLE) ×2 IMPLANT
ELECT REM PT RETURN 9FT ADLT (ELECTROSURGICAL) ×2
ELECTRODE REM PT RTRN 9FT ADLT (ELECTROSURGICAL) ×1 IMPLANT
GAUZE SPONGE 4X4 12PLY STRL (GAUZE/BANDAGES/DRESSINGS) ×1 IMPLANT
GLOVE BIO SURGEON STRL SZ 6 (GLOVE) ×2 IMPLANT
GLOVE INDICATOR 6.5 STRL GRN (GLOVE) ×2 IMPLANT
GOWN STRL REUS W/ TWL LRG LVL3 (GOWN DISPOSABLE) ×1 IMPLANT
GOWN STRL REUS W/TWL 2XL LVL3 (GOWN DISPOSABLE) ×2 IMPLANT
GOWN STRL REUS W/TWL LRG LVL3 (GOWN DISPOSABLE) ×2
KIT BASIN OR (CUSTOM PROCEDURE TRAY) ×2 IMPLANT
KIT MARKER MARGIN INK (KITS) ×2 IMPLANT
LIGHT WAVEGUIDE WIDE FLAT (MISCELLANEOUS) IMPLANT
NDL 18GX1X1/2 (RX/OR ONLY) (NEEDLE) IMPLANT
NDL FILTER BLUNT 18X1 1/2 (NEEDLE) IMPLANT
NDL HYPO 25GX1X1/2 BEV (NEEDLE) ×1 IMPLANT
NEEDLE 18GX1X1/2 (RX/OR ONLY) (NEEDLE) IMPLANT
NEEDLE FILTER BLUNT 18X 1/2SAF (NEEDLE)
NEEDLE FILTER BLUNT 18X1 1/2 (NEEDLE) IMPLANT
NEEDLE HYPO 25GX1X1/2 BEV (NEEDLE) ×2 IMPLANT
NS IRRIG 1000ML POUR BTL (IV SOLUTION) ×2 IMPLANT
PACK GENERAL/GYN (CUSTOM PROCEDURE TRAY) ×2 IMPLANT
PACK UNIVERSAL I (CUSTOM PROCEDURE TRAY) ×2 IMPLANT
STOCKINETTE IMPERVIOUS 9X36 MD (GAUZE/BANDAGES/DRESSINGS) ×2 IMPLANT
SUT MNCRL AB 4-0 PS2 18 (SUTURE) ×2 IMPLANT
SUT VIC AB 3-0 SH 8-18 (SUTURE) ×2 IMPLANT
SYR CONTROL 10ML LL (SYRINGE) ×2 IMPLANT
TOWEL GREEN STERILE (TOWEL DISPOSABLE) ×2 IMPLANT
TOWEL GREEN STERILE FF (TOWEL DISPOSABLE) ×2 IMPLANT

## 2019-06-15 NOTE — Anesthesia Procedure Notes (Signed)
Procedure Name: LMA Insertion Date/Time: 06/15/2019 1:15 PM Performed by: Griffin Dakin, CRNA Pre-anesthesia Checklist: Patient identified, Emergency Drugs available, Suction available, Patient being monitored and Timeout performed Patient Re-evaluated:Patient Re-evaluated prior to induction Oxygen Delivery Method: Circle system utilized Preoxygenation: Pre-oxygenation with 100% oxygen Induction Type: IV induction LMA: LMA inserted LMA Size: 4.0 Number of attempts: 1 Tube secured with: Tape Comments: Performed by Sena Slate, SRNA

## 2019-06-15 NOTE — Discharge Instructions (Addendum)
[facility] °Office Phone Number [phone] ° °BREAST BIOPSY/ PARTIAL MASTECTOMY: POST OP INSTRUCTIONS ° °Always review your discharge instruction sheet given to you by the facility where your surgery was performed. ° °IF YOU HAVE DISABILITY OR FAMILY LEAVE FORMS, YOU MUST BRING THEM TO THE OFFICE FOR PROCESSING.  DO NOT GIVE THEM TO YOUR DOCTOR. ° °1. A prescription for pain medication may be given to you upon discharge.  Take your pain medication as prescribed, if needed.  If narcotic pain medicine is not needed, then you may take acetaminophen (Tylenol) or ibuprofen (Advil) as needed. °2. Take your usually prescribed medications unless otherwise directed °3. If you need a refill on your pain medication, please contact your pharmacy.  They will contact our office to request authorization.  Prescriptions will not be filled after 5pm or on week-ends. °4. You should eat very light the first 24 hours after surgery, such as soup, crackers, pudding, etc.  Resume your normal diet the day after surgery. °5. Most patients will experience some swelling and bruising in the breast.  Ice packs and a good support bra will help.  Swelling and bruising can take several days to resolve.  °6. It is common to experience some constipation if taking pain medication after surgery.  Increasing fluid intake and taking a stool softener will usually help or prevent this problem from occurring.  A mild laxative (Milk of Magnesia or Miralax) should be taken according to package directions if there are no bowel movements after 48 hours. °7. Unless discharge instructions indicate otherwise, you may remove your bandages 48 hours after surgery, and you may shower at that time.  You may have steri-strips (small skin tapes) in place directly over the incision.  These strips should be left on the skin for 7-10 days.   Any sutures or staples will be removed at the office during your follow-up visit. °8. ACTIVITIES:  You may resume  regular daily activities (gradually increasing) beginning the next day.  Wearing a good support bra or sports bra (or the breast binder) minimizes pain and swelling.  You may have sexual intercourse when it is comfortable. °a. You may drive when you no longer are taking prescription pain medication, you can comfortably wear a seatbelt, and you can safely maneuver your car and apply brakes. °b. RETURN TO WORK:  __________1 week_______________ °9. You should see your doctor in the office for a follow-up appointment approximately two weeks after your surgery.  Your doctor’s nurse will typically make your follow-up appointment when she calls you with your pathology report.  Expect your pathology report 2-3 business days after your surgery.  You may call to check if you do not hear from us after three days. ° ° °WHEN TO CALL YOUR DOCTOR: °1. Fever over 101.0 °2. Nausea and/or vomiting. °3. Extreme swelling or bruising. °4. Continued bleeding from incision. °5. Increased pain, redness, or drainage from the incision. ° °The clinic staff is available to answer your questions during regular business hours.  Please don’t hesitate to call and ask to speak to one of the nurses for clinical concerns.  If you have a medical emergency, go to the nearest emergency room or call 911.  A surgeon from [facility] is always on call at the hospital. ° °For further questions, please visit [email or web address]  ° °

## 2019-06-15 NOTE — Transfer of Care (Signed)
Immediate Anesthesia Transfer of Care Note  Patient: Kelly Beltran  Procedure(s) Performed: RIGHT BREAST LUMPECTOMY WITH RADIOACTIVE SEED AND SENTINEL LYMPH NODE BIOPSY (Right Breast)  Patient Location: PACU  Anesthesia Type:General  Level of Consciousness: awake, alert  and oriented  Airway & Oxygen Therapy: Patient Spontanous Breathing and Patient connected to face mask oxygen  Post-op Assessment: Report given to RN and Post -op Vital signs reviewed and stable  Post vital signs: Reviewed and stable  Last Vitals:  Vitals Value Taken Time  BP 129/91 06/15/19 1518  Temp    Pulse 97 06/15/19 1520  Resp 25 06/15/19 1520  SpO2 97 % 06/15/19 1520  Vitals shown include unvalidated device data.  Last Pain:  Vitals:   06/15/19 1123  TempSrc:   PainSc: 0-No pain         Complications: No apparent anesthesia complications

## 2019-06-15 NOTE — Anesthesia Postprocedure Evaluation (Signed)
Anesthesia Post Note  Patient: Kelly Beltran  Procedure(s) Performed: RIGHT BREAST LUMPECTOMY WITH RADIOACTIVE SEED AND SENTINEL LYMPH NODE BIOPSY (Right Breast)     Patient location during evaluation: PACU Anesthesia Type: General and Regional Level of consciousness: awake and alert Pain management: pain level controlled Vital Signs Assessment: post-procedure vital signs reviewed and stable Respiratory status: spontaneous breathing, nonlabored ventilation, respiratory function stable and patient connected to nasal cannula oxygen Cardiovascular status: blood pressure returned to baseline and stable Postop Assessment: no apparent nausea or vomiting Anesthetic complications: no    Last Vitals:  Vitals:   06/15/19 1603 06/15/19 1618  BP: 124/82 137/83  Pulse: (!) 102 100  Resp: 20 17  Temp:  36.7 C  SpO2: 92% 92%    Last Pain:  Vitals:   06/15/19 1618  TempSrc:   PainSc: 4                  Shelbey Spindler COKER

## 2019-06-15 NOTE — Op Note (Signed)
Right Breast Radioactive seed localized lumpectomy and sentinel lymph node biopsy  Indications: This patient presents with history of right breast cancer, UOQ, +/-/-, grade 2 invasive mammary carcinoma, ductal phenotype, cT1bN0  Pre-operative Diagnosis: Right breast cancer  Post-operative Diagnosis: Same  Surgeon: Stark Klein   Anesthesia: General endotracheal anesthesia  ASA Class: 2  Procedure Details  The patient was seen in the Holding Room. The risks, benefits, complications, treatment options, and expected outcomes were discussed with the patient. The possibilities of bleeding, infection, the need for additional procedures, failure to diagnose a condition, and creating a complication requiring transfusion or operation were discussed with the patient. The patient concurred with the proposed plan, giving informed consent.  The site of surgery properly noted/marked. The patient was taken to Operating Room # 2, identified, and the procedure verified as right Breast Lumpectomy with sentinel node biopsy. A Time Out was held and the above information confirmed.  The right arm, breast, and chest were prepped and draped in standard fashion. The lumpectomy was performed by creating a lateral circumareolar incision near the previously placed radioactive seed.  Dissection was carried down to around the point of maximum signal intensity. The cautery was used to perform the dissection.  Hemostasis was achieved with cautery. The edges of the cavity were marked with large clips, with one each medial, lateral, inferior and superior, and two clips posteriorly.   The specimen was inked with the margin marker paint kit.    Specimen radiography confirmed inclusion of the mammographic lesion, the clip, and the seed.  The background signal in the breast was zero.  The wound was irrigated and closed with 3-0 vicryl in layers and 4-0 monocryl subcuticular suture.    Using a hand-held gamma probe, right axillary  sentinel nodes were identified transcutaneously.  An oblique incision was created below the axillary hairline.  Dissection was carried through the clavipectoral fascia.  Four deep level 2 axillary sentinel nodes were removed.  Counts per second were 6600, 80, 580, and 120.    The background count was 10 cps.  The wound was irrigated.  Hemostasis was achieved with cautery.  The axillary incision was closed with a 3-0 vicryl deep dermal interrupted sutures and a 4-0 monocryl subcuticular closure.    Sterile dressings were applied. At the end of the operation, all sponge, instrument, and needle counts were correct.  Findings: grossly clear surgical margins and no adenopathy, anterior margin is skin.    Estimated Blood Loss:  min         Specimens: right breast lumpectomy with seed and four axillary sentinel lymph nodes.             Complications:  None; patient tolerated the procedure well.         Disposition: PACU - hemodynamically stable.         Condition: stable

## 2019-06-15 NOTE — Interval H&P Note (Signed)
History and Physical Interval Note:  06/15/2019 12:55 PM  Kelly Beltran  has presented today for surgery, with the diagnosis of RIGHT BREAST CANCER.  The various methods of treatment have been discussed with the patient and family. After consideration of risks, benefits and other options for treatment, the patient has consented to  Procedure(s) with comments: RIGHT BREAST LUMPECTOMY WITH RADIOACTIVE SEED AND SENTINEL LYMPH NODE BIOPSY (Right) - COMBINED WITH REGIONAL FOR POST OP PAIN as a surgical intervention.  The patient's history has been reviewed, patient examined, no change in status, stable for surgery.  I have reviewed the patient's chart and labs.  Questions were answered to the patient's satisfaction.     Stark Klein

## 2019-06-15 NOTE — Anesthesia Procedure Notes (Signed)
Anesthesia Regional Block: Pectoralis block   Pre-Anesthetic Checklist: ,, timeout performed, Correct Patient, Correct Site, Correct Laterality, Correct Procedure, Correct Position, site marked, Risks and benefits discussed,  Surgical consent,  Pre-op evaluation,  At surgeon's request and post-op pain management  Laterality: Right  Prep: chloraprep       Needles:  Injection technique: Single-shot  Needle Type: Echogenic Needle     Needle Length: 9cm  Needle Gauge: 21     Additional Needles:   Narrative:  Start time: 06/15/2019 11:30 AM End time: 06/15/2019 11:40 AM Injection made incrementally with aspirations every 5 mL.  Performed by: Personally  Anesthesiologist: Roberts Gaudy, MD  Additional Notes: 30 cc 0.25% Bupivacaine with 1:200 epi

## 2019-06-15 NOTE — Anesthesia Preprocedure Evaluation (Addendum)
Anesthesia Evaluation  Patient identified by MRN, date of birth, ID band Patient awake    Reviewed: Allergy & Precautions, NPO status , Patient's Chart, lab work & pertinent test results  History of Anesthesia Complications (+) PONV and history of anesthetic complications  Airway Mallampati: II  TM Distance: >3 FB Neck ROM: Full    Dental  (+) Teeth Intact, Dental Advisory Given   Pulmonary neg pulmonary ROS,    breath sounds clear to auscultation       Cardiovascular hypertension, Pt. on medications  Rhythm:Regular Rate:Normal     Neuro/Psych negative neurological ROS  negative psych ROS   GI/Hepatic Neg liver ROS, GERD  ,  Endo/Other  diabetes, Type 2, Oral Hypoglycemic Agents  Renal/GU negative Renal ROS  negative genitourinary   Musculoskeletal  (+) Arthritis ,   Abdominal   Peds  Hematology negative hematology ROS (+)   Anesthesia Other Findings Breast ca  Reproductive/Obstetrics negative OB ROS                            Anesthesia Physical Anesthesia Plan  ASA: II  Anesthesia Plan: General   Post-op Pain Management: GA combined w/ Regional for post-op pain   Induction: Intravenous  PONV Risk Score and Plan: 4 or greater and Treatment may vary due to age or medical condition, Ondansetron, Dexamethasone, Scopolamine patch - Pre-op and Midazolam  Airway Management Planned: LMA  Additional Equipment: None  Intra-op Plan:   Post-operative Plan: Extubation in OR  Informed Consent: I have reviewed the patients History and Physical, chart, labs and discussed the procedure including the risks, benefits and alternatives for the proposed anesthesia with the patient or authorized representative who has indicated his/her understanding and acceptance.     Dental advisory given  Plan Discussed with: CRNA and Anesthesiologist  Anesthesia Plan Comments:        Anesthesia  Quick Evaluation

## 2019-06-19 LAB — SURGICAL PATHOLOGY

## 2019-06-21 ENCOUNTER — Telehealth: Payer: Self-pay | Admitting: *Deleted

## 2019-06-21 ENCOUNTER — Encounter: Payer: Self-pay | Admitting: *Deleted

## 2019-06-21 NOTE — Progress Notes (Signed)
Patient Care Team: [name], PA-C as PCP - General (Physician Assistant) Mauro Kaufmann, RN as Oncology Nurse Navigator Rockwell Germany, RN as Oncology Nurse Navigator Stark Klein, MD as Consulting Physician (General Surgery) Nicholas Lose, MD as Consulting Physician (Hematology and Oncology) Gery Pray, MD as Consulting Physician (Radiation Oncology)  DIAGNOSIS:    ICD-10-CM   1. Malignant neoplasm of upper-outer quadrant of right breast in female, estrogen receptor positive ([place])  C50.411    Z17.0     SUMMARY OF ONCOLOGIC HISTORY: Oncology History  Malignant neoplasm of upper-outer quadrant of right breast in female, estrogen receptor positive ([place])  05/18/2019 Initial Diagnosis   Screening mammogram detected 0.6cm mass in the right breast, 10 o'clock position. 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   05/24/2019 Cancer Staging   Staging form: Breast, AJCC 8th Edition - Clinical stage from 05/24/2019: Stage IA (cT1b, cN0, cM0, G2, ER+, PR-, HER2-) - Signed by Nicholas Lose, MD on 05/24/2019   06/15/2019 Surgery   Right lumpectomy [facility]): IDC, grade 2, 0.8cm, with intermediate grade DCIS, clear margins, 5 right axillary lymph nodes negative.     CHIEF COMPLIANT: Follow-up s/p lumpectomy to review pathology   INTERVAL HISTORY: NYRIE SIGAL is a 64 y.o. with above-mentioned history of right breast cancer. She underwent a lumpectomy on 06/15/19 with Dr. Barry Dienes for which pathology showed invasive ductal carcinoma, grade 2, 0.8cm, with intermediate grade DCIS, clear margins, 5 right axillary lymph nodes negative for carcinoma. She presents to the clinic today to discuss the pathology report and further treatment.   ALLERGIES:  is allergic to statins; lipitor [atorvastatin calcium]; mango flavor; pravachol; sulfa antibiotics; tetracyclines & related; and tylenol  [acetaminophen].  MEDICATIONS:  Current Outpatient Medications  Medication Sig Dispense Refill  . diclofenac Sodium (VOLTAREN) 1 % GEL Apply 1 g topically in the morning and at bedtime.    Marland Kitchen esomeprazole (NEXIUM) 40 MG capsule Take 40 mg by mouth every other day.    Marland Kitchen guaiFENesin (TUSSIN) 100 MG/5ML liquid Take 200 mg by mouth 3 (three) times daily as needed for congestion.    . hydrochlorothiazide (HYDRODIURIL) 25 MG tablet Take 25 mg by mouth daily.    Marland Kitchen ibuprofen (ADVIL) 200 MG tablet Take 400 mg by mouth every 8 (eight) hours as needed (pain.).    Marland Kitchen ketotifen (ZADITOR) 0.025 % ophthalmic solution Place 1 drop into both eyes 3 (three) times daily as needed (allergy eyes.).    Marland Kitchen lisinopril (PRINIVIL,ZESTRIL) 10 MG tablet Take 10 mg by mouth daily.     Marland Kitchen loratadine (CLARITIN) 10 MG tablet Take 10 mg by mouth daily.     . metFORMIN (GLUCOPHAGE-XR) 500 MG 24 hr tablet Take 1,500 mg by mouth daily with supper.   0  . Misc Natural Products (GLUCOSAMINE CHONDROITIN TRIPLE PO) Take 2 tablets by mouth at bedtime.    Marland Kitchen oxyCODONE (OXY IR/ROXICODONE) 5 MG immediate release tablet Take 1 tablet (5 mg total) by mouth every 6 (six) hours as needed for severe pain. 15 tablet 0  . Probiotic Product (PROBIOTIC PO) Take 1 capsule by mouth daily.    . Vitamin D3 (VITAMIN D) 25 MCG tablet Take 1,000 Units by mouth daily.    . vitamin E 180 MG (400 UNITS) capsule Take 400 Units by mouth daily.    . Zinc 50 MG TABS Take 50 mg by mouth daily.  No current facility-administered medications for this visit.    PHYSICAL EXAMINATION: ECOG PERFORMANCE STATUS: 1 - Symptomatic but completely ambulatory  Vitals:   06/22/19 1136  BP: (!) 141/82  Pulse: (!) 103  Resp: 18  Temp: 98.5 F (36.9 C)  SpO2: 98%   Filed Weights   06/22/19 1136  Weight: 177 lb 1.6 oz (80.3 kg)    LABORATORY DATA:  I have reviewed the data as listed CMP Latest Ref Rng & Units 06/12/2019 05/24/2019  Glucose 70 - 99 mg/dL 170(H)  201(H)  BUN 8 - 23 mg/dL 19 15  Creatinine 0.44 - 1.00 mg/dL 0.84 0.95  Sodium 135 - 145 mmol/L 139 140  Potassium 3.5 - 5.1 mmol/L 3.5 3.4(L)  Chloride 98 - 111 mmol/L 101 100  CO2 22 - 32 mmol/L 27 26  Calcium 8.9 - 10.3 mg/dL 10.0 10.1  Total Protein 6.5 - 8.1 g/dL - 7.2  Total Bilirubin 0.3 - 1.2 mg/dL - 0.4  Alkaline Phos 38 - 126 U/L - 48  AST 15 - 41 U/L - 18  ALT 0 - 44 U/L - 12    Lab Results  Component Value Date   WBC 7.6 06/12/2019   HGB 14.1 06/12/2019   HCT 43.7 06/12/2019   MCV 86.0 06/12/2019   PLT 285 06/12/2019   NEUTROABS 3.5 05/24/2019    ASSESSMENT & PLAN:  Malignant neoplasm of upper-outer quadrant of right breast in female, estrogen receptor positive (H[facility]) 06/15/2019: Right lumpectomy: Grade 2 IDC 0.8 cm, intermediate grade DCIS, margins negative, 0/5 lymph nodes negative, ER 100%, PR 0%, Ki-67 10%, HER-2 equivocal by IHC, FISH negative ratio 1.44 T1BN0 stage Ia  Pathology counseling: I discussed the final pathology report of the patient provided  a copy of this report. I discussed the margins as well as lymph node surgeries. We also discussed the final staging along with previously performed ER/PR and HER-2/neu testing.  Treatment plan: 1. Oncotype DX testing to determine if chemotherapy would be of any benefit followed by 2. Adjuvant radiation therapy followed by 3. Adjuvant antiestrogen therapy  Return to clinic based upon Oncotype test result    No orders of the defined types were placed in this encounter.  The patient has a good understanding of the overall plan. she agrees with it. she will call with any problems that may develop before the next visit here.  Total time spent: 30 mins including face to face time and time spent for planning, charting and coordination of care  Gudena, Vinay, MD 06/22/2019  I, Molly Dorshimer, am acting as scribe for Dr. Vinay Gudena.  I have reviewed the above documentation for accuracy and completeness, and  I agree with the above.       

## 2019-06-21 NOTE — Telephone Encounter (Signed)
Ordered oncotype per Dr. Gudena. Faxed requisition to pathology. °

## 2019-06-22 ENCOUNTER — Inpatient Hospital Stay: Payer: 59 | Attending: Hematology and Oncology | Admitting: Hematology and Oncology

## 2019-06-22 ENCOUNTER — Other Ambulatory Visit: Payer: Self-pay

## 2019-06-22 DIAGNOSIS — Z79899 Other long term (current) drug therapy: Secondary | ICD-10-CM | POA: Insufficient documentation

## 2019-06-22 DIAGNOSIS — C50411 Malignant neoplasm of upper-outer quadrant of right female breast: Secondary | ICD-10-CM | POA: Insufficient documentation

## 2019-06-22 DIAGNOSIS — Z791 Long term (current) use of non-steroidal anti-inflammatories (NSAID): Secondary | ICD-10-CM | POA: Diagnosis not present

## 2019-06-22 DIAGNOSIS — Z170 Estrogen receptor positive status [ER+]: Secondary | ICD-10-CM | POA: Insufficient documentation

## 2019-06-22 DIAGNOSIS — Z7984 Long term (current) use of oral hypoglycemic drugs: Secondary | ICD-10-CM | POA: Insufficient documentation

## 2019-06-22 NOTE — Assessment & Plan Note (Signed)
06/15/2019: Right lumpectomy: Grade 2 IDC 0.8 cm, intermediate grade DCIS, margins negative, 0/5 lymph nodes negative, ER 100%, PR 0%, Ki-67 10%, HER-2 equivocal by IHC, FISH negative ratio 1.44 T1BN0 stage Ia  Pathology counseling: I discussed the final pathology report of the patient provided  a copy of this report. I discussed the margins as well as lymph node surgeries. We also discussed the final staging along with previously performed ER/PR and HER-2/neu testing.  Treatment plan: 1. Oncotype DX testing to determine if chemotherapy would be of any benefit followed by 2. Adjuvant radiation therapy followed by 3. Adjuvant antiestrogen therapy  Return to clinic based upon Oncotype test result

## 2019-07-04 ENCOUNTER — Encounter: Payer: Self-pay | Admitting: *Deleted

## 2019-07-04 ENCOUNTER — Telehealth: Payer: Self-pay | Admitting: *Deleted

## 2019-07-04 DIAGNOSIS — C50411 Malignant neoplasm of upper-outer quadrant of right female breast: Secondary | ICD-10-CM

## 2019-07-04 NOTE — Telephone Encounter (Signed)
Received oncotype results of 24/10.  Left voicemail for patient to call me back to give results. Referral placed for Dr. Sondra Come

## 2019-07-13 ENCOUNTER — Encounter: Payer: Self-pay | Admitting: Physical Therapy

## 2019-07-13 ENCOUNTER — Other Ambulatory Visit: Payer: Self-pay

## 2019-07-13 ENCOUNTER — Ambulatory Visit: Payer: 59 | Attending: General Surgery | Admitting: Physical Therapy

## 2019-07-13 ENCOUNTER — Encounter: Payer: Self-pay | Admitting: *Deleted

## 2019-07-13 DIAGNOSIS — Z483 Aftercare following surgery for neoplasm: Secondary | ICD-10-CM | POA: Insufficient documentation

## 2019-07-13 DIAGNOSIS — R293 Abnormal posture: Secondary | ICD-10-CM | POA: Insufficient documentation

## 2019-07-13 DIAGNOSIS — C50411 Malignant neoplasm of upper-outer quadrant of right female breast: Secondary | ICD-10-CM | POA: Insufficient documentation

## 2019-07-13 DIAGNOSIS — Z170 Estrogen receptor positive status [ER+]: Secondary | ICD-10-CM | POA: Diagnosis present

## 2019-07-13 NOTE — Patient Instructions (Signed)
Closed Chain: Shoulder Abduction / Adduction - on Wall    One hand on wall, step to side and return. Stepping causes shoulder to abduct and adduct. Step _5__ times, holding 5 seconds, _2__ times per day.  http://ss.exer.us/267   Copyright  VHI. All rights reserved.  Closed Chain: Shoulder Flexion / Extension - on Wall    Hands on wall, step backward. Return. Stepping causes shoulder flexion and extension Do _5__ times, holding 5 seconds, _2__ times per day.  http://ss.exer.us/265   Copyright  VHI. All rights reserved.               [facility]         [address], [place] [address]         [phone]         Annia Friendly, PT, CLT   After Breast Cancer Class It is recommended you attend the ABC class to be educated on lymphedema risk reduction. This class is free of charge and lasts for 1 hour. It is a 1-time class. Yours is scheduled for June 21st at 11:00. We will send you a link for this class. If you don't receive it by the morning of 07/24/2019, please call us at [phone].   Scar massage  Begin scar massage on your incisions to try to reduce the bumpiness of your scars. Use coconut oil or cream with Vitamin E in it.   Home exercise Program Do your exercises until you can do them without feeling any tightness at the end.   Follow up PT: It is recommended you return every 3 months for the first 3 years following surgery to be assessed on the SOZO machine for an L-Dex score. This helps prevent clinically significant lymphedema in 95% of patients. These follow up screens are 15 minute appointments that you are not billed for.

## 2019-07-13 NOTE — Therapy (Signed)
[place], [place], [address] Phone: [phone]   Fax:  [phone]  Physical Therapy Treatment  Patient Details  Name: Kelly Beltran MRN: [id number] Date of Birth: 09/23/1955 Referring Provider (PT): Dr. Stark Klein   Encounter Date: 07/13/2019   PT End of Session - 07/13/19 0903    Visit Number 2    Number of Visits 2    PT Start Time 0851    PT Stop Time 0928    PT Time Calculation (min) 37 min    Activity Tolerance Patient tolerated treatment well    Behavior During Therapy [facility] for tasks assessed/performed           Past Medical History:  Diagnosis Date  . 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 Arthritis    osteo arthritis  . Breast CA ([place])    Right Breast  Cancer  . Cancer ([place])   . Diabetes mellitus    TYPE 2  . Elevated cholesterol   . Family history of bladder cancer   . Family history of breast cancer   . Family history of colon cancer   . Family history of leukemia   . Family history of prostate cancer   . Fibroid   . GERD (gastroesophageal reflux disease)   . History of kidney stones    06/12/2019  Passed one 30monthago in her 20's had7 , passed all but 1  . Hypertension   . PONV (postoperative nausea and vomiting)    woke during lithrosplasty, Epidural with child birth - "itching"  . Uterine cancer ([facility]     Past Surgical History:  Procedure Laterality Date  . ABDOMINAL HYSTERECTOMY  1997   REPAIR OF CYSTOTOMY  . BREAST LUMPECTOMY WITH RADIOACTIVE SEED AND SENTINEL LYMPH NODE BIOPSY Right 06/15/2019   Procedure: RIGHT BREAST LUMPECTOMY WITH RADIOACTIVE SEED AND SENTINEL LYMPH NODE BIOPSY;  Surgeon: BStark Klein MD;  [facility]  Service: General;  Laterality: Right;  COMBINED WITH REGIONAL FOR POST OP PAIN  . CERVIX LESION DESTRUCTION    . CHOLECYSTECTOMY    . COLPOSCOPY    . LITHOTRIPSY      There were no vitals filed for this visit.    Subjective Assessment - 07/13/19 0852    Subjective Patient underwent a right lumpectomy and sentinel node biopsy (5 negative nodes) on 06/15/2019. She will undergo radiation and will talk with her doctor about anti-estrogen therapy.    Pertinent History Patient was diagnosed on 04/27/2019 with right grade II invasive ductal carcinoma breast cancer. It is ER positive, PR negative, and HER2 negative with a Ki67 of 10%. Patient underwent a right lumpectomy and sentinel node biopsy (5 negative nodes) on 06/15/2019.    Patient Stated Goals See if my arm is ok    Currently in Pain? No/denies              [facility] 0001      Assessment   Medical Diagnosis Right breast cancer    Referring Provider (PT) Dr. FStark Klein   Onset Date/Surgical Date 06/15/19    Hand Dominance Right    Prior Therapy Baselines      Precautions   Precautions Other (comment)    Precaution Comments active cancer      Restrictions   Weight Bearing Restrictions No      Balance Screen   Has the patient fallen in the past 6 months  No    Has the patient had a decrease in activity level because of a fear of falling?  No    Is the patient reluctant to leave their home because of a fear of falling?  No      Home Ecologist residence    Living Arrangements Spouse/significant other    Available Help at Discharge Family      Prior Function   Level of [place] Retired    Leisure She is doing arm stretches but no cardio exercise      Cognition   Overall Cognitive Status Within Functional Limits for tasks assessed      Observation/Other Assessments   Observations Right axillary and breast incision both appear to be very well healed. They are lumpy with scar tissue present.       Posture/Postural Control   Posture/Postural Control Postural limitations    Postural Limitations Rounded Shoulders;Forward head      ROM / Strength   AROM /  PROM / Strength AROM      AROM   AROM Assessment Site Shoulder    Right/Left Shoulder Right    Right Shoulder Extension 45 Degrees    Right Shoulder Flexion 140 Degrees    Right Shoulder ABduction 138 Degrees    Right Shoulder Internal Rotation 66 Degrees    Right Shoulder External Rotation 77 Degrees             LYMPHEDEMA/ONCOLOGY QUESTIONNAIRE - 07/13/19 0001      Type   Cancer Type Right breast cancer      Surgeries   Lumpectomy Date 06/15/19    Sentinel Lymph Node Biopsy Date 06/15/19    Number Lymph Nodes Removed 5      Treatment   Active Chemotherapy Treatment No    Past Chemotherapy Treatment No    Active Radiation Treatment No    Past Radiation Treatment No    Current Hormone Treatment No    Past Hormone Therapy No      What other symptoms do you have   Are you Having Heaviness or Tightness Yes    Are you having Pain No    Are you having pitting edema No    Is it Hard or Difficult finding clothes that fit No    Do you have infections No    Is there Decreased scar mobility No    [place] Sign No      Lymphedema Assessments   Lymphedema Assessments Upper extremities      Right Upper Extremity Lymphedema   10 cm Proximal to Olecranon Process 26.2 cm    Olecranon Process 23.1 cm    10 cm Proximal to Ulnar Styloid Process 20.9 cm    Just Proximal to Ulnar Styloid Process 14.4 cm    Across Hand at [employer] 18 cm    At [place] of 2nd Digit 5.9 cm      Left Upper Extremity Lymphedema   10 cm Proximal to Olecranon Process 26.6 cm    Olecranon Process 23.3 cm    10 cm Proximal to Ulnar Styloid Process 20.9 cm    Just Proximal to Ulnar Styloid Process 14.3 cm    Across Hand at [employer] 17.6 cm    At [place] of 2nd Digit 5.7 cm              Quick Dash - 07/13/19 0001    Open a tight or  new jar Mild difficulty    Do heavy household chores (wash walls, wash floors) Mild difficulty    Carry a shopping bag or briefcase No difficulty    Wash your  back No difficulty    Use a knife to cut food No difficulty    Recreational activities in which you take some force or impact through your arm, shoulder, or hand (golf, hammering, tennis) Mild difficulty    During the past week, to what extent has your arm, shoulder or hand problem interfered with your normal social activities with family, friends, neighbors, or groups? Slightly    During the past week, to what extent has your arm, shoulder or hand problem limited your work or other regular daily activities Not at all    Arm, shoulder, or hand pain. None    Tingling (pins and needles) in your arm, shoulder, or hand None    Difficulty Sleeping No difficulty    DASH Score 9.09 %                          PT Education - 07/13/19 0858    Education Details Lymphedema risk reduction and reviewed HEP; scar massage    Person(s) Educated Patient    Methods Explanation;Demonstration    Comprehension Verbalized understanding;Returned demonstration               PT Long Term Goals - 07/13/19 0932      PT LONG TERM GOAL #1   Title Patient will demonstrate she has regained full shoulder ROM and function post operatively compared to baselines.    Time 8    Period Weeks    Status Partially Met                 Plan - 07/13/19 7510    Clinical Impression Statement Patient is doing well s/p right lumpectomy and sentinel node biopsy (5 negative nodes) on 06/15/2019. She will begin radiation soon and will possibly do anti-estrogen therapy. She is doing well overall with no c/o pain, no shoulder ROM limitations, and no functional deficits that she reports. She plans to participate in the ABC class for lymphedema risk reduction education but otherwise has no PT needs at this time.    PT Treatment/Interventions ADLs/Self Care Home Management;Therapeutic exercise;Patient/family education    PT Next Visit Plan D/C - will do L-Dex screens every 3 months    PT Home Exercise Plan Post  op shoulder ROM HEP and closed chain flexion and abduction    Consulted and Agree with Plan of Care Patient           Patient will benefit from skilled therapeutic intervention in order to improve the following deficits and impairments:  Postural dysfunction, Decreased range of motion, Decreased knowledge of precautions, Impaired UE functional use, Pain  Visit Diagnosis: Malignant neoplasm of upper-outer quadrant of right breast in female, estrogen receptor positive ([place])  Abnormal posture  Aftercare following surgery for neoplasm     Problem List Patient Active Problem List   Diagnosis Date Noted  . Family history of breast cancer   . Family history of bladder cancer   . Family history of colon cancer   . Family history of prostate cancer   . Family history of leukemia   . Malignant neoplasm of upper-outer quadrant of right breast in female, estrogen receptor positive ([place]) 05/18/2019  . Menopausal state 03/30/2012  . 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  Elevated cholesterol   . Kidney stone    PHYSICAL THERAPY DISCHARGE SUMMARY  Visits from Start of Care: 2  Current functional level related to goals / functional outcomes: Goals partially met. She is lacking 9 degrees of both active flexion and abduction. See above for objective measurements.   Remaining deficits: Mild ROM deficits.   Education / Equipment: Scar massage; lymphedema risk reduction education; HEP Plan: Patient agrees to discharge.  Patient goals were partially met. Patient is being discharged due to being pleased with the current functional level.  ?????         Annia Friendly, [place] 07/13/19 9:34 AM   [place], [place], [address] Phone: [phone]   Fax:  [phone]  Name: Kelly Beltran MRN: [id number] Date of Birth: 06-01-1955

## 2019-08-04 NOTE — Progress Notes (Signed)
Patient here for a f/u new consult with Dr. Sondra Come. Nicholas Lose, MD  Physician  Specialty:  Hematology and Oncology  Assessment & Plan Note  Written  Encounter Date:  06/22/2019          Related Problems  Malignant neoplasm of upper-outer quadrant of right breast in female, estrogen receptor positive ([place])      Written         Show:Clear all '[x]' Manual'[x]' Template'[x]' Copied  Added by: '[x]' Nicholas Lose, MD  '[]' Hover for details 06/15/2019: Right lumpectomy: Grade 2 IDC 0.8 cm, intermediate grade DCIS, margins negative, 0/5 lymph nodes negative, ER 100%, PR 0%, Ki-67 10%, HER-2 equivocal by IHC, FISH negative ratio 1.44 T1BN0 stage Ia  Pathology counseling: I discussed the final pathology report of the patient provided  a copy of this report. I discussed the margins as well as lymph node surgeries. We also discussed the final staging along with previously performed ER/PR and HER-2/neu testing.  Treatment plan: 1. Oncotype DX testing to determine if chemotherapy would be of any benefit followed by 2. Adjuvant radiation therapy followed by 3. Adjuvant antiestrogen therapy  Return to clinic based upon Oncotype test result        Electronically signed by Nicholas Lose, MD at 06/22/2019 8:33 AM    Past/Anticipated interventions by medical oncology, if any: no  Lymphedema issues, if any: no  Pain issues, if any: no  SAFETY ISSUES:  Prior radiation? no  Pacemaker/ICD? no  Possible current pregnancy? hysterectomy Is the patient on methotrexate? no    De Burrs, RN 08/04/2019,12:06 PM BP 128/76 (BP Location: Left Arm, Patient Position: Sitting)   Pulse 91   Temp 98.6 F (37 C) (Oral)   Resp 18   Ht '5\' 2"'  (1.575 m)   Wt 180 lb 4 oz (81.8 kg)   SpO2 98%   BMI 32.97 kg/m   Wt Readings from Last 3 Encounters:  08/09/19 180 lb 4 oz (81.8 kg)  06/22/19 177 lb 1.6 oz (80.3 kg)  06/15/19 178 lb 12.8 oz (81.1 kg)

## 2019-08-08 NOTE — Progress Notes (Signed)
Radiation Oncology         [phone] ________________________________  Name: Kelly Beltran MRN: [id number]  Date: 08/09/2019  DOB: 1955-05-01  Re-Evaluation Note  CC: [name], Gennette Pac, MD    ICD-10-CM   1. Malignant neoplasm of upper-outer quadrant of right breast in female, estrogen receptor positive ([place])  C50.411    Z17.0     Diagnosis: Stage IA (pT1b, pN0) Right Breast UOQ, Invasive Ductal Carcinoma with intermediate grade DCIS, ER+ / PR- / Her2-, Grade 2  Narrative: The patient returns today to discuss radiation treatment options. She was seen in the multidisciplinary breast clinic on 05/24/2019. At that time, it was recommended that she proceed with genetic testing, right lumpectomy with sentinel node biopsy, oncoptype DX, adjuvant radiation therapy, and aromatase inhibitor.  Since consultation, she did not wish to pursue genetic testing.  She proceeded with a right breast lumpectomy and sentinel lymph node biopsy on 06/15/2019 performed by Dr. Barry Dienes. Pathology from the procedure revealed grade 2 invasive ductal carcinoma with intermediate nuclear grade ductal carcinoma in-situ. The margins of resection were not involved. Five right axillary sentinel lymph nodes were biopsied and all were negative for carcinoma.  The patient was seen by Dr. Lindi Adie on 06/22/2019, during which time they discussed Oncotype DX testing to determine if chemotherapy would be of any benefit. That would then be followed by adjuvant radiation therapy and then adjuvant antiestrogen therapy.  Oncotype DX was obtained on the final surgical sample and the recurrence score of 24 predicts a risk of recurrence outside the breast over the next 9 years of 10%, if the patient's only systemic therapy is an antiestrogen for 5 years.  On review of systems, the patient reports no complaints. She denies pain, lymphedema, and any other symptoms.    Allergies:  is allergic to statins, lipitor  [atorvastatin calcium], mango flavor, pravachol, sulfa antibiotics, tetracyclines & related, and tylenol [acetaminophen].  Meds: Current Outpatient Medications  Medication Sig Dispense Refill  . diclofenac Sodium (VOLTAREN) 1 % GEL Apply 1 g topically in the morning and at bedtime.    Marland Kitchen esomeprazole (NEXIUM) 40 MG capsule Take 40 mg by mouth every other day.    Marland Kitchen guaiFENesin (TUSSIN) 100 MG/5ML liquid Take 200 mg by mouth 3 (three) times daily as needed for congestion.    . hydrochlorothiazide (HYDRODIURIL) 25 MG tablet Take 25 mg by mouth daily.    Marland Kitchen ibuprofen (ADVIL) 200 MG tablet Take 400 mg by mouth every 8 (eight) hours as needed (pain.).    Marland Kitchen ketotifen (ZADITOR) 0.025 % ophthalmic solution Place 1 drop into both eyes 3 (three) times daily as needed (allergy eyes.).    Marland Kitchen lisinopril (PRINIVIL,ZESTRIL) 10 MG tablet Take 10 mg by mouth daily.     Marland Kitchen loratadine (CLARITIN) 10 MG tablet Take 10 mg by mouth daily.     . metFORMIN (GLUCOPHAGE-XR) 500 MG 24 hr tablet Take 1,500 mg by mouth daily with supper.   0  . Misc Natural Products (GLUCOSAMINE CHONDROITIN TRIPLE PO) Take 2 tablets by mouth at bedtime.    Marland Kitchen oxyCODONE (OXY IR/ROXICODONE) 5 MG immediate release tablet Take 1 tablet (5 mg total) by mouth every 6 (six) hours as needed for severe pain. 15 tablet 0  . Probiotic Product (PROBIOTIC PO) Take 1 capsule by mouth daily.    . Vitamin D3 (VITAMIN D) 25 MCG tablet Take 1,000 Units by mouth daily.    . vitamin E 180 MG (400 UNITS)  capsule Take 400 Units by mouth daily.    . Zinc 50 MG TABS Take 50 mg by mouth daily.     No current facility-administered medications for this encounter.    Physical Findings: The patient is in no acute distress. Patient is alert and oriented.  height is '5\' 2"'  (1.575 m) and weight is 180 lb 4 oz (81.8 kg). Her oral temperature is 98.6 F (37 C). Her blood pressure is 128/76 and her pulse is 91. Her respiration is 18 and oxygen saturation is 98%.  No  significant changes. Lungs are clear to auscultation bilaterally. Heart has regular rate and rhythm. No palpable cervical, supraclavicular, or axillary adenopathy. Abdomen soft, non-tender, normal bowel sounds. Left breast: no palpable mass, nipple discharge or bleeding. Right breast: Lumpectomy scar well-healed.  Patient has axillary scar which is also healed well.  No signs of infection within the breast no nipple discharge or bleeding  Lab Findings: Lab Results  Component Value Date   WBC 7.6 06/12/2019   HGB 14.1 06/12/2019   HCT 43.7 06/12/2019   MCV 86.0 06/12/2019   PLT 285 06/12/2019    Radiographic Findings: No results found.  Impression: Stage IA (pT1b, pN0) Right Breast UOQ, Invasive Ductal Carcinoma with intermediate grade DCIS, ER+ / PR- / Her2-, Grade 2  The patient would be a good candidate for adjuvant radiation therapy as a component of breast conserving therapy.  I discussed the general course of treatment side effects and potential toxicities of radiation therapy in the situation with the patient.  She appears to understand and wishes to proceed with planned course of treatment.  Plan:  Patient is scheduled for CT simulation later today.  Treatments to begin next week.  We will use  hypofractionated accelerated radiation therapy if technically possible.  Total time spent in this encounter was 35 minutes which included reviewing the patient's most recent lumpectomy, pathology report, follow-up, testing, physical examination, and documentation.  -----------------------------------  Blair Promise, PhD, MD  This document serves as a record of services personally performed by Gery Pray, MD. It was created on his behalf by Clerance Lav, a trained medical scribe. The creation of this record is based on the scribe's personal observations and the provider's statements to them. This document has been checked and approved by the attending provider.

## 2019-08-08 NOTE — Progress Notes (Signed)
  Radiation Oncology         [phone] ________________________________  Name: Kelly Beltran MRN: [id number]  Date: 08/09/2019  DOB: 1955-07-30  SIMULATION AND TREATMENT PLANNING NOTE    ICD-10-CM   1. Malignant neoplasm of upper-outer quadrant of right breast in female, estrogen receptor positive ([place])  C50.411    Z17.0     DIAGNOSIS: Stage IA (pT1b, pN0) Right Breast UOQ, Invasive Ductal Carcinoma with intermediate grade DCIS, ER+ / PR- / Her2-, Grade 2  NARRATIVE:  The patient was brought to the [place].  Identity was confirmed.  All relevant records and images related to the planned course of therapy were reviewed.  The patient freely provided informed written consent to proceed with treatment after reviewing the details related to the planned course of therapy. The consent form was witnessed and verified by the simulation staff.  Then, the patient was set-up in a stable reproducible  supine position for radiation therapy.  CT images were obtained.  Surface markings were placed.  The CT images were loaded into the planning software.  Then the target and avoidance structures were contoured.  Treatment planning then occurred.  The radiation prescription was entered and confirmed.  Then, I designed and supervised the construction of a total of 5 medically necessary complex treatment devices.  I have requested : 3D Simulation  I have requested a DVH of the following structures: heart, lungs, lumpectomy cavity.  I have ordered:dose calc.  PLAN:  The patient will receive 40.5 Gy in 15 fractions directed to the right breast followed by a boost to the lumpectomy cavity of 12 Gy in 6 fractions   Optical Surface Tracking Plan:  Since intensity modulated radiotherapy (IMRT) and 3D conformal radiation treatment methods are predicated on accurate and precise positioning for treatment, intrafraction motion monitoring is medically necessary to ensure accurate and safe treatment  delivery.  The ability to quantify intrafraction motion without excessive ionizing radiation dose can only be performed with optical surface tracking. Accordingly, surface imaging offers the opportunity to obtain 3D measurements of patient position throughout IMRT and 3D treatments without excessive radiation exposure.  I am ordering optical surface tracking for this patient's upcoming course of radiotherapy. ________________________________    Blair Promise, PhD, MD  This document serves as a record of services personally performed by Gery Pray, MD. It was created on his behalf by Clerance Lav, a trained medical scribe. The creation of this record is based on the scribe's personal observations and the provider's statements to them. This document has been checked and approved by the attending provider.

## 2019-08-09 ENCOUNTER — Ambulatory Visit
Admission: RE | Admit: 2019-08-09 | Discharge: 2019-08-09 | Disposition: A | Discharge status: Home / Self Care | Payer: 59 | Source: Ambulatory Visit | Attending: Radiation Oncology | Admitting: Radiation Oncology

## 2019-08-09 ENCOUNTER — Other Ambulatory Visit: Payer: Self-pay

## 2019-08-09 ENCOUNTER — Encounter: Payer: Self-pay | Admitting: Radiation Oncology

## 2019-08-09 VITALS — BP 128/76 | HR 91 | Temp 98.6°F | Resp 18 | Ht 62.0 in | Wt 180.2 lb

## 2019-08-09 DIAGNOSIS — Z170 Estrogen receptor positive status [ER+]: Secondary | ICD-10-CM

## 2019-08-09 DIAGNOSIS — C50411 Malignant neoplasm of upper-outer quadrant of right female breast: Secondary | ICD-10-CM | POA: Diagnosis not present

## 2019-08-09 DIAGNOSIS — Z510 Encounter for antineoplastic radiation therapy: Secondary | ICD-10-CM | POA: Insufficient documentation

## 2019-08-10 ENCOUNTER — Encounter: Payer: Self-pay | Admitting: *Deleted

## 2019-08-11 ENCOUNTER — Telehealth: Payer: Self-pay | Admitting: Hematology and Oncology

## 2019-08-11 NOTE — Telephone Encounter (Signed)
Scheduled per 7/8 sch message. Unable to reach pt. Left voicemail with appt time and date.

## 2019-08-15 DIAGNOSIS — Z510 Encounter for antineoplastic radiation therapy: Secondary | ICD-10-CM | POA: Diagnosis not present

## 2019-08-16 NOTE — Progress Notes (Signed)
  Radiation Oncology         [phone] ________________________________  Name: TZIPPY TESTERMAN MRN: [id number]  Date: 08/17/2019  DOB: Aug 01, 1955  Simulation Verification Note    ICD-10-CM   1. Malignant neoplasm of upper-outer quadrant of right breast in female, estrogen receptor positive ([place])  C50.411    Z17.0     NARRATIVE: The patient was brought to the treatment unit and placed in the planned treatment position. The clinical setup was verified. Then port films were obtained and uploaded to the radiation oncology medical record software.  The treatment beams were carefully compared against the planned radiation fields. The position location and shape of the radiation fields was reviewed. The targeted volume of tissue appears to be appropriately covered by the radiation beams. Organs at risk appear to be excluded as planned.  Based on my personal review, I approved the simulation verification. The patient's treatment will proceed as planned.  -----------------------------------  Blair Promise, PhD, MD  This document serves as a record of services personally performed by Gery Pray, MD. It was created on his behalf by Clerance Lav, a trained medical scribe. The creation of this record is based on the scribe's personal observations and the provider's statements to them. This document has been checked and approved by the attending provider.

## 2019-08-17 ENCOUNTER — Ambulatory Visit
Admission: RE | Admit: 2019-08-17 | Discharge: 2019-08-17 | Disposition: A | Discharge status: Home / Self Care | Payer: 59 | Source: Ambulatory Visit | Attending: Radiation Oncology | Admitting: Radiation Oncology

## 2019-08-17 ENCOUNTER — Other Ambulatory Visit: Payer: Self-pay

## 2019-08-17 DIAGNOSIS — Z170 Estrogen receptor positive status [ER+]: Secondary | ICD-10-CM

## 2019-08-17 DIAGNOSIS — C50411 Malignant neoplasm of upper-outer quadrant of right female breast: Secondary | ICD-10-CM

## 2019-08-17 DIAGNOSIS — Z510 Encounter for antineoplastic radiation therapy: Secondary | ICD-10-CM | POA: Diagnosis not present

## 2019-08-18 ENCOUNTER — Ambulatory Visit
Admission: RE | Admit: 2019-08-18 | Discharge: 2019-08-18 | Disposition: A | Discharge status: Home / Self Care | Payer: 59 | Source: Ambulatory Visit | Attending: Radiation Oncology | Admitting: Radiation Oncology

## 2019-08-18 DIAGNOSIS — Z510 Encounter for antineoplastic radiation therapy: Secondary | ICD-10-CM | POA: Diagnosis not present

## 2019-08-21 ENCOUNTER — Other Ambulatory Visit: Payer: Self-pay

## 2019-08-21 ENCOUNTER — Ambulatory Visit
Admission: RE | Admit: 2019-08-21 | Discharge: 2019-08-21 | Disposition: A | Discharge status: Home / Self Care | Payer: 59 | Source: Ambulatory Visit | Attending: Radiation Oncology | Admitting: Radiation Oncology

## 2019-08-21 DIAGNOSIS — Z510 Encounter for antineoplastic radiation therapy: Secondary | ICD-10-CM | POA: Diagnosis not present

## 2019-08-22 ENCOUNTER — Other Ambulatory Visit: Payer: Self-pay

## 2019-08-22 ENCOUNTER — Ambulatory Visit
Admission: RE | Admit: 2019-08-22 | Discharge: 2019-08-22 | Disposition: A | Discharge status: Home / Self Care | Payer: 59 | Source: Ambulatory Visit | Attending: Radiation Oncology | Admitting: Radiation Oncology

## 2019-08-22 DIAGNOSIS — C50411 Malignant neoplasm of upper-outer quadrant of right female breast: Secondary | ICD-10-CM

## 2019-08-22 DIAGNOSIS — Z510 Encounter for antineoplastic radiation therapy: Secondary | ICD-10-CM | POA: Diagnosis not present

## 2019-08-22 MED ORDER — ALRA NON-METALLIC DEODORANT (RAD-ONC)
1.0000 "application " | Freq: Once | TOPICAL | Status: AC
  Administered 2019-08-22: 1 via TOPICAL

## 2019-08-22 MED ORDER — SONAFINE EX EMUL
1.0000 "application " | Freq: Two times a day (BID) | CUTANEOUS | Status: DC
  Administered 2019-08-22: 1 via TOPICAL

## 2019-08-22 NOTE — Progress Notes (Signed)
Pt here for patient teaching.  Pt given Radiation and You booklet, skin care instructions, Alra deodorant and Sonafine.  Reviewed areas of pertinence such as fatigue, hair loss, skin changes, breast tenderness and breast swelling . Pt able to give teach back of to pat skin and use unscented/gentle soap,apply Sonafine bid, avoid applying anything to skin within 4 hours of treatment, avoid wearing an under wire bra and to use an electric razor if they must shave. Pt demonstrated understanding, needs reinforcement of information given and will contact nursing with any questions or concerns.     [email or web address]

## 2019-08-23 ENCOUNTER — Other Ambulatory Visit: Payer: Self-pay

## 2019-08-23 ENCOUNTER — Ambulatory Visit
Admission: RE | Admit: 2019-08-23 | Discharge: 2019-08-23 | Disposition: A | Discharge status: Home / Self Care | Payer: 59 | Source: Ambulatory Visit | Attending: Radiation Oncology | Admitting: Radiation Oncology

## 2019-08-23 DIAGNOSIS — Z510 Encounter for antineoplastic radiation therapy: Secondary | ICD-10-CM | POA: Diagnosis not present

## 2019-08-24 ENCOUNTER — Ambulatory Visit
Admission: RE | Admit: 2019-08-24 | Discharge: 2019-08-24 | Disposition: A | Discharge status: Home / Self Care | Payer: 59 | Source: Ambulatory Visit | Attending: Radiation Oncology | Admitting: Radiation Oncology

## 2019-08-24 ENCOUNTER — Other Ambulatory Visit: Payer: Self-pay

## 2019-08-24 DIAGNOSIS — Z510 Encounter for antineoplastic radiation therapy: Secondary | ICD-10-CM | POA: Diagnosis not present

## 2019-08-25 ENCOUNTER — Ambulatory Visit
Admission: RE | Admit: 2019-08-25 | Discharge: 2019-08-25 | Disposition: A | Discharge status: Home / Self Care | Payer: 59 | Source: Ambulatory Visit | Attending: Radiation Oncology | Admitting: Radiation Oncology

## 2019-08-25 DIAGNOSIS — Z510 Encounter for antineoplastic radiation therapy: Secondary | ICD-10-CM | POA: Diagnosis not present

## 2019-08-28 ENCOUNTER — Ambulatory Visit
Admission: RE | Admit: 2019-08-28 | Discharge: 2019-08-28 | Disposition: A | Discharge status: Home / Self Care | Payer: 59 | Source: Ambulatory Visit | Attending: Radiation Oncology | Admitting: Radiation Oncology

## 2019-08-28 DIAGNOSIS — Z510 Encounter for antineoplastic radiation therapy: Secondary | ICD-10-CM | POA: Diagnosis not present

## 2019-08-29 ENCOUNTER — Other Ambulatory Visit: Payer: Self-pay

## 2019-08-29 ENCOUNTER — Ambulatory Visit
Admission: RE | Admit: 2019-08-29 | Discharge: 2019-08-29 | Disposition: A | Discharge status: Home / Self Care | Payer: 59 | Source: Ambulatory Visit | Attending: Radiation Oncology | Admitting: Radiation Oncology

## 2019-08-29 DIAGNOSIS — Z510 Encounter for antineoplastic radiation therapy: Secondary | ICD-10-CM | POA: Diagnosis not present

## 2019-08-30 ENCOUNTER — Other Ambulatory Visit: Payer: Self-pay

## 2019-08-30 ENCOUNTER — Ambulatory Visit
Admission: RE | Admit: 2019-08-30 | Discharge: 2019-08-30 | Disposition: A | Discharge status: Home / Self Care | Payer: 59 | Source: Ambulatory Visit | Attending: Radiation Oncology | Admitting: Radiation Oncology

## 2019-08-30 DIAGNOSIS — Z510 Encounter for antineoplastic radiation therapy: Secondary | ICD-10-CM | POA: Diagnosis not present

## 2019-08-31 ENCOUNTER — Ambulatory Visit
Admission: RE | Admit: 2019-08-31 | Discharge: 2019-08-31 | Disposition: A | Discharge status: Home / Self Care | Payer: 59 | Source: Ambulatory Visit | Attending: Radiation Oncology | Admitting: Radiation Oncology

## 2019-08-31 ENCOUNTER — Other Ambulatory Visit: Payer: Self-pay

## 2019-08-31 DIAGNOSIS — Z510 Encounter for antineoplastic radiation therapy: Secondary | ICD-10-CM | POA: Diagnosis not present

## 2019-09-01 ENCOUNTER — Ambulatory Visit
Admission: RE | Admit: 2019-09-01 | Discharge: 2019-09-01 | Disposition: A | Discharge status: Home / Self Care | Payer: 59 | Source: Ambulatory Visit | Attending: Radiation Oncology | Admitting: Radiation Oncology

## 2019-09-01 ENCOUNTER — Other Ambulatory Visit: Payer: Self-pay

## 2019-09-01 DIAGNOSIS — Z510 Encounter for antineoplastic radiation therapy: Secondary | ICD-10-CM | POA: Diagnosis not present

## 2019-09-04 ENCOUNTER — Other Ambulatory Visit: Payer: Self-pay

## 2019-09-04 ENCOUNTER — Ambulatory Visit
Admission: RE | Admit: 2019-09-04 | Discharge: 2019-09-04 | Disposition: A | Discharge status: Home / Self Care | Payer: PPO | Source: Ambulatory Visit | Attending: Radiation Oncology | Admitting: Radiation Oncology

## 2019-09-04 DIAGNOSIS — C50411 Malignant neoplasm of upper-outer quadrant of right female breast: Secondary | ICD-10-CM | POA: Insufficient documentation

## 2019-09-04 DIAGNOSIS — Z510 Encounter for antineoplastic radiation therapy: Secondary | ICD-10-CM | POA: Insufficient documentation

## 2019-09-04 DIAGNOSIS — Z170 Estrogen receptor positive status [ER+]: Secondary | ICD-10-CM | POA: Diagnosis not present

## 2019-09-05 ENCOUNTER — Ambulatory Visit: Payer: PPO | Admitting: Radiation Oncology

## 2019-09-05 ENCOUNTER — Ambulatory Visit
Admission: RE | Admit: 2019-09-05 | Discharge: 2019-09-05 | Disposition: A | Discharge status: Home / Self Care | Payer: PPO | Source: Ambulatory Visit | Attending: Radiation Oncology | Admitting: Radiation Oncology

## 2019-09-05 ENCOUNTER — Other Ambulatory Visit: Payer: Self-pay

## 2019-09-05 DIAGNOSIS — Z510 Encounter for antineoplastic radiation therapy: Secondary | ICD-10-CM | POA: Diagnosis not present

## 2019-09-05 DIAGNOSIS — C50411 Malignant neoplasm of upper-outer quadrant of right female breast: Secondary | ICD-10-CM | POA: Diagnosis not present

## 2019-09-06 ENCOUNTER — Other Ambulatory Visit: Payer: Self-pay

## 2019-09-06 ENCOUNTER — Ambulatory Visit
Admission: RE | Admit: 2019-09-06 | Discharge: 2019-09-06 | Disposition: A | Discharge status: Home / Self Care | Payer: PPO | Source: Ambulatory Visit | Attending: Radiation Oncology | Admitting: Radiation Oncology

## 2019-09-06 DIAGNOSIS — Z510 Encounter for antineoplastic radiation therapy: Secondary | ICD-10-CM | POA: Diagnosis not present

## 2019-09-06 DIAGNOSIS — C50411 Malignant neoplasm of upper-outer quadrant of right female breast: Secondary | ICD-10-CM | POA: Diagnosis not present

## 2019-09-07 ENCOUNTER — Ambulatory Visit
Admission: RE | Admit: 2019-09-07 | Discharge: 2019-09-07 | Disposition: A | Discharge status: Home / Self Care | Payer: PPO | Source: Ambulatory Visit | Attending: Radiation Oncology | Admitting: Radiation Oncology

## 2019-09-07 ENCOUNTER — Other Ambulatory Visit: Payer: Self-pay

## 2019-09-07 DIAGNOSIS — Z510 Encounter for antineoplastic radiation therapy: Secondary | ICD-10-CM | POA: Diagnosis not present

## 2019-09-07 DIAGNOSIS — C50411 Malignant neoplasm of upper-outer quadrant of right female breast: Secondary | ICD-10-CM | POA: Diagnosis not present

## 2019-09-08 ENCOUNTER — Ambulatory Visit
Admission: RE | Admit: 2019-09-08 | Discharge: 2019-09-08 | Disposition: A | Discharge status: Home / Self Care | Payer: PPO | Source: Ambulatory Visit | Attending: Radiation Oncology | Admitting: Radiation Oncology

## 2019-09-08 DIAGNOSIS — C50411 Malignant neoplasm of upper-outer quadrant of right female breast: Secondary | ICD-10-CM | POA: Diagnosis not present

## 2019-09-08 DIAGNOSIS — Z510 Encounter for antineoplastic radiation therapy: Secondary | ICD-10-CM | POA: Diagnosis not present

## 2019-09-10 NOTE — Progress Notes (Signed)
Patient Care Team: [name], PA-C as PCP - General (Physician Assistant) Mauro Kaufmann, RN as Oncology Nurse Navigator Rockwell Germany, RN as Oncology Nurse Navigator Stark Klein, MD as Consulting Physician (General Surgery) Nicholas Lose, MD as Consulting Physician (Hematology and Oncology) Gery Pray, MD as Consulting Physician (Radiation Oncology)  DIAGNOSIS:    ICD-10-CM   1. Malignant neoplasm of upper-outer quadrant of right breast in female, estrogen receptor positive ([place])  C50.411    Z17.0     SUMMARY OF ONCOLOGIC HISTORY: Oncology History  Malignant neoplasm of upper-outer quadrant of right breast in female, estrogen receptor positive ([place])  05/18/2019 Initial Diagnosis   Screening mammogram detected 0.6cm mass in the right breast, 10 o'clock position. 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   05/24/2019 Cancer Staging   Staging form: Breast, AJCC 8th Edition - Clinical stage from 05/24/2019: Stage IA (cT1b, cN0, cM0, G2, ER+, PR-, HER2-) - Signed by Nicholas Lose, MD on 05/24/2019   06/15/2019 Surgery   Right lumpectomy [facility]): IDC, grade 2, 0.8cm, with intermediate grade DCIS, clear margins, 5 right axillary lymph nodes negative.   07/05/2019 Cancer Staging   Staging form: Breast, AJCC 8th Edition - Pathologic: Stage IA (pT1b, pN0, cM0, G2, ER+, PR-, HER2-, Oncotype DX score: 24) - Signed by Gardenia Phlegm, NP on 07/05/2019   08/18/2019 - 09/14/2019 Radiation Therapy   Adjuvant right breast radiation     CHIEF COMPLIANT: Follow-up s/p radiation to discuss antiestrogen therapy  INTERVAL HISTORY: Kelly Beltran is a 64 y.o. with above-mentioned history of right breast cancer who underwent a lumpectomy and completed radiation on 09/08/19. She presents to the clinic today to discuss anti-estrogen therapy.  In spite of her initial reluctance against radiation she did  receive radiation.  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  She has 3 more days of radiation left.  ALLERGIES:  is allergic to statins, lipitor [atorvastatin calcium], mango flavor, pravachol, sulfa antibiotics, tetracyclines & related, and tylenol [acetaminophen].  MEDICATIONS:  Current Outpatient Medications  Medication Sig Dispense Refill  . [START ON 10/16/2019] anastrozole (ARIMIDEX) 1 MG tablet Take 1 tablet (1 mg total) by mouth daily. 90 tablet 3  . diclofenac Sodium (VOLTAREN) 1 % GEL Apply 1 g topically in the morning and at bedtime.    Marland Kitchen esomeprazole (NEXIUM) 40 MG capsule Take 40 mg by mouth every other day.    Marland Kitchen guaiFENesin (TUSSIN) 100 MG/5ML liquid Take 200 mg by mouth 3 (three) times daily as needed for congestion.    . hydrochlorothiazide (HYDRODIURIL) 25 MG tablet Take 25 mg by mouth daily.    Marland Kitchen ibuprofen (ADVIL) 200 MG tablet Take 400 mg by mouth every 8 (eight) hours as needed (pain.).    Marland Kitchen ketotifen (ZADITOR) 0.025 % ophthalmic solution Place 1 drop into both eyes 3 (three) times daily as needed (allergy eyes.).    Marland Kitchen lisinopril (PRINIVIL,ZESTRIL) 10 MG tablet Take 10 mg by mouth daily.     Marland Kitchen loratadine (CLARITIN) 10 MG tablet Take 10 mg by mouth daily.     . metFORMIN (GLUCOPHAGE-XR) 500 MG 24 hr tablet Take 1,500 mg by mouth daily with supper.   0  . Misc Natural Products (GLUCOSAMINE CHONDROITIN TRIPLE PO) Take 2 tablets by mouth at bedtime.    . Probiotic Product (PROBIOTIC PO) Take 1 capsule by mouth daily.    . Vitamin  D3 (VITAMIN D) 25 MCG tablet Take 1,000 Units by mouth daily.    . vitamin E 180 MG (400 UNITS) capsule Take 400 Units by mouth daily.    . Zinc 50 MG TABS Take 50 mg by mouth daily.     No current facility-administered medications for this visit.    PHYSICAL EXAMINATION: ECOG PERFORMANCE STATUS: 1 - Symptomatic but completely ambulatory  Vitals:   09/11/19 1327  BP: 140/78  Pulse: (!) 112  Resp: 17  Temp: 98.1  F (36.7 C)  SpO2: 98%   Filed Weights   09/11/19 1327  Weight: 179 lb 14.4 oz (81.6 kg)    LABORATORY DATA:  I have reviewed the data as listed CMP Latest Ref Rng & Units 06/12/2019 05/24/2019  Glucose 70 - 99 mg/dL 170(H) 201(H)  BUN 8 - 23 mg/dL 19 15  Creatinine 0.44 - 1.00 mg/dL 0.84 0.95  Sodium 135 - 145 mmol/L 139 140  Potassium 3.5 - 5.1 mmol/L 3.5 3.4(L)  Chloride 98 - 111 mmol/L 101 100  CO2 22 - 32 mmol/L 27 26  Calcium 8.9 - 10.3 mg/dL 10.0 10.1  Total Protein 6.5 - 8.1 g/dL - 7.2  Total Bilirubin 0.3 - 1.2 mg/dL - 0.4  Alkaline Phos 38 - 126 U/L - 48  AST 15 - 41 U/L - 18  ALT 0 - 44 U/L - 12    Lab Results  Component Value Date   WBC 7.6 06/12/2019   HGB 14.1 06/12/2019   HCT 43.7 06/12/2019   MCV 86.0 06/12/2019   PLT 285 06/12/2019   NEUTROABS 3.5 05/24/2019    ASSESSMENT & PLAN:  Malignant neoplasm of upper-outer quadrant of right breast in female, estrogen receptor positive ([place]) 06/15/2019: Right lumpectomy: Grade 2 IDC 0.8 cm, intermediate grade DCIS, margins negative, 0/5 lymph nodes negative, ER 100%, PR 0%, Ki-67 10%, HER-2 equivocal by IHC, FISH negative ratio 1.44 T1BN0 stage Ia  Adjuvant radiation 08/18/2019- 09/08/2019  Treatment plan: Adjuvant antiestrogen therapy with anastrozole 1 mg p.o. daily x5 to 7 years Anastrozole counseling:We discussed the risks and benefits of anti-estrogen therapy with aromatase inhibitors. These include but not limited to insomnia, hot flashes, mood changes, vaginal dryness, bone density loss, and weight gain. We strongly believe that the benefits far outweigh the risks. Patient understands these risks and consented to starting treatment. Planned treatment duration is 5-7 years.  She is reluctant to take antiestrogen therapy.  But she will try taking half a tablet daily and she tolerates that she will increase it to full tablet.  I provided her with a consent for the antiestrogen therapy compliance and adherence  study  Return to clinic in 3 months for survivorship care plan visit   No orders of the defined types were placed in this encounter.  The patient has a good understanding of the overall plan. she agrees with it. she will call with any problems that may develop before the next visit here.  Total time spent: 30 mins including face to face time and time spent for planning, charting and coordination of care  Nicholas Lose, MD 09/11/2019  I, Cloyde Reams Dorshimer, am acting as scribe for Dr. Nicholas Lose.  I have reviewed the above documentation for accuracy and completeness, and I agree with the above.

## 2019-09-11 ENCOUNTER — Ambulatory Visit
Admission: RE | Admit: 2019-09-11 | Discharge: 2019-09-11 | Disposition: A | Discharge status: Home / Self Care | Payer: PPO | Source: Ambulatory Visit | Attending: Radiation Oncology | Admitting: Radiation Oncology

## 2019-09-11 ENCOUNTER — Inpatient Hospital Stay: Payer: PPO | Attending: Hematology and Oncology | Admitting: Hematology and Oncology

## 2019-09-11 ENCOUNTER — Other Ambulatory Visit: Payer: Self-pay

## 2019-09-11 DIAGNOSIS — Z79899 Other long term (current) drug therapy: Secondary | ICD-10-CM | POA: Diagnosis not present

## 2019-09-11 DIAGNOSIS — C50411 Malignant neoplasm of upper-outer quadrant of right female breast: Secondary | ICD-10-CM | POA: Diagnosis not present

## 2019-09-11 DIAGNOSIS — Z923 Personal history of irradiation: Secondary | ICD-10-CM | POA: Diagnosis not present

## 2019-09-11 DIAGNOSIS — Z170 Estrogen receptor positive status [ER+]: Secondary | ICD-10-CM

## 2019-09-11 DIAGNOSIS — Z7984 Long term (current) use of oral hypoglycemic drugs: Secondary | ICD-10-CM | POA: Insufficient documentation

## 2019-09-11 DIAGNOSIS — Z510 Encounter for antineoplastic radiation therapy: Secondary | ICD-10-CM | POA: Diagnosis not present

## 2019-09-11 MED ORDER — ANASTROZOLE 1 MG PO TABS
1.0000 mg | ORAL_TABLET | Freq: Every day | ORAL | 3 refills | Status: DC
Start: 2019-10-16 — End: 2020-01-30

## 2019-09-11 NOTE — Assessment & Plan Note (Signed)
06/15/2019: Right lumpectomy: Grade 2 IDC 0.8 cm, intermediate grade DCIS, margins negative, 0/5 lymph nodes negative, ER 100%, PR 0%, Ki-67 10%, HER-2 equivocal by IHC, FISH negative ratio 1.44 T1BN0 stage Ia  Adjuvant radiation 08/18/2019- 09/08/2019  Treatment plan: Adjuvant antiestrogen therapy with anastrozole 1 mg p.o. daily x5 to 7 years Anastrozole counseling:We discussed the risks and benefits of anti-estrogen therapy with aromatase inhibitors. These include but not limited to insomnia, hot flashes, mood changes, vaginal dryness, bone density loss, and weight gain. We strongly believe that the benefits far outweigh the risks. Patient understands these risks and consented to starting treatment. Planned treatment duration is 5-7 years.

## 2019-09-12 ENCOUNTER — Ambulatory Visit
Admission: RE | Admit: 2019-09-12 | Discharge: 2019-09-12 | Disposition: A | Discharge status: Home / Self Care | Payer: PPO | Source: Ambulatory Visit | Attending: Radiation Oncology | Admitting: Radiation Oncology

## 2019-09-12 ENCOUNTER — Other Ambulatory Visit: Payer: Self-pay

## 2019-09-12 DIAGNOSIS — C50411 Malignant neoplasm of upper-outer quadrant of right female breast: Secondary | ICD-10-CM | POA: Diagnosis not present

## 2019-09-12 DIAGNOSIS — Z510 Encounter for antineoplastic radiation therapy: Secondary | ICD-10-CM | POA: Diagnosis not present

## 2019-09-13 ENCOUNTER — Ambulatory Visit
Admission: RE | Admit: 2019-09-13 | Discharge: 2019-09-13 | Disposition: A | Discharge status: Home / Self Care | Payer: PPO | Source: Ambulatory Visit | Attending: Radiation Oncology | Admitting: Radiation Oncology

## 2019-09-13 ENCOUNTER — Ambulatory Visit: Payer: PPO

## 2019-09-13 ENCOUNTER — Other Ambulatory Visit: Payer: Self-pay

## 2019-09-13 ENCOUNTER — Encounter: Payer: Self-pay | Admitting: *Deleted

## 2019-09-13 DIAGNOSIS — Z510 Encounter for antineoplastic radiation therapy: Secondary | ICD-10-CM | POA: Diagnosis not present

## 2019-09-13 DIAGNOSIS — C50411 Malignant neoplasm of upper-outer quadrant of right female breast: Secondary | ICD-10-CM | POA: Diagnosis not present

## 2019-09-14 ENCOUNTER — Ambulatory Visit
Admission: RE | Admit: 2019-09-14 | Discharge: 2019-09-14 | Disposition: A | Discharge status: Home / Self Care | Payer: PPO | Source: Ambulatory Visit | Attending: Radiation Oncology | Admitting: Radiation Oncology

## 2019-09-14 ENCOUNTER — Other Ambulatory Visit: Payer: Self-pay

## 2019-09-14 ENCOUNTER — Encounter: Payer: Self-pay | Admitting: Radiation Oncology

## 2019-09-14 DIAGNOSIS — Z510 Encounter for antineoplastic radiation therapy: Secondary | ICD-10-CM | POA: Diagnosis not present

## 2019-09-14 DIAGNOSIS — C50411 Malignant neoplasm of upper-outer quadrant of right female breast: Secondary | ICD-10-CM | POA: Diagnosis not present

## 2019-09-25 ENCOUNTER — Ambulatory Visit: Payer: PPO | Attending: General Surgery

## 2019-09-25 ENCOUNTER — Other Ambulatory Visit: Payer: Self-pay

## 2019-09-25 DIAGNOSIS — C50911 Malignant neoplasm of unspecified site of right female breast: Secondary | ICD-10-CM | POA: Diagnosis not present

## 2019-09-25 DIAGNOSIS — Z170 Estrogen receptor positive status [ER+]: Secondary | ICD-10-CM | POA: Insufficient documentation

## 2019-09-25 DIAGNOSIS — C50411 Malignant neoplasm of upper-outer quadrant of right female breast: Secondary | ICD-10-CM

## 2019-09-25 NOTE — Progress Notes (Incomplete)
  Patient Name: Kelly Beltran MRN: [id number] DOB: May 11, 1955 Referring Physician: [name] (Profile Not Attached) Date of Service: 09/14/2019 [facility] [place]                                                        End Of Treatment Note  Diagnoses: C50.411-Malignant neoplasm of upper-outer quadrant of right female breast C50.911-Malignant neoplasm of unspecified site of right female breast  Cancer Staging: StageIA (pT1b, pN0) RightBreastUOQ,Invasive DuctalCarcinoma with intermediate grade DCIS, ER+/ PR-/ Her2-, Grade2  Intent: Curative  Radiation Treatment Dates: 08/17/2019 through 09/14/2019 Site Technique Total Dose (Gy) Dose per Fx (Gy) Completed Fx Beam Energies  Breast, Right: Breast_Rt 3D 40.05/40.05 2.67 15/15 6X, 10X  Breast, Right: Breast_Rt_Bst 3D 12/12 2 6/6 6X, 10X   Narrative: The patient tolerated radiation therapy relatively well. She did report aches in her right breast, swelling at her outer right breast, and mild fatigue. During the first week of treatment, the right breast area showed some mild erythema, which developed into hyperpigmentation changes and then moist desquamation in the right inframmary fold towards the end of treatment.   Plan: The patient will follow-up with radiation oncology in one month.  ________________________________________________   Blair Promise, PhD, MD  This document serves as a record of services personally performed by Gery Pray, MD. It was created on his behalf by Clerance Lav, a trained medical scribe. The creation of this record is based on the scribe's personal observations and the provider's statements to them. This document has been checked and approved by the attending provider.

## 2019-09-25 NOTE — Therapy (Signed)
[place], [place], [address] Phone: [phone]   Fax:  [phone]  Physical Therapy Treatment  Patient Details  Name: Kelly Beltran MRN: [id number] Date of Birth: 03-23-1955 Referring Provider (PT): Dr. Stark Klein   Encounter Date: 09/25/2019   PT End of Session - 09/25/19 0954    Visit Number 2   # unchanged due to screen only   Number of Visits 2    PT Start Time 0950    PT Stop Time 0957    PT Time Calculation (min) 7 min    Activity Tolerance Patient tolerated treatment well    Behavior During Therapy [facility] for tasks assessed/performed           Past Medical History:  Diagnosis Date  . 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 Arthritis    osteo arthritis  . Breast CA ([place])    Right Breast  Cancer  . Cancer ([place])   . Diabetes mellitus    TYPE 2  . Elevated cholesterol   . Family history of bladder cancer   . Family history of breast cancer   . Family history of colon cancer   . Family history of leukemia   . Family history of prostate cancer   . Fibroid   . GERD (gastroesophageal reflux disease)   . History of kidney stones    06/12/2019  Passed one 67monthago in her 20's had7 , passed all but 1  . Hypertension   . PONV (postoperative nausea and vomiting)    woke during lithrosplasty, Epidural with child birth - "itching"  . Uterine cancer ([facility]     Past Surgical History:  Procedure Laterality Date  . ABDOMINAL HYSTERECTOMY  1997   REPAIR OF CYSTOTOMY  . BREAST LUMPECTOMY WITH RADIOACTIVE SEED AND SENTINEL LYMPH NODE BIOPSY Right 06/15/2019   Procedure: RIGHT BREAST LUMPECTOMY WITH RADIOACTIVE SEED AND SENTINEL LYMPH NODE BIOPSY;  Surgeon: BStark Klein MD;  Location: [facility]  Service: General;  Laterality: Right;  COMBINED WITH REGIONAL FOR POST OP PAIN  . CERVIX LESION DESTRUCTION    . CHOLECYSTECTOMY    . COLPOSCOPY    . LITHOTRIPSY      There were no vitals  filed for this visit.   Subjective Assessment - 09/25/19 0951    Subjective Pt returns for 3 month L-Dex screen.                  L-DEX FLOWSHEETS - 09/25/19 0900      L-DEX LYMPHEDEMA SCREENING   BASELINE SCORE (UNILATERAL) 0.2                                  PT Long Term Goals - 07/13/19 0932      PT LONG TERM GOAL #1   Title Patient will demonstrate she has regained full shoulder ROM and function post operatively compared to baselines.    Time 8    Period Weeks    Status Partially Met                 Plan - 09/25/19 0957    Clinical Impression Statement Pt returns for 3 month L-Dex screen. She was within normal range with a score of 1.7 giving her a change from baseline of 1.5. No further treatment is indicated at this time. She is agreeable to continue 3 month  screens for up to 2 years from surgery.    PT Next Visit Plan Cont 3 month L-Dex screens for 2 years-up to ~06/14/2021.    Consulted and Agree with Plan of Care Patient           Patient will benefit from skilled therapeutic intervention in order to improve the following deficits and impairments:     Visit Diagnosis: Malignant neoplasm of upper-outer quadrant of right breast in female, estrogen receptor positive ([place])     Problem List Patient Active Problem List   Diagnosis Date Noted  . Family history of breast cancer   . Family history of bladder cancer   . Family history of colon cancer   . Family history of prostate cancer   . Family history of leukemia   . Malignant neoplasm of upper-outer quadrant of right breast in female, estrogen receptor positive ([place]) 05/18/2019  . Menopausal state 03/30/2012  . 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 Elevated cholesterol   . Kidney stone     Otelia Limes, PTA 09/25/2019, 10:04 AM  [place], [place],  [address] Phone: [phone]   Fax:  [phone]  Name: Kelly Beltran MRN: [id number] Date of Birth: 05-Nov-1955

## 2019-10-10 ENCOUNTER — Telehealth: Payer: Self-pay | Admitting: *Deleted

## 2019-10-10 NOTE — Telephone Encounter (Signed)
CALLED PATIENT TO ASK ABOUT ALTERNATING FU ON 10-19-19 DUE TO DR. KINARD BEING IN THE OR, RESCHEDULED FOR 10-23-19 @ 10:45 AM, LVM FOR A RETURN CALL

## 2019-10-19 ENCOUNTER — Ambulatory Visit: Payer: Self-pay | Admitting: Radiation Oncology

## 2019-10-19 NOTE — Progress Notes (Signed)
Radiation Oncology         [phone] ________________________________  Name: Kelly Beltran MRN: [id number]  Date: 10/23/2019  DOB: 10-07-1955  Follow-Up Visit Note  CC: Kelly Beltran, Kelly Pippin, PA-C    ICD-10-CM   1. Malignant neoplasm of upper-outer quadrant of right breast in female, estrogen receptor positive ([place])  C50.411    Z17.0     Diagnosis: StageIA (pT1b, pN0) RightBreastUOQ,Invasive DuctalCarcinoma with intermediate grade DCIS, ER+/ PR-/ Her2-, Grade2  Interval Since Last Radiation: One month, one week, and one day.  Radiation Treatment Dates: 08/17/2019 through 09/14/2019 Site Technique Total Dose (Gy) Dose per Fx (Gy) Completed Fx Beam Energies  Breast, Right: Breast_Rt 3D 40.05/40.05 2.67 15/15 6X, 10X  Breast, Right: Breast_Rt_Bst 3D 12/12 2 6/6 6X, 10X   Narrative:  The patient returns today for routine follow-up. She was last seen by Dr. Lindi Adie on 09/11/2019, during which time they discussed beginning anti-estrogen therapy with Anastrozole for 5-7 years. No other significant interval history since the end of treatment.  On review of systems, she reports occasional sharp pains in her right breast. She denies nipple discharge.  She has noticed some dry skin around the nipple region.              ALLERGIES:  is allergic to statins, lipitor [atorvastatin calcium], mango flavor, pravachol, sulfa antibiotics, tetracyclines & related, and tylenol [acetaminophen].  Meds: Current Outpatient Medications  Medication Sig Dispense Refill  . anastrozole (ARIMIDEX) 1 MG tablet Take 1 tablet (1 mg total) by mouth daily. 90 tablet 3  . diclofenac Sodium (VOLTAREN) 1 % GEL Apply 1 g topically in the morning and at bedtime.    Marland Kitchen esomeprazole (NEXIUM) 40 MG capsule Take 40 mg by mouth every other day.    Marland Kitchen guaiFENesin (TUSSIN) 100 MG/5ML liquid Take 200 mg by mouth 3 (three) times daily as needed for congestion.    . hydrochlorothiazide (HYDRODIURIL) 25 MG  tablet Take 25 mg by mouth daily.    Marland Kitchen ibuprofen (ADVIL) 200 MG tablet Take 400 mg by mouth every 8 (eight) hours as needed (pain.).    Marland Kitchen ketotifen (ZADITOR) 0.025 % ophthalmic solution Place 1 drop into both eyes 3 (three) times daily as needed (allergy eyes.).    Marland Kitchen lisinopril (PRINIVIL,ZESTRIL) 10 MG tablet Take 10 mg by mouth daily.     Marland Kitchen loratadine (CLARITIN) 10 MG tablet Take 10 mg by mouth daily.     . metFORMIN (GLUCOPHAGE-XR) 500 MG 24 hr tablet Take 1,500 mg by mouth daily with supper.   0  . Misc Natural Products (GLUCOSAMINE CHONDROITIN TRIPLE PO) Take 2 tablets by mouth at bedtime.    . Probiotic Product (PROBIOTIC PO) Take 1 capsule by mouth daily.    . Vitamin D3 (VITAMIN D) 25 MCG tablet Take 1,000 Units by mouth daily.    . vitamin E 180 MG (400 UNITS) capsule Take 400 Units by mouth daily.    . Zinc 50 MG TABS Take 50 mg by mouth daily.     No current facility-administered medications for this encounter.    Physical Findings: The patient is in no acute distress. Patient is alert and oriented.  height is 5' 2" (1.575 m) and weight is 180 lb 6 oz (81.8 kg). Her oral temperature is 98.3 F (36.8 C). Her blood pressure is 131/82 and her pulse is 90. Her respiration is 20 and oxygen saturation is 98%.  No significant changes. Lungs are clear to auscultation  bilaterally. Heart has regular rate and rhythm. No palpable cervical, supraclavicular, or axillary adenopathy. Abdomen soft, non-tender, normal bowel sounds. Left breast: No palpable mass, nipple discharge, or bleeding.  Right breast: Skin is healed well.  No significant edema noted in the breast.  No nipple discharge or bleeding.  Some dry skin in the areolar region.  Lab Findings: Lab Results  Component Value Date   WBC 7.6 06/12/2019   HGB 14.1 06/12/2019   HCT 43.7 06/12/2019   MCV 86.0 06/12/2019   PLT 285 06/12/2019    Radiographic Findings: No results found.  Impression: StageIA (pT1b, pN0)  RightBreastUOQ,Invasive DuctalCarcinoma with intermediate grade DCIS, ER+/ PR-/ Her2-, Grade2  The patient is recovering from the effects of radiation.  The patient skin is healed quickly.  She denies any significant discomfort in the breast or swelling.  Patient was given samples of Aquaphor to be use.  Plan: The patient is scheduled to follow-up with Wilber Bihari, NP, on 01/11/2020. She will follow-up with radiation oncology on a as needed basis   ____________________________________   Blair Promise, PhD, MD  This document serves as a record of services personally performed by Gery Pray, MD. It was created on his behalf by Clerance Lav, a trained medical scribe. The creation of this record is based on the scribe's personal observations and the provider's statements to them. This document has been checked and approved by the attending provider.

## 2019-10-23 ENCOUNTER — Encounter: Payer: Self-pay | Admitting: Radiation Oncology

## 2019-10-23 ENCOUNTER — Other Ambulatory Visit: Payer: Self-pay

## 2019-10-23 ENCOUNTER — Ambulatory Visit
Admission: RE | Admit: 2019-10-23 | Discharge: 2019-10-23 | Disposition: A | Discharge status: Home / Self Care | Payer: PPO | Source: Ambulatory Visit | Attending: Radiation Oncology | Admitting: Radiation Oncology

## 2019-10-23 DIAGNOSIS — Z170 Estrogen receptor positive status [ER+]: Secondary | ICD-10-CM | POA: Insufficient documentation

## 2019-10-23 DIAGNOSIS — C50411 Malignant neoplasm of upper-outer quadrant of right female breast: Secondary | ICD-10-CM | POA: Diagnosis not present

## 2019-10-23 DIAGNOSIS — Z7984 Long term (current) use of oral hypoglycemic drugs: Secondary | ICD-10-CM | POA: Insufficient documentation

## 2019-10-23 DIAGNOSIS — Z79899 Other long term (current) drug therapy: Secondary | ICD-10-CM | POA: Diagnosis not present

## 2019-10-23 DIAGNOSIS — Z923 Personal history of irradiation: Secondary | ICD-10-CM | POA: Insufficient documentation

## 2019-10-23 NOTE — Progress Notes (Signed)
Patient here for a 1 month f/u visit with  Dr. Sondra Come. She denies pain but reports some brief electrical pain in her right breast. Her breast has healed from the radiation and from the blisters. She currently is wearing a compression bra.  BP 131/82 (BP Location: Left Arm, Patient Position: Sitting)   Pulse 90   Temp 98.3 F (36.8 C) (Oral)   Resp 20   Ht 5\' 2"  (1.575 m)   Wt 180 lb 6 oz (81.8 kg)   SpO2 98%   BMI 32.99 kg/m   Wt Readings from Last 3 Encounters:  10/23/19 180 lb 6 oz (81.8 kg)  09/11/19 179 lb 14.4 oz (81.6 kg)  08/09/19 180 lb 4 oz (81.8 kg)

## 2019-12-25 ENCOUNTER — Ambulatory Visit: Payer: PPO | Attending: General Surgery

## 2019-12-25 ENCOUNTER — Other Ambulatory Visit: Payer: Self-pay

## 2019-12-25 DIAGNOSIS — Z483 Aftercare following surgery for neoplasm: Secondary | ICD-10-CM

## 2019-12-25 NOTE — Therapy (Signed)
[place], [place], [address] Phone: [phone]   Fax:  [phone]  Physical Therapy Treatment  Patient Details  Name: Kelly Beltran MRN: [id number] Date of Birth: 1955-03-04 Referring Provider (PT): Dr. Stark Klein   Encounter Date: 12/25/2019   PT End of Session - 12/25/19 0951    Visit Number 2   # unchanged due to screen only   Number of Visits 2    Date for PT Re-Evaluation 07/19/19    PT Start Time 0945    PT Stop Time 0952    PT Time Calculation (min) 7 min    Activity Tolerance Patient tolerated treatment well    Behavior During Therapy [facility] for tasks assessed/performed           Past Medical History:  Diagnosis Date  . 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 Arthritis    osteo arthritis  . Breast CA ([place])    Right Breast  Cancer  . Cancer ([place])   . Diabetes mellitus    TYPE 2  . Elevated cholesterol   . Family history of bladder cancer   . Family history of breast cancer   . Family history of colon cancer   . Family history of leukemia   . Family history of prostate cancer   . Fibroid   . GERD (gastroesophageal reflux disease)   . History of kidney stones    06/12/2019  Passed one 3monthago in her 20's had7 , passed all but 1  . Hypertension   . PONV (postoperative nausea and vomiting)    woke during lithrosplasty, Epidural with child birth - "itching"  . Uterine cancer ([facility]     Past Surgical History:  Procedure Laterality Date  . ABDOMINAL HYSTERECTOMY  1997   REPAIR OF CYSTOTOMY  . BREAST LUMPECTOMY WITH RADIOACTIVE SEED AND SENTINEL LYMPH NODE BIOPSY Right 06/15/2019   Procedure: RIGHT BREAST LUMPECTOMY WITH RADIOACTIVE SEED AND SENTINEL LYMPH NODE BIOPSY;  Surgeon: BStark Klein MD;  [place]  Service: General;  Laterality: Right;  COMBINED WITH REGIONAL FOR POST OP PAIN  . CERVIX LESION DESTRUCTION    . CHOLECYSTECTOMY    . COLPOSCOPY    .  LITHOTRIPSY      There were no vitals filed for this visit.   Subjective Assessment - 12/25/19 0944    Subjective Pt returns for 3 month L-Dex screen.    Pertinent History Patient was diagnosed on 04/27/2019 with right grade II invasive ductal carcinoma breast cancer. It is ER positive, PR negative, and HER2 negative with a Ki67 of 10%. Patient underwent a right lumpectomy and sentinel node biopsy (5 negative nodes) on 06/15/2019.                  L-DEX FLOWSHEETS - 12/25/19 0900      L-DEX LYMPHEDEMA SCREENING   BASELINE SCORE (UNILATERAL) 0.2    L-DEX SCORE (UNILATERAL) 1.8    VALUE CHANGE (UNILAT) 1.6                                  PT Long Term Goals - 07/13/19 0932      PT LONG TERM GOAL #1   Title Patient will demonstrate she has regained full shoulder ROM and function post operatively compared to baselines.    Time 8    Period Weeks  Status Partially Met                 Plan - 12/25/19 0952    Clinical Impression Statement Pt returns for 3 month L-Dex screen. Her change from baseline of 1.6 is WNLs so no further treatment needed at this time except to cont every 3 month screens from [facility], then every 6 months after that.    PT Next Visit Plan Cont 3 month L-Dex screens for 2 years-up to ~06/14/2021, then every 6 months after that.    Consulted and Agree with Plan of Care Patient           Patient will benefit from skilled therapeutic intervention in order to improve the following deficits and impairments:     Visit Diagnosis: Aftercare following surgery for neoplasm     Problem List Patient Active Problem List   Diagnosis Date Noted  . Family history of breast cancer   . Family history of bladder cancer   . Family history of colon cancer   . Family history of prostate cancer   . Family history of leukemia   . Malignant neoplasm of upper-outer quadrant of right breast in female, estrogen receptor positive ([place])  05/18/2019  . Menopausal state 03/30/2012  . 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 Elevated cholesterol   . Kidney stone     Otelia Limes, PTA 12/25/2019, 10:00 AM  [place], [place], [address] Phone: [phone]   Fax:  [phone]  Name: Kelly Beltran MRN: [id number] Date of Birth: 07-01-1955

## 2020-01-01 DIAGNOSIS — C50411 Malignant neoplasm of upper-outer quadrant of right female breast: Secondary | ICD-10-CM | POA: Diagnosis not present

## 2020-01-01 DIAGNOSIS — Z170 Estrogen receptor positive status [ER+]: Secondary | ICD-10-CM | POA: Diagnosis not present

## 2020-01-04 ENCOUNTER — Telehealth: Payer: Self-pay | Admitting: Adult Health

## 2020-01-04 NOTE — Telephone Encounter (Signed)
Rescheduled appt on 12/9. LC is out of the office. Pt confirmed appt cancellation and new appt date and time.

## 2020-01-11 ENCOUNTER — Inpatient Hospital Stay: Payer: PPO | Admitting: Adult Health

## 2020-01-23 DIAGNOSIS — R911 Solitary pulmonary nodule: Secondary | ICD-10-CM | POA: Diagnosis not present

## 2020-01-23 DIAGNOSIS — E1165 Type 2 diabetes mellitus with hyperglycemia: Secondary | ICD-10-CM | POA: Diagnosis not present

## 2020-01-23 DIAGNOSIS — Z1331 Encounter for screening for depression: Secondary | ICD-10-CM | POA: Diagnosis not present

## 2020-01-23 DIAGNOSIS — I10 Essential (primary) hypertension: Secondary | ICD-10-CM | POA: Diagnosis not present

## 2020-01-23 DIAGNOSIS — Z9181 History of falling: Secondary | ICD-10-CM | POA: Diagnosis not present

## 2020-01-23 DIAGNOSIS — Z6831 Body mass index (BMI) 31.0-31.9, adult: Secondary | ICD-10-CM | POA: Diagnosis not present

## 2020-01-23 DIAGNOSIS — Z79899 Other long term (current) drug therapy: Secondary | ICD-10-CM | POA: Diagnosis not present

## 2020-01-23 DIAGNOSIS — G720 Drug-induced myopathy: Secondary | ICD-10-CM | POA: Diagnosis not present

## 2020-01-23 DIAGNOSIS — T466X5A Adverse effect of antihyperlipidemic and antiarteriosclerotic drugs, initial encounter: Secondary | ICD-10-CM | POA: Diagnosis not present

## 2020-01-23 DIAGNOSIS — E782 Mixed hyperlipidemia: Secondary | ICD-10-CM | POA: Diagnosis not present

## 2020-01-24 ENCOUNTER — Other Ambulatory Visit: Payer: Self-pay | Admitting: Family Medicine

## 2020-01-24 DIAGNOSIS — E782 Mixed hyperlipidemia: Secondary | ICD-10-CM | POA: Diagnosis not present

## 2020-01-24 DIAGNOSIS — E1165 Type 2 diabetes mellitus with hyperglycemia: Secondary | ICD-10-CM | POA: Diagnosis not present

## 2020-01-24 DIAGNOSIS — R911 Solitary pulmonary nodule: Secondary | ICD-10-CM

## 2020-01-24 DIAGNOSIS — Z79899 Other long term (current) drug therapy: Secondary | ICD-10-CM | POA: Diagnosis not present

## 2020-01-30 ENCOUNTER — Encounter: Payer: Self-pay | Admitting: Adult Health

## 2020-01-30 ENCOUNTER — Other Ambulatory Visit: Payer: Self-pay

## 2020-01-30 ENCOUNTER — Inpatient Hospital Stay: Payer: PPO | Attending: Adult Health | Admitting: Adult Health

## 2020-01-30 VITALS — BP 130/82 | HR 95 | Temp 98.3°F | Resp 17 | Ht 62.0 in | Wt 176.4 lb

## 2020-01-30 DIAGNOSIS — Z79899 Other long term (current) drug therapy: Secondary | ICD-10-CM | POA: Insufficient documentation

## 2020-01-30 DIAGNOSIS — Z170 Estrogen receptor positive status [ER+]: Secondary | ICD-10-CM | POA: Diagnosis not present

## 2020-01-30 DIAGNOSIS — Z8379 Family history of other diseases of the digestive system: Secondary | ICD-10-CM | POA: Insufficient documentation

## 2020-01-30 DIAGNOSIS — C50411 Malignant neoplasm of upper-outer quadrant of right female breast: Secondary | ICD-10-CM | POA: Diagnosis not present

## 2020-01-30 DIAGNOSIS — I10 Essential (primary) hypertension: Secondary | ICD-10-CM | POA: Insufficient documentation

## 2020-01-30 DIAGNOSIS — E119 Type 2 diabetes mellitus without complications: Secondary | ICD-10-CM | POA: Insufficient documentation

## 2020-01-30 DIAGNOSIS — Z923 Personal history of irradiation: Secondary | ICD-10-CM | POA: Diagnosis not present

## 2020-01-30 DIAGNOSIS — Z7984 Long term (current) use of oral hypoglycemic drugs: Secondary | ICD-10-CM | POA: Diagnosis not present

## 2020-01-30 DIAGNOSIS — E7800 Pure hypercholesterolemia, unspecified: Secondary | ICD-10-CM | POA: Diagnosis not present

## 2020-01-30 DIAGNOSIS — Z8541 Personal history of malignant neoplasm of cervix uteri: Secondary | ICD-10-CM | POA: Diagnosis not present

## 2020-01-30 DIAGNOSIS — Z79811 Long term (current) use of aromatase inhibitors: Secondary | ICD-10-CM | POA: Diagnosis not present

## 2020-01-30 DIAGNOSIS — Z791 Long term (current) use of non-steroidal anti-inflammatories (NSAID): Secondary | ICD-10-CM | POA: Insufficient documentation

## 2020-01-30 DIAGNOSIS — M1990 Unspecified osteoarthritis, unspecified site: Secondary | ICD-10-CM | POA: Diagnosis not present

## 2020-01-30 NOTE — Progress Notes (Signed)
SURVIVORSHIP VISIT:    BRIEF ONCOLOGIC HISTORY:  Oncology History  Malignant neoplasm of upper-outer quadrant of right breast in female, estrogen receptor positive ([place])  05/18/2019 Initial Diagnosis   Screening mammogram detected 0.6cm mass in the right breast, 10 o'clock position. 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   05/24/2019 Cancer Staging   Staging form: Breast, AJCC 8th Edition - Clinical stage from 05/24/2019: Stage IA (cT1b, cN0, cM0, G2, ER+, PR-, HER2-)   06/15/2019 Surgery   Right lumpectomy Barry Dienes) ([id number]): IDC, grade 2, 0.8cm, with intermediate grade DCIS, clear margins, 5 right axillary lymph nodes negative.   07/05/2019 Cancer Staging   Staging form: Breast, AJCC 8th Edition - Pathologic: Stage IA (pT1b, pN0, cM0, G2, ER+, PR-, HER2-, Oncotype DX score: 24)   08/17/2019 - 09/14/2019 Radiation Therapy   The patient initially received a dose of 40.05 Gy in 15 fractions to the breast using whole-breast tangent fields. This was delivered using a 3-D conformal technique. The pt received a boost delivering an additional 12 Gy in 6 fractions using a electron boost with 49mV electrons. The total dose was 52.05 Gy.   09/2019 -  Anti-estrogen oral therapy   Anastrozole     INTERVAL HISTORY:  Ms. MKohanto review her survivorship care plan detailing her treatment course for breast cancer, as well as monitoring long-term side effects of that treatment, education regarding health maintenance, screening, and overall wellness and health promotion.     Overall, Ms. MStaileyreports feeling quite well.  She started the Anastrozole, however noted difficulty in tolerating this and stopped taking it.  She is not willing to try other anti estrogen therapies.  She is interested in learning about lifestyle modifications to reduce her future risk of breast cancer recurrence.     REVIEW OF SYSTEMS:   Review of Systems  Constitutional: Negative for appetite change, chills, fatigue, fever and unexpected weight change.  HENT:   Negative for hearing loss, lump/mass and trouble swallowing.   Eyes: Negative for eye problems and icterus.  Respiratory: Negative for chest tightness, cough and shortness of breath.   Cardiovascular: Negative for chest pain, leg swelling and palpitations.  Gastrointestinal: Negative for abdominal distention, abdominal pain, constipation, diarrhea, nausea and vomiting.  Endocrine: Negative for hot flashes.  Genitourinary: Negative for difficulty urinating.   Musculoskeletal: Negative for arthralgias.  Skin: Negative for itching and rash.  Neurological: Negative for dizziness, extremity weakness, headaches and numbness.  Hematological: Negative for adenopathy. Does not bruise/bleed easily.  Psychiatric/Behavioral: Negative for depression. The patient is not nervous/anxious.    Breast: Denies any new nodularity, masses, tenderness, nipple changes, or nipple discharge.      ONCOLOGY TREATMENT TEAM:  1. Sur[facility]  [facility] 2. Medical Oncologist: Dr. GLindi Adie 3. Radiation Oncologist: Dr. KSondra Come   PAST MEDICAL/SURGICAL HISTORY:  Past Medical History:  Diagnosis Date  . Adenocarcinoma of cervix ([place]    IN SITU  . Arthritis    osteo arthritis  . Breast CA ([place]    Right Breast  Cancer  . Cancer ([place]   . Diabetes mellitus    TYPE 2  . Elevated cholesterol   . Family history of bladder cancer   . Family history of breast cancer   . Family history of colon cancer   . Family history of leukemia   . Family history of prostate cancer   . Fibroid   .  GERD (gastroesophageal reflux disease)   . History of kidney stones    06/12/2019  Passed one 7monthago in her 20's had7 , passed all but 1  . Hypertension   . PONV (postoperative nausea and vomiting)    woke during lithrosplasty, Epidural with child birth - "itching"  .  Uterine cancer ([facility]    Past Surgical History:  Procedure Laterality Date  . ABDOMINAL HYSTERECTOMY  1997   REPAIR OF CYSTOTOMY  . BREAST LUMPECTOMY WITH RADIOACTIVE SEED AND SENTINEL LYMPH NODE BIOPSY Right 06/15/2019   Procedure: RIGHT BREAST LUMPECTOMY WITH RADIOACTIVE SEED AND SENTINEL LYMPH NODE BIOPSY;  Surgeon: BStark Klein MD;  [place]  Service: General;  Laterality: Right;  COMBINED WITH REGIONAL FOR POST OP PAIN  . CERVIX LESION DESTRUCTION    . CHOLECYSTECTOMY    . COLPOSCOPY    . LITHOTRIPSY       ALLERGIES:  Allergies  Allergen Reactions  . Statins Other (See Comments)    Muscle pain and spasms  . Lipitor [Atorvastatin Calcium] Other (See Comments)    Muscle aches/fatigue  . Mango Flavor     Redness  . Pravachol Diarrhea and Other (See Comments)    Muscle aches/fatigue  . Sulfa Antibiotics Rash    "ill feeling"  . Tetracyclines & Related Palpitations  . Tylenol [Acetaminophen] Itching and Rash     CURRENT MEDICATIONS:  Outpatient Encounter Medications as of 01/30/2020  Medication Sig  . anastrozole (ARIMIDEX) 1 MG tablet Take 1 tablet (1 mg total) by mouth daily.  . diclofenac Sodium (VOLTAREN) 1 % GEL Apply 1 g topically in the morning and at bedtime.  .Marland Kitchenesomeprazole (NEXIUM) 40 MG capsule Take 40 mg by mouth every other day.  .Marland KitchenguaiFENesin (TUSSIN) 100 MG/5ML liquid Take 200 mg by mouth 3 (three) times daily as needed for congestion.  . hydrochlorothiazide (HYDRODIURIL) 25 MG tablet Take 25 mg by mouth daily.  .Marland Kitchenibuprofen (ADVIL) 200 MG tablet Take 400 mg by mouth every 8 (eight) hours as needed (pain.).  .Marland Kitchenketotifen (ZADITOR) 0.025 % ophthalmic solution Place 1 drop into both eyes 3 (three) times daily as needed (allergy eyes.).  .Marland Kitchenlisinopril (PRINIVIL,ZESTRIL) 10 MG tablet Take 10 mg by mouth daily.   .Marland Kitchenloratadine (CLARITIN) 10 MG tablet Take 10 mg by mouth daily.   . metFORMIN (GLUCOPHAGE-XR) 500 MG 24 hr tablet Take 1,500 mg by mouth daily  with supper.   . Misc Natural Products (GLUCOSAMINE CHONDROITIN TRIPLE PO) Take 2 tablets by mouth at bedtime.  . Probiotic Product (PROBIOTIC PO) Take 1 capsule by mouth daily.  . Vitamin D3 (VITAMIN D) 25 MCG tablet Take 1,000 Units by mouth daily.  . vitamin E 180 MG (400 UNITS) capsule Take 400 Units by mouth daily.  . Zinc 50 MG TABS Take 50 mg by mouth daily.   No facility-administered encounter medications on file as of 01/30/2020.     ONCOLOGIC FAMILY HISTORY:  Family History  Problem Relation Age of Onset  . Diabetes Father   . Hypertension Father   . Prostate cancer Father 816 . Diabetes Mother   . Hypertension Mother   . Breast cancer Mother 577      2nd diagnosis at 667 . Atrial fibrillation Mother   . Colon cancer Mother 816 . Bladder Cancer Mother 548 . Stroke Paternal Grandmother   . Breast cancer Other        maternal cousin's daughter; dx. in her  20s, double mastectomy, may have had positive genetic test  . Leukemia Maternal Aunt 1     GENETIC COUNSELING/TESTING: Not at this time  SOCIAL HISTORY:  Social History   Socioeconomic History  . Marital status: Married    Spouse name: Not on file  . Number of children: Not on file  . Years of education: Not on file  . Highest education level: Not on file  Occupational History  . Not on file  Tobacco Use  . Smoking status: Never Smoker  . Smokeless tobacco: Never Used  Vaping Use  . Vaping Use: Never used  Substance and Sexual Activity  . Alcohol use: Yes    Alcohol/week: 0.0 standard drinks    Comment: rare  . Drug use: No  . Sexual activity: Yes    Partners: Male    Birth control/protection: Surgical    Comment: 1st intercourse- 64, partners- 59, married- 26 yrs   Other Topics Concern  . Not on file  Social History Narrative  . Not on file   Social Determinants of Health   Financial Resource Strain: Not on file  Food Insecurity: Not on file  Transportation Needs: Not on file  Physical  Activity: Not on file  Stress: Not on file  Social Connections: Not on file  Intimate Partner Violence: Not on file     OBSERVATIONS/OBJECTIVE:  BP 130/82 (BP Location: Left Arm, Patient Position: Sitting)   Pulse 95   Temp 98.3 F (36.8 C) (Tympanic)   Resp 17   Ht _0  (1.575 m)   Wt 176 lb 6.4 oz (80 kg)   SpO2 97%   BMI 32.26 kg/m  GENERAL: Patient is a well appearing female in no acute distress HEENT:  Sclerae anicteric.  Oropharynx clear and moist. No ulcerations or evidence of oropharyngeal candidiasis. Neck is supple.  NODES:  No cervical, supraclavicular, or axillary lymphadenopathy palpated.  BREAST EXAM:  Right breast s/p lumpectomy, no sign of local recurrence, left breast benign LUNGS:  Clear to auscultation bilaterally.  No wheezes or rhonchi. HEART:  Regular rate and rhythm. No murmur appreciated. ABDOMEN:  Soft, nontender.  Positive, normoactive bowel sounds. No organomegaly palpated. MSK:  No focal spinal tenderness to palpation. Full range of motion bilaterally in the upper extremities. EXTREMITIES:  No peripheral edema.   SKIN:  Clear with no obvious rashes or skin changes. No nail dyscrasia. NEURO:  Nonfocal. Well oriented.  Appropriate affect.    LABORATORY DATA:  None for this visit.  DIAGNOSTIC IMAGING:  None for this visit.      ASSESSMENT AND PLAN:  Ms.. Beltran is a pleasant 64 y.o. female with Stage IA right breast invasive ductal carcinoma, ER+/PR-/HER2-, diagnosed in 05/18/2019, treated with lumpectomy, adjuvant radiation therapy, and anti-estrogen therapy with Anastrozole beginning in 09/2019.  She presents to the Survivorship Clinic for our initial meeting and routine follow-up post-completion of treatment for breast cancer.    1. Stage IA right breast cancer:  Ms. Haynie is continuing to recover from definitive treatment for breast cancer. She will follow-up with her medical oncologist, Dr. Lindi Adie in 6 months with history and physical exam  per surveillance protocol.  She has opted not to take antiestrogen therapy with Anastrozole or any other form of this medication due to side effects that she had with anastrozole.  She understand that it increases her risk for recurrence.  We discussed lifestyle modifications to reduce her risk including healthy diet, and increasing her exercise.  Her mammogram is due 04/2020; orders placed today. Today, a comprehensive survivorship care plan and treatment summary was reviewed with the patient today detailing her breast cancer diagnosis, treatment course, potential late/long-term effects of treatment, appropriate follow-up care with recommendations for the future, and patient education resources.  A copy of this summary, along with a letter will be sent to the patient's primary care provider via mail/fax/In Basket message after today's visit.    2. Bone health:   She was given education on specific activities to promote bone health.  3. Cancer screening:  Due to Ms. Hreha's history and her age, she should receive screening for skin cancers, colon cancer, and gynecologic cancers.  The information and recommendations are listed on the patient's comprehensive care plan/treatment summary and were reviewed in detail with the patient.    4. Health maintenance and wellness promotion: Ms. Lisowski was encouraged to consume 5-7 servings of fruits and vegetables per day. We reviewed the "Nutrition Rainbow" handout, as well as the handout "Take Control of Your Health and Reduce Your Cancer Risk" from the [employer].  She was also encouraged to engage in moderate to vigorous exercise for 30 minutes per day most days of the week. We discussed the [employer] fitness program, which is designed for cancer survivors to help them become more physically fit after cancer treatments.  She was instructed to limit her alcohol consumption and continue to abstain from tobacco use.     5. Support  services/counseling: It is not uncommon for this period of the patient's cancer care trajectory to be one of many emotions and stressors.  We discussed how this can be increasingly difficult during the times of quarantine and social distancing due to the COVID-19 pandemic.   She was given information regarding our available services and encouraged to contact me with any questions or for help enrolling in any of our support group/programs.    Follow up instructions:    -Return to cancer center in 6 months for f/u with Dr. Lindi Adie  -Mammogram due in 04/2020 -Follow up with surgery 01/2021 -She is welcome to return back to the Survivorship Clinic at any time; no additional follow-up needed at this time.  -Consider referral back to survivorship as a long-term survivor for continued surveillance  The patient was provided an opportunity to ask questions and all were answered. The patient agreed with the plan and demonstrated an understanding of the instructions.   Total encounter time: 30 minutes*  Wilber Bihari, NP 01/30/20 3:55 PM Medical Oncology and Hematology [facility] [place], [place] [address] Tel. [phone]    Fax. [phone]

## 2020-02-13 DIAGNOSIS — E1165 Type 2 diabetes mellitus with hyperglycemia: Secondary | ICD-10-CM | POA: Diagnosis not present

## 2020-02-13 DIAGNOSIS — Z20822 Contact with and (suspected) exposure to covid-19: Secondary | ICD-10-CM | POA: Diagnosis not present

## 2020-02-15 ENCOUNTER — Inpatient Hospital Stay: Admission: RE | Admit: 2020-02-15 | Payer: PPO | Source: Ambulatory Visit

## 2020-02-16 ENCOUNTER — Ambulatory Visit: Payer: PPO | Admitting: Registered"

## 2020-02-28 DIAGNOSIS — R000 Tachycardia, unspecified: Secondary | ICD-10-CM | POA: Diagnosis not present

## 2020-02-28 DIAGNOSIS — R911 Solitary pulmonary nodule: Secondary | ICD-10-CM | POA: Diagnosis not present

## 2020-02-28 DIAGNOSIS — Z6829 Body mass index (BMI) 29.0-29.9, adult: Secondary | ICD-10-CM | POA: Diagnosis not present

## 2020-02-28 DIAGNOSIS — U071 COVID-19: Secondary | ICD-10-CM | POA: Diagnosis not present

## 2020-02-29 ENCOUNTER — Other Ambulatory Visit: Payer: PPO

## 2020-04-01 ENCOUNTER — Ambulatory Visit: Payer: PPO | Attending: General Surgery

## 2020-04-01 ENCOUNTER — Other Ambulatory Visit: Payer: Self-pay

## 2020-04-01 DIAGNOSIS — Z483 Aftercare following surgery for neoplasm: Secondary | ICD-10-CM | POA: Insufficient documentation

## 2020-04-01 NOTE — Therapy (Signed)
[facility] [address] [place], [place], [address] Phone: [phone]   Fax:  [phone]  Physical Therapy Treatment  Patient Details  Name: Kelly Beltran MRN: [id number] Date of Birth: 04/24/1955 Referring Provider (PT): Dr. Faera Byerly   Encounter Date: 04/01/2020   PT End of Session - 04/01/20 0926    Visit Number 2   # unchanged due to screen only   Number of Visits 2    Date for PT Re-Evaluation 07/19/19    PT Start Time 0921    PT Stop Time 0928    PT Time Calculation (min) 7 min    Activity Tolerance Patient tolerated treatment well    Behavior During Therapy WFL for tasks assessed/performed           Past Medical History:  Diagnosis Date  . Adenocarcinoma of cervix (HCC)    IN SITU  . Arthritis    osteo arthritis  . Breast CA (HCC)    Right Breast  Cancer  . Cancer (HCC)   . Diabetes mellitus    TYPE 2  . Elevated cholesterol   . Family history of bladder cancer   . Family history of breast cancer   . Family history of colon cancer   . Family history of leukemia   . Family history of prostate cancer   . Fibroid   . GERD (gastroesophageal reflux disease)   . History of kidney stones    06/12/2019  Passed one 1month ago in her 20's had7 , passed all but 1  . Hypertension   . PONV (postoperative nausea and vomiting)    woke during lithrosplasty, Epidural with child birth - "itching"  . Uterine cancer (HCC)     Past Surgical History:  Procedure Laterality Date  . ABDOMINAL HYSTERECTOMY  1997   REPAIR OF CYSTOTOMY  . BREAST LUMPECTOMY WITH RADIOACTIVE SEED AND SENTINEL LYMPH NODE BIOPSY Right 06/15/2019   Procedure: RIGHT BREAST LUMPECTOMY WITH RADIOACTIVE SEED AND SENTINEL LYMPH NODE BIOPSY;  Surgeon: Byerly, Faera, MD;  Location: [facility] OR;  Service: General;  Laterality: Right;  COMBINED WITH REGIONAL FOR POST OP PAIN  . CERVIX LESION DESTRUCTION    . CHOLECYSTECTOMY    . COLPOSCOPY    .  LITHOTRIPSY      There were no vitals filed for this visit.   Subjective Assessment - 04/01/20 0922    Subjective Pt returns for 3 month L-Dex screen.    Pertinent History Patient was diagnosed on 04/27/2019 with right grade II invasive ductal carcinoma breast cancer. It is ER positive, PR negative, and HER2 negative with a Ki67 of 10%. Patient underwent a right lumpectomy and sentinel node biopsy (5 negative nodes) on 06/15/2019.                  L-DEX FLOWSHEETS - 04/01/20 0900      L-DEX LYMPHEDEMA SCREENING   Measurement Type Unilateral    L-DEX MEASUREMENT EXTREMITY Upper Extremity    POSITION  Standing    DOMINANT SIDE Right    At Risk Side Right    BASELINE SCORE (UNILATERAL) 0.2    L-DEX SCORE (UNILATERAL) 1.6    VALUE CHANGE (UNILAT) 1.4                                  PT Long Term Goals - 07/13/19 0932      PT LONG TERM GOAL #  1   Title Patient will demonstrate she has regained full shoulder ROM and function post operatively compared to baselines.    Time 8    Period Weeks    Status Partially Met                 Plan - 04/01/20 2458    Clinical Impression Statement Pt returns for her 3 month L-dex screen. Her change from baseline of 1.4 is WNLs so no further treatment required at this time except to cont every 3 month L-dex screens which pt is agreeable to.    PT Next Visit Plan Cont 3 month L-Dex screens for 2 years-up to ~06/14/2021, then every 6 months after that.    Consulted and Agree with Plan of Care Patient           Patient will benefit from skilled therapeutic intervention in order to improve the following deficits and impairments:     Visit Diagnosis: Aftercare following surgery for neoplasm     Problem List Patient Active Problem List   Diagnosis Date Noted  . Family history of breast cancer   . Family history of bladder cancer   . Family history of colon cancer   . Family history of prostate cancer    . Family history of leukemia   . Malignant neoplasm of upper-outer quadrant of right breast in female, estrogen receptor positive ([place]) 05/18/2019  . Menopausal state 03/30/2012  . 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 Elevated cholesterol   . Kidney stone     Kelly Beltran, PTA 04/01/2020, 9:30 AM  [place], [place], [address] Phone: [phone]   Fax:  [phone]  Name: Kelly Beltran MRN: [id number] Date of Birth: Sep 03, 1955

## 2020-04-12 ENCOUNTER — Ambulatory Visit
Admission: RE | Admit: 2020-04-12 | Discharge: 2020-04-12 | Disposition: A | Discharge status: Home / Self Care | Payer: PPO | Source: Ambulatory Visit | Attending: Family Medicine | Admitting: Family Medicine

## 2020-04-12 ENCOUNTER — Other Ambulatory Visit: Payer: Self-pay

## 2020-04-12 DIAGNOSIS — R911 Solitary pulmonary nodule: Secondary | ICD-10-CM

## 2020-04-12 DIAGNOSIS — Z8542 Personal history of malignant neoplasm of other parts of uterus: Secondary | ICD-10-CM | POA: Diagnosis not present

## 2020-04-12 DIAGNOSIS — C50911 Malignant neoplasm of unspecified site of right female breast: Secondary | ICD-10-CM | POA: Diagnosis not present

## 2020-04-12 DIAGNOSIS — J9811 Atelectasis: Secondary | ICD-10-CM | POA: Diagnosis not present

## 2020-04-12 DIAGNOSIS — I2510 Atherosclerotic heart disease of native coronary artery without angina pectoris: Secondary | ICD-10-CM | POA: Diagnosis not present

## 2020-04-18 ENCOUNTER — Encounter: Payer: Self-pay | Admitting: Dietician

## 2020-04-18 ENCOUNTER — Other Ambulatory Visit: Payer: Self-pay

## 2020-04-18 ENCOUNTER — Encounter: Payer: PPO | Attending: Family Medicine | Admitting: Dietician

## 2020-04-18 VITALS — Wt 171.7 lb

## 2020-04-18 DIAGNOSIS — E119 Type 2 diabetes mellitus without complications: Secondary | ICD-10-CM

## 2020-04-18 NOTE — Patient Instructions (Addendum)
Check your blood sugar fasting each morning, and two hours after you begin eating a meal. This will help you to see how certain foods will affect your blood sugar.  Weigh yourself only once a week and aim for only 1-2 pounds of weight loss per week.  Consider getting back on your stationary bike and doing low intensity pedaling as tolerated.  Work towards eating three meals a day, about 5-6 hours apart!  Begin to recognize carbohydrates in your food choices!  Have 3 carb choices at each meal (45 g).   Begin to build your meals using the proportions of the Balanced Plate. . First, select your carb choice(s) for the meal, and determine how much you should have to equal 3 carb choices (45 g). . Next, select your source of protein to pair with your carb choice(s). . Finally, complete the remaining half of your meal with a variety of non-starchy vegetables.

## 2020-04-18 NOTE — Progress Notes (Signed)
Diabetes Self-Management Education  Visit Type: First/Initial  Appt. Start Time: 1445 Appt. End Time: 9147  04/18/2020  Kelly Beltran, identified by name and date of birth, is a 65 y.o. female with a diagnosis of Diabetes: Type 2.   ASSESSMENT  Weight 171 lb 11.2 oz (77.9 kg). Body mass index is 31.4 kg/m.   Diabetes Self-Management Education - 04/18/20 1510      Visit Information   Visit Type First/Initial      Initial Visit   Diabetes Type Type 2    Are you currently following a meal plan? No    Are you taking your medications as prescribed? No   Pt is prescribed metformin, but is switching to [place]   How would you rate your overall health? Fair   Due to elevated triglycerides, osteoarthritis, diabetes, and cardiovascular concerns     Psychosocial Assessment   Patient Belief/Attitude about Diabetes Motivated to manage diabetes    Self-management support Doctor's office;Family    Other persons present Patient    Patient Concerns Nutrition/Meal planning    Special Needs None    Preferred Learning Style No preference indicated    Learning Readiness Ready    How often do you need to have someone help you when you read instructions, pamphlets, or other written materials from your doctor or pharmacy? 1 - Never    What is the last grade level you completed in school? 4 years of college      Pre-Education Assessment   Patient understands the diabetes disease and treatment process. Needs Instruction    Patient understands incorporating nutritional management into lifestyle. Needs Instruction    Patient undertands incorporating physical activity into lifestyle. Needs Instruction    Patient understands using medications safely. Needs Instruction    Patient understands monitoring blood glucose, interpreting and using results Needs Instruction    Patient understands prevention, detection, and treatment of acute complications. Needs Instruction    Patient  understands prevention, detection, and treatment of chronic complications. Needs Instruction    Patient understands how to develop strategies to address psychosocial issues. Needs Instruction    Patient understands how to develop strategies to promote health/change behavior. Needs Instruction      Complications   Last HgB A1C per patient/outside source 8 %   01/23/2020   How often do you check your blood sugar? 1-2 times/day    Fasting Blood glucose range (mg/dL) 130-179    Number of hypoglycemic episodes per month 0    Number of hyperglycemic episodes per week 0    Have you had a dilated eye exam in the past 12 months? Yes    Have you had a dental exam in the past 12 months? Yes    Are you checking your feet? Yes    How many days per week are you checking your feet? 7      Dietary Intake   Breakfast 1/2 bagel with cream cheese, coffee w/stevia and cream    Snack (morning) none    Lunch open faced sloppy joe, with cheddar cheese and one bun, 4 ruffles chips    Snack (afternoon) none    Dinner Bowl of sausage, pasta shells, mixed vegetables soup, 2 pieces of italina bread    Snack (evening) 2 pieces of bread with peanut butter    Beverage(s) coffee, water      Exercise   Exercise Type ADL's;Light (walking / raking leaves)    How many days  per week to you exercise? 2    How many minutes per day do you exercise? 15    Total minutes per week of exercise 30      Patient Education   Previous Diabetes Education No    Disease state  Definition of diabetes, type 1 and 2, and the diagnosis of diabetes;Factors that contribute to the development of diabetes    Nutrition management  Carbohydrate counting;Food label reading, portion sizes and measuring food.;Role of diet in the treatment of diabetes and the relationship between the three main macronutrients and blood glucose level    Physical activity and exercise  Role of exercise on diabetes management, blood pressure control and cardiac  health.    Medications Reviewed patients medication for diabetes, action, purpose, timing of dose and side effects.    Monitoring Purpose and frequency of SMBG.;Identified appropriate SMBG and/or A1C goals.    Psychosocial adjustment Role of stress on diabetes;Identified and addressed patients feelings and concerns about diabetes    Personal strategies to promote health Lifestyle issues that need to be addressed for better diabetes care      Individualized Goals (developed by patient)   Nutrition Follow meal plan discussed    Physical Activity Exercise 1-2 times per week    Medications take my medication as prescribed    Monitoring  test my blood glucose as discussed    Reducing Risk increase portions of healthy fats      Post-Education Assessment   Patient understands the diabetes disease and treatment process. Needs Review    Patient understands incorporating nutritional management into lifestyle. Needs Review    Patient undertands incorporating physical activity into lifestyle. Needs Review    Patient understands using medications safely. Needs Review    Patient understands monitoring blood glucose, interpreting and using results Needs Review    Patient understands prevention, detection, and treatment of acute complications. Needs Review    Patient understands prevention, detection, and treatment of chronic complications. Needs Review    Patient understands how to develop strategies to address psychosocial issues. Needs Review    Patient understands how to develop strategies to promote health/change behavior. Needs Review      Outcomes   Expected Outcomes Demonstrated interest in learning. Expect positive outcomes    Future DMSE 4-6 wks    Program Status Not Completed           Individualized Plan for Diabetes Self-Management Training:   Learning Objective:  Patient will have a greater understanding of diabetes self-management. Patient education plan is to attend individual  and/or group sessions per assessed needs and concerns.   Plan:   Patient Instructions  Check your blood sugar fasting each morning, and two hours after you begin eating a meal. This will help you to see how certain foods will affect your blood sugar.  Weigh yourself only once a week and aim for only 1-2 pounds of weight loss per week.  Consider getting back on your stationary bike and doing low intensity pedaling as tolerated.  Work towards eating three meals a day, about 5-6 hours apart!  Begin to recognize carbohydrates in your food choices!  Have 3 carb choices at each meal (45 g).   Begin to build your meals using the proportions of the Balanced Plate. . First, select your carb choice(s) for the meal, and determine how much you should have to equal 3 carb choices (45 g). . Next, select your source of protein to pair with your carb choice(s). Marland Kitchen  Finally, complete the remaining half of your meal with a variety of non-starchy vegetables.    Expected Outcomes:  Demonstrated interest in learning. Expect positive outcomes  Education material provided: Meal plan card, My Plate and Snack sheet  If problems or questions, patient to contact team via:  Phone and Email  Future DSME appointment: 4-6 wks

## 2020-04-26 DIAGNOSIS — I10 Essential (primary) hypertension: Secondary | ICD-10-CM | POA: Diagnosis not present

## 2020-04-26 DIAGNOSIS — E1165 Type 2 diabetes mellitus with hyperglycemia: Secondary | ICD-10-CM | POA: Diagnosis not present

## 2020-04-26 DIAGNOSIS — E782 Mixed hyperlipidemia: Secondary | ICD-10-CM | POA: Diagnosis not present

## 2020-04-26 DIAGNOSIS — Z6830 Body mass index (BMI) 30.0-30.9, adult: Secondary | ICD-10-CM | POA: Diagnosis not present

## 2020-04-26 DIAGNOSIS — Z79899 Other long term (current) drug therapy: Secondary | ICD-10-CM | POA: Diagnosis not present

## 2020-04-26 DIAGNOSIS — K219 Gastro-esophageal reflux disease without esophagitis: Secondary | ICD-10-CM | POA: Diagnosis not present

## 2020-04-26 DIAGNOSIS — I700 Atherosclerosis of aorta: Secondary | ICD-10-CM | POA: Diagnosis not present

## 2020-05-02 DIAGNOSIS — Z08 Encounter for follow-up examination after completed treatment for malignant neoplasm: Secondary | ICD-10-CM | POA: Diagnosis not present

## 2020-05-06 ENCOUNTER — Telehealth: Payer: Self-pay

## 2020-05-06 NOTE — Telephone Encounter (Signed)
Faxed Records to [employer]

## 2020-05-18 NOTE — Progress Notes (Deleted)
Cardiology Office Note:    Date:  05/18/2020   ID:  Kelly Beltran, DOB Apr 23, 1955, MRN [id number]  PCP:  [place], Nathan, [place]  Cardiologist:  No primary care provider on file.  Advanced Practice Provider:  No care team member to display Electrophysiologist:  None    Referring MD: Leonides Sake, MD     History of Present Illness:    Kelly Beltran is a 65 y.o. female with a hx of right breast cancer, DMII, HLD, and HTN who was referred by Dr. Lisbeth Ply for further evaluation of coronary calcification found on CT chest.   Past Medical History:  Diagnosis Date  . Adenocarcinoma of cervix ([place])    IN SITU  . Arthritis    osteo arthritis  . Breast CA ([place])    Right Breast  Cancer  . Cancer ([place])   . Diabetes mellitus    TYPE 2  . Elevated cholesterol   . Family history of bladder cancer   . Family history of breast cancer   . Family history of colon cancer   . Family history of leukemia   . Family history of prostate cancer   . Fibroid   . GERD (gastroesophageal reflux disease)   . History of kidney stones    06/12/2019  Passed one 19month ago in her 20's had7 , passed all but 1  . Hypertension   . PONV (postoperative nausea and vomiting)    woke during lithrosplasty, Epidural with child birth - "itching"  . Uterine cancer [facility])     Past Surgical History:  Procedure Laterality Date  . ABDOMINAL HYSTERECTOMY  1997   REPAIR OF CYSTOTOMY  . BREAST LUMPECTOMY WITH RADIOACTIVE SEED AND SENTINEL LYMPH NODE BIOPSY Right 06/15/2019   Procedure: RIGHT BREAST LUMPECTOMY WITH RADIOACTIVE SEED AND SENTINEL LYMPH NODE BIOPSY;  Surgeon: Stark Klein, MD;  Location: [facility];  Service: General;  Laterality: Right;  COMBINED WITH REGIONAL FOR POST OP PAIN  . CERVIX LESION DESTRUCTION    . CHOLECYSTECTOMY    . COLPOSCOPY    . LITHOTRIPSY      Current Medications: No outpatient medications have been marked as taking for the 05/24/20  encounter (Appointment) with Freada Bergeron, MD.     Allergies:   Statins, Lipitor [atorvastatin calcium], Mango flavor, Pravachol, Sulfa antibiotics, Tetracyclines & related, and Tylenol [acetaminophen]   Social History   Socioeconomic History  . Marital status: Married    Spouse name: Not on file  . Number of children: Not on file  . Years of education: Not on file  . Highest education level: Not on file  Occupational History  . Not on file  Tobacco Use  . Smoking status: Never Smoker  . Smokeless tobacco: Never Used  Vaping Use  . Vaping Use: Never used  Substance and Sexual Activity  . Alcohol use: Yes    Alcohol/week: 0.0 standard drinks    Comment: rare  . Drug use: No  . Sexual activity: Yes    Partners: Male    Birth control/protection: Surgical    Comment: 1st intercourse- 13, partners- 70, married- 31 yrs   Other Topics Concern  . Not on file  Social History Narrative  . Not on file   Social Determinants of Health   Financial Resource Strain: Not on file  Food Insecurity: Not on file  Transportation Needs: Not on file  Physical Activity: Not on file  Stress: Not on file  Social Connections: Not on file     Family History: The patient's ***family history includes Atrial fibrillation in her mother; Bladder Cancer (age of onset: 66) in her mother; Breast cancer in an other family member; Breast cancer (age of onset: 8) in her mother; Colon cancer (age of onset: 10) in her mother; Diabetes in her father and mother; Hypertension in her father and mother; Leukemia (age of onset: 38) in her maternal aunt; Prostate cancer (age of onset: 31) in her father; Stroke in her paternal grandmother.  ROS:   Please see the history of present illness.    *** All other systems reviewed and are negative.  EKGs/Labs/Other Studies Reviewed:    The following studies were reviewed today: CT chest 04/12/20: FINDINGS: Cardiovascular: The heart size is unremarkable. There is  no significant pericardial effusion. There are coronary artery calcifications. There are mild atherosclerotic changes of the thoracic aorta. There is no aneurysm of the thoracic aorta.  Mediastinum/Nodes:  -- No mediastinal lymphadenopathy.  -- No hilar lymphadenopathy.  -- No axillary lymphadenopathy.  -- No supraclavicular lymphadenopathy.  -- Normal thyroid gland where visualized.  -  Unremarkable esophagus.  Lungs/Pleura: There is a mosaic appearance of the lung parenchyma bilaterally. There is atelectasis at the lung bases. There is no pneumothorax. No large pleural effusion. There is an 8 mm pulmonary nodule in the lingula (axial series 5, image 81). There is a small 4 mm pulmonary nodule in the left upper lobe (axial series 5, image 64). The trachea is unremarkable.  Upper Abdomen: There is hepatic steatosis. There is an indeterminate lesion in the left hepatic lobe measuring approximately 3 cm (axial series 2, image 134).  Musculoskeletal: No chest wall abnormality. No bony spinal canal stenosis. There are postsurgical changes of the right breast, likely related to prior lumpectomy.  IMPRESSION: 1. Stable 8 mm pulmonary nodule in the lingula. A 12 month follow-up is recommended to document approximately 2 years of stability. 2. There is a mosaic appearance of the lung parenchyma bilaterally. This is nonspecific but can be seen in the setting of small airways disease. 3. There is an indeterminate lesion in the left hepatic lobe measuring approximately 3 cm. This is favored to represent an atypical appearance of hepatic steatosis. A contrast enhanced CT of the abdomen pelvis is recommended on a nonemergent basis. 4. Hepatic steatosis.  EKG:  EKG is *** ordered today.  The ekg ordered today demonstrates ***  Recent Labs: 05/24/2019: ALT 12 06/12/2019: BUN 19; Creatinine, Ser 0.84; Hemoglobin 14.1; Platelets 285; Potassium 3.5; Sodium 139  Recent Lipid  Panel No results found for: CHOL, TRIG, HDL, CHOLHDL, VLDL, LDLCALC, LDLDIRECT   Risk Assessment/Calculations:   {Does this patient have ATRIAL FIBRILLATION?:[id number]}   Physical Exam:    VS:  There were no vitals taken for this visit.    Wt Readings from Last 3 Encounters:  04/18/20 171 lb 11.2 oz (77.9 kg)  01/30/20 176 lb 6.4 oz (80 kg)  10/23/19 180 lb 6 oz (81.8 kg)     GEN: *** Well nourished, well developed in no acute distress HEENT: Normal NECK: No JVD; No carotid bruits LYMPHATICS: No lymphadenopathy CARDIAC: ***RRR, no murmurs, rubs, gallops RESPIRATORY:  Clear to auscultation without rales, wheezing or rhonchi  ABDOMEN: Soft, non-tender, non-distended MUSCULOSKELETAL:  No edema; No deformity  SKIN: Warm and dry NEUROLOGIC:  Alert and oriented x 3 PSYCHIATRIC:  Normal affect   ASSESSMENT:    No diagnosis found. PLAN:    In order  of problems listed above:  #CAD: #Coronary Calcification: Statin intolerant and does not want to re-trial.  -Will refer to lipid clinic for consideration for PCSK9i -Start zetia 10mg  daily  #HTN: Well controlled -Continue HCTZ 25mg  daily -Continue lisinopril 10mg  daily  #History of right sided breast cancer: Did not have chemo but received XRT to right breast. Last treatment 09/2019. -Will need surveillance TTE   #Pulmonary Nodule: CT chest with 58mm pulmonary nodule in the lingula and 23mm nodule in the LU lobe. Needs 1 year follow-up. -Needs yearly follow-up with CT imaging--followed with PCP  #Hepatic Lobe Lesion: Measuring 3cm. Likely steatosis.  -Has been followed by PCP  {Are you ordering a CV Procedure (e.g. stress test, cath, DCCV, TEE, etc)?   Press F2        :[id number]}    Medication Adjustments/Labs and Tests Ordered: Current medicines are reviewed at length with the patient today.  Concerns regarding medicines are outlined above.  No orders of the defined types were placed in this encounter.  No orders  of the defined types were placed in this encounter.   There are no Patient Instructions on file for this visit.   Signed, Freada Bergeron, MD  05/18/2020 9:26 PM    [place]

## 2020-05-24 ENCOUNTER — Encounter: Payer: Self-pay | Admitting: Cardiology

## 2020-05-24 ENCOUNTER — Other Ambulatory Visit: Payer: Self-pay

## 2020-05-24 ENCOUNTER — Ambulatory Visit: Payer: PPO | Admitting: Cardiology

## 2020-05-24 VITALS — BP 112/78 | HR 102 | Ht 62.0 in | Wt 168.2 lb

## 2020-05-24 DIAGNOSIS — Z789 Other specified health status: Secondary | ICD-10-CM | POA: Diagnosis not present

## 2020-05-24 DIAGNOSIS — Z170 Estrogen receptor positive status [ER+]: Secondary | ICD-10-CM

## 2020-05-24 DIAGNOSIS — R911 Solitary pulmonary nodule: Secondary | ICD-10-CM | POA: Diagnosis not present

## 2020-05-24 DIAGNOSIS — E7800 Pure hypercholesterolemia, unspecified: Secondary | ICD-10-CM | POA: Diagnosis not present

## 2020-05-24 DIAGNOSIS — Z79899 Other long term (current) drug therapy: Secondary | ICD-10-CM | POA: Diagnosis not present

## 2020-05-24 DIAGNOSIS — C50411 Malignant neoplasm of upper-outer quadrant of right female breast: Secondary | ICD-10-CM | POA: Diagnosis not present

## 2020-05-24 DIAGNOSIS — E785 Hyperlipidemia, unspecified: Secondary | ICD-10-CM | POA: Diagnosis not present

## 2020-05-24 DIAGNOSIS — I2510 Atherosclerotic heart disease of native coronary artery without angina pectoris: Secondary | ICD-10-CM

## 2020-05-24 MED ORDER — EZETIMIBE 10 MG PO TABS: 10.0000 mg | ORAL_TABLET | Freq: Every day | ORAL | 2 refills | Status: DC

## 2020-05-24 NOTE — Patient Instructions (Signed)
Medication Instructions:   START TAKING ZETIA 10 MG BY MOUTH DAILY  *If you need a refill on your cardiac medications before your next appointment, please call your pharmacy*   You have been referred to [facility] SEE OUR PHARMACIST FOR MED MANAGEMENT AND STATIN INTOLERANCE    Follow-Up: At [facility], you and your health needs are our priority.  As part of our continuing mission to provide you with exceptional heart care, we have created designated Provider Care Teams.  These Care Teams include your primary Cardiologist (physician) and Advanced Practice Providers (APPs -  Physician Assistants and Nurse Practitioners) who all work together to provide you with the care you need, when you need it.  We recommend signing up for the patient portal called "MyChart".  Sign up information is provided on this After Visit Summary.  MyChart is used to connect with patients for Virtual Visits (Telemedicine).  Patients are able to view lab/test results, encounter notes, upcoming appointments, etc.  Non-urgent messages can be sent to your provider as well.   To learn more about what you can do with MyChart, go to [email or web address].    Your next appointment:   6 month(s)  The format for your next appointment:   In Person  Provider:   You will see one of the following Advanced Practice Providers on your designated Care Team:    Richardson Dopp, PA-C  [place], [place]

## 2020-05-24 NOTE — Progress Notes (Signed)
Cardiology Office Note:    Date:  05/24/2020   ID:  Kelly Beltran, DOB [birth year], MRN [id number]  PCP:  Leonides Sake, MD   [place]  Cardiologist:  No primary care provider on file.  Advanced Practice Provider:  No care team member to display Electrophysiologist:  None    Referring MD: Leonides Sake, MD     History of Present Illness:    Kelly Beltran is a 65 y.o. female with a hx of right breast cancer, DMII, HLD, and HTN who was referred by Dr. Lisbeth Ply for further evaluation of coronary calcification found on CT chest.  Today, she is feeling well today. Has occasional shortness of breath, but she attributes this to being out of shape. She plans to work on this, and has already lost some weight. She states she is statin intolerant and knows her cholesterol is high, but could not stand the statins she has tried in the past. She is amenable to zetia, however. Her blood pressure at home is well controlled. Denies any chest discomfort, or SOB when walking. No syncope, orthopnea, or PND.  Of note, she has been followed for the history of pulmonary nodules by her PCP which have been stable on serial CT scans.   Also has history of breast cancer which is in remission. Received mastectomy and XRT with last treatment in 09/2019. No chemo.   Past Medical History:  Diagnosis Date  . 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 Arthritis    osteo arthritis  . Breast CA ([place])    Right Breast  Cancer  . Cancer ([place])   . Diabetes mellitus    TYPE 2  . Elevated cholesterol   . Family history of bladder cancer   . Family history of breast cancer   . Family history of colon cancer   . Family history of leukemia   . Family history of prostate cancer   . Fibroid   . GERD (gastroesophageal reflux disease)   . History of kidney stones    06/12/2019  Passed one 58month ago in her 20's had7 , passed all but 1  . Hypertension   . PONV (postoperative  nausea and vomiting)    woke during lithrosplasty, Epidural with child birth - "itching"  . Uterine cancer [facility])     Past Surgical History:  Procedure Laterality Date  . ABDOMINAL HYSTERECTOMY  1997   REPAIR OF CYSTOTOMY  . BREAST LUMPECTOMY WITH RADIOACTIVE SEED AND SENTINEL LYMPH NODE BIOPSY Right 06/15/2019   Procedure: RIGHT BREAST LUMPECTOMY WITH RADIOACTIVE SEED AND SENTINEL LYMPH NODE BIOPSY;  Surgeon: Stark Klein, MD;  Location: [facility];  Service: General;  Laterality: Right;  COMBINED WITH REGIONAL FOR POST OP PAIN  . CERVIX LESION DESTRUCTION    . CHOLECYSTECTOMY    . COLPOSCOPY    . LITHOTRIPSY      Current Medications: Current Meds  Medication Sig  . diclofenac Sodium (VOLTAREN) 1 % GEL Apply 1 g topically in the morning and at bedtime.  Marland Kitchen esomeprazole (NEXIUM) 40 MG capsule Take 40 mg by mouth every other day.  . ezetimibe (ZETIA) 10 MG tablet Take 1 tablet (10 mg total) by mouth daily.  Marland Kitchen guaiFENesin (ROBITUSSIN) 100 MG/5ML liquid Take 200 mg by mouth 3 (three) times daily as needed for congestion.  . hydrochlorothiazide (HYDRODIURIL) 25 MG tablet Take 25 mg by mouth daily.  . hydrocortisone (ANUSOL-HC) 2.5 % rectal cream Apply  topically.  Marland Kitchen ibuprofen (ADVIL) 200 MG tablet Take 400 mg by mouth every 8 (eight) hours as needed (pain.).  Marland Kitchen JANUMET 50-500 MG tablet Take 1 tablet by mouth 2 (two) times daily.  Marland Kitchen ketotifen (ZADITOR) 0.025 % ophthalmic solution Place 1 drop into both eyes 3 (three) times daily as needed (allergy eyes.).  Marland Kitchen lisinopril (PRINIVIL,ZESTRIL) 10 MG tablet Take 10 mg by mouth daily.   Marland Kitchen loratadine (CLARITIN) 10 MG tablet Take 10 mg by mouth daily.   . Probiotic Product (PROBIOTIC PO) Take 1 capsule by mouth daily.  . Vitamin D3 (VITAMIN D) 25 MCG tablet Take 1,000 Units by mouth daily.  . vitamin E 180 MG (400 UNITS) capsule Take 400 Units by mouth daily.  . Zinc 50 MG TABS Take 50 mg by mouth daily.     Allergies:   Statins, Lipitor [atorvastatin  calcium], Mango flavor, Pravachol, Sulfa antibiotics, Tetracyclines & related, and Tylenol [acetaminophen]   Social History   Socioeconomic History  . Marital status: Married    Spouse name: Not on file  . Number of children: Not on file  . Years of education: Not on file  . Highest education level: Not on file  Occupational History  . Not on file  Tobacco Use  . Smoking status: Never Smoker  . Smokeless tobacco: Never Used  Vaping Use  . Vaping Use: Never used  Substance and Sexual Activity  . Alcohol use: Yes    Alcohol/week: 0.0 standard drinks    Comment: rare  . Drug use: No  . Sexual activity: Yes    Partners: Male    Birth control/protection: Surgical    Comment: 1st intercourse- 58, partners- 70, married- 47 yrs   Other Topics Concern  . Not on file  Social History Narrative  . Not on file   Social Determinants of Health   Financial Resource Strain: Not on file  Food Insecurity: Not on file  Transportation Needs: Not on file  Physical Activity: Not on file  Stress: Not on file  Social Connections: Not on file     Family History: The patient's family history includes Atrial fibrillation in her mother; Bladder Cancer (age of onset: 1) in her mother; Breast cancer in an other family member; Breast cancer (age of onset: 30) in her mother; Colon cancer (age of onset: 22) in her mother; Diabetes in her father and mother; Hypertension in her father and mother; Leukemia (age of onset: 57) in her maternal aunt; Prostate cancer (age of onset: 36) in her father; Stroke in her paternal grandmother.  ROS:   Please see the history of present illness.    Review of Systems  Constitutional: Negative for malaise/fatigue.  HENT: Negative for hearing loss.   Eyes: Negative for blurred vision.  Respiratory: Positive for shortness of breath.   Cardiovascular: Positive for palpitations. Negative for chest pain.  Gastrointestinal: Negative for heartburn.  Genitourinary: Negative  for dysuria.  Musculoskeletal: Negative for myalgias.  Neurological: Negative for dizziness.  Psychiatric/Behavioral: The patient is not nervous/anxious.      EKGs/Labs/Other Studies Reviewed:    The following studies were reviewed today: CT chest 04/12/20: FINDINGS: Cardiovascular: The heart size is unremarkable. There is no significant pericardial effusion. There are coronary artery calcifications. There are mild atherosclerotic changes of the thoracic aorta. There is no aneurysm of the thoracic aorta.  Mediastinum/Nodes:  -- No mediastinal lymphadenopathy.  -- No hilar lymphadenopathy.  -- No axillary lymphadenopathy.  -- No supraclavicular lymphadenopathy.  --  Normal thyroid gland where visualized.  -  Unremarkable esophagus.  Lungs/Pleura: There is a mosaic appearance of the lung parenchyma bilaterally. There is atelectasis at the lung bases. There is no pneumothorax. No large pleural effusion. There is an 8 mm pulmonary nodule in the lingula (axial series 5, image 81). There is a small 4 mm pulmonary nodule in the left upper lobe (axial series 5, image 64). The trachea is unremarkable.  Upper Abdomen: There is hepatic steatosis. There is an indeterminate lesion in the left hepatic lobe measuring approximately 3 cm (axial series 2, image 134).  Musculoskeletal: No chest wall abnormality. No bony spinal canal stenosis. There are postsurgical changes of the right breast, likely related to prior lumpectomy.  IMPRESSION: 1. Stable 8 mm pulmonary nodule in the lingula. A 12 month follow-up is recommended to document approximately 2 years of stability. 2. There is a mosaic appearance of the lung parenchyma bilaterally. This is nonspecific but can be seen in the setting of small airways disease. 3. There is an indeterminate lesion in the left hepatic lobe measuring approximately 3 cm. This is favored to represent an atypical appearance of hepatic  steatosis. A contrast enhanced CT of the abdomen pelvis is recommended on a nonemergent basis. 4. Hepatic steatosis.  EKG:  05/24/20: Sinus tachycardia, Rate: 102 bpm   Recent Labs: 06/12/2019: BUN 19; Creatinine, Ser 0.84; Hemoglobin 14.1; Platelets 285; Potassium 3.5; Sodium 139  Recent Lipid Panel No results found for: CHOL, TRIG, HDL, CHOLHDL, VLDL, LDLCALC, LDLDIRECT   Risk Assessment/Calculations:       Physical Exam:    VS:  BP 112/78   Pulse (!) 102   Ht 5\' 2"  (1.575 m)   Wt 168 lb 3.2 oz (76.3 kg)   SpO2 97%   BMI 30.76 kg/m     Wt Readings from Last 3 Encounters:  05/24/20 168 lb 3.2 oz (76.3 kg)  04/18/20 171 lb 11.2 oz (77.9 kg)  01/30/20 176 lb 6.4 oz (80 kg)     GEN: Well nourished, well developed in no acute distress HEENT: Normal NECK: No JVD; No carotid bruits CARDIAC: RRR, very faint 1/6 systolic murmur at RUSB, no rubs, no gallops RESPIRATORY:  Clear to auscultation without rales, wheezing or rhonchi  ABDOMEN: Soft, non-tender, non-distended MUSCULOSKELETAL:  No edema; No deformity  SKIN: Warm and dry NEUROLOGIC:  Alert and oriented x 3 PSYCHIATRIC:  Normal affect   ASSESSMENT:    1. Coronary artery disease involving native coronary artery of native heart without angina pectoris   2. Statin intolerance   3. Medication management   4. Elevated cholesterol   5. Hyperlipidemia, unspecified hyperlipidemia type   6. Malignant neoplasm of upper-outer quadrant of right breast in female, estrogen receptor positive ([place])   7. Pulmonary nodule    PLAN:    In order of problems listed above:  #CAD: #Coronary Calcification: Asymptomatic and active. No known personal history of heart disease. Statin intolerant and does not want to re-trial. Amenable to stating zetia. Declined ASA as takes NSAIDs for joint. pain -Will refer to lipid clinic for consideration for PCSK9i -Start zetia 10mg  daily -Goal LDL <70; currently 143  #HLD: #Statin  Intolerant: LDL 143, HDL 35, TG 253. Does not want to re-trial statin. Amenable to zetia. Likely good candidate for PCSK9i if patient willing to trial. Will refer to lipid clinic. -Refer to lipid clinic as above -Start zetia as above -Likely good candidate for PCSK9i if patient amenable to try -Continue lifestyle modifications  #  HTN: Well controlled -Continue HCTZ 25mg  daily -Continue lisinopril 10mg  daily  #History of right sided breast cancer: Did not have chemo but received XRT to right breast. Last treatment 09/2019. -Will need surveillance TTE in the next couple of years  #Pulmonary Nodule: CT chest with 67mm pulmonary nodule in the lingula and 87mm nodule in the LU lobe. Needs 1 year follow-up. -Needs yearly follow-up with CT imaging--follows with PCP  #Hepatic Lobe Lesion: Measuring 3cm. Confirmed steatosis.  -Has been followed by PCP  Medication Adjustments/Labs and Tests Ordered: Current medicines are reviewed at length with the patient today.  Concerns regarding medicines are outlined above.  Orders Placed This Encounter  Procedures  . AMB Referral to [facility]   Meds ordered this encounter  Medications  . ezetimibe (ZETIA) 10 MG tablet    Sig: Take 1 tablet (10 mg total) by mouth daily.    Dispense:  90 tablet    Refill:  2    Patient Instructions  Medication Instructions:   START TAKING ZETIA 10 MG BY MOUTH DAILY  *If you need a refill on your cardiac medications before your next appointment, please call your pharmacy*   You have been referred to [facility] SEE OUR PHARMACIST FOR MED MANAGEMENT AND STATIN INTOLERANCE    Follow-Up: At [facility], you and your health needs are our priority.  As part of our continuing mission to provide you with exceptional heart care, we have created designated Provider Care Teams.  These Care Teams include your primary Cardiologist (physician) and Advanced Practice Providers (APPs -   Physician Assistants and Nurse Practitioners) who all work together to provide you with the care you need, when you need it.  We recommend signing up for the patient portal called "MyChart".  Sign up information is provided on this After Visit Summary.  MyChart is used to connect with patients for Virtual Visits (Telemedicine).  Patients are able to view lab/test results, encounter notes, upcoming appointments, etc.  Non-urgent messages can be sent to your provider as well.   To learn more about what you can do with MyChart, go to [email or web address].    Your next appointment:   6 month(s)  The format for your next appointment:   In Person  Provider:   You will see one of the following Advanced Practice Providers on your designated Care Team:    Richardson Dopp, PA-C  Vin Melbourne, PA-C         Follow-up in 6 months.  I,Mathew Stumpf,acting as a Education administrator for Freada Bergeron, MD.,have documented all relevant documentation on the behalf of Freada Bergeron, MD,as directed by  Freada Bergeron, MD while in the presence of Freada Bergeron, MD.  I, Freada Bergeron, MD, have reviewed all documentation for this visit. The documentation on 05/24/20 for the exam, diagnosis, procedures, and orders are all accurate and complete.  Signed, Freada Bergeron, MD  05/24/2020 11:19 AM    [place]

## 2020-05-27 NOTE — Addendum Note (Signed)
Addended by: Lanna Poche R on: 05/27/2020 03:54 PM   Modules accepted: Orders

## 2020-06-04 ENCOUNTER — Ambulatory Visit: Payer: PPO | Admitting: Dietician

## 2020-06-13 ENCOUNTER — Ambulatory Visit (INDEPENDENT_AMBULATORY_CARE_PROVIDER_SITE_OTHER): Payer: PPO | Admitting: Pharmacist

## 2020-06-13 ENCOUNTER — Other Ambulatory Visit: Payer: Self-pay

## 2020-06-13 DIAGNOSIS — E782 Mixed hyperlipidemia: Secondary | ICD-10-CM

## 2020-06-13 DIAGNOSIS — I2510 Atherosclerotic heart disease of native coronary artery without angina pectoris: Secondary | ICD-10-CM | POA: Diagnosis not present

## 2020-06-13 DIAGNOSIS — I2584 Coronary atherosclerosis due to calcified coronary lesion: Secondary | ICD-10-CM

## 2020-06-13 DIAGNOSIS — G720 Drug-induced myopathy: Secondary | ICD-10-CM | POA: Diagnosis not present

## 2020-06-13 DIAGNOSIS — T466X5A Adverse effect of antihyperlipidemic and antiarteriosclerotic drugs, initial encounter: Secondary | ICD-10-CM | POA: Diagnosis not present

## 2020-06-13 NOTE — Patient Instructions (Addendum)
It was nice to see you today  Your LDL is 143 and your goal is < 70 because of your coronary artery calcifications - Take Zetia 10mg  daily - Once your MD checks your labs next month, if your LDL is still above goal, please consider starting Repatha injections once every 2 weeks. They lower your LDL cholesterol by 60% and help to reduce your risk of a heart attack and stroke  Your triglycerides are 253 and your goal is < 150 - Focus on limiting carbs and sugar in your diet - Consider starting your fish oil 2 capsules twice daily - this typically lowers your triglycerides by 30-40%

## 2020-06-13 NOTE — Progress Notes (Addendum)
Patient ID: Kelly Beltran                 DOB: 1955/08/06                    MRN: [id number]     HPI: Kelly Beltran is a 65 y.o. female patient referred to lipid clinic by Dr Johney Frame. PMH is significant for DM2, HLD, HTN, right breast cancer, and coronary calcification found on chest CT. Had COVID earlier this year in January. She was seen by Dr Johney Frame on 05/24/20 and started on ezetimibe. She has a history of statin intolerance and did not want to re-trial. She was referred to lipid clinic to discuss PCSK9i therapy.  Pt presents today in good spirits. She experienced weakness all over her body on atorvastatin and pravastatin. Symptoms presented within 1 week for each statin and resolved on statin discontinuation. She does not recall dosing of either med. Reports her father tolerated statins just fine but her mother experienced similar side effects that she did. She also took Zetia in the past and experienced some diarrhea, but reported she had some at baseline. Reports GI issues have resolved now. She has not started Zetia since rx was sent in last month since she's had some social events. Plans to start it on Tuesday. Her PCP prescribed generic Lovaza for her TG, she has not started this. Reports hesitance in taking medications in general. Insurance does not cover Vascepa well ($100/month). Reports A1c was 8.3% a few months ago, having labs rechecked next month. Has started seeing a nutritionist for her DM and has made improvements in her diet. Fasting glucose has improved to 120.  Current Medications: ezetimibe 10mg  daily - hasn't started yet Intolerances: atorvastatin, pravastatin - muscle aches and fatigue Risk Factors: coronary calcifications, DM, HTN LDL goal: 70mg /dL  Diet: Sees dietician for her DM. Stopped snacking. More fruits and vegetables. Rare alcohol use. Has been cutting back on sweets.  Exercise: Stationary bike  Family History: Atrial fibrillation in her mother; Bladder  Cancer (age of onset: 35) in her mother; Breast cancer in an other family member; Breast cancer (age of onset: 73) in her mother; Colon cancer (age of onset: 29) in her mother; Diabetes in her father and mother; Hypertension in her father and mother; Leukemia (age of onset: 8) in her maternal aunt; Prostate cancer (age of onset: 84) in her father; Stroke in her paternal grandmother.  Social History: Denies tobacco use, rare alcohol use, no drug use.  Labs: 04/26/20: TC 224, TG 253, HDL 35, LDL 143 07/20/19: TC 242, TG 379, HDL 39, LDL 134  Past Medical History:  Diagnosis Date  . 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 Arthritis    osteo arthritis  . Breast CA ([place])    Right Breast  Cancer  . Cancer ([place])   . Diabetes mellitus    TYPE 2  . Elevated cholesterol   . Family history of bladder cancer   . Family history of breast cancer   . Family history of colon cancer   . Family history of leukemia   . Family history of prostate cancer   . Fibroid   . GERD (gastroesophageal reflux disease)   . History of kidney stones    06/12/2019  Passed one 55month ago in her 20's had7 , passed all but 1  . Hypertension   . PONV (postoperative nausea and vomiting)    woke during lithrosplasty,  Epidural with child birth - "itching"  . Uterine cancer [facility])     Current Outpatient Medications on File Prior to Visit  Medication Sig Dispense Refill  . diclofenac Sodium (VOLTAREN) 1 % GEL Apply 1 g topically in the morning and at bedtime.    Marland Kitchen esomeprazole (NEXIUM) 40 MG capsule Take 40 mg by mouth every other day.    . ezetimibe (ZETIA) 10 MG tablet Take 1 tablet (10 mg total) by mouth daily. 90 tablet 2  . guaiFENesin (ROBITUSSIN) 100 MG/5ML liquid Take 200 mg by mouth 3 (three) times daily as needed for congestion.    . hydrochlorothiazide (HYDRODIURIL) 25 MG tablet Take 25 mg by mouth daily.    . hydrocortisone (ANUSOL-HC) 2.5 % rectal cream Apply topically.    Marland Kitchen ibuprofen (ADVIL) 200 MG  tablet Take 400 mg by mouth every 8 (eight) hours as needed (pain.).    Marland Kitchen JANUMET 50-500 MG tablet Take 1 tablet by mouth 2 (two) times daily.    Marland Kitchen ketotifen (ZADITOR) 0.025 % ophthalmic solution Place 1 drop into both eyes 3 (three) times daily as needed (allergy eyes.).    Marland Kitchen lisinopril (PRINIVIL,ZESTRIL) 10 MG tablet Take 10 mg by mouth daily.     Marland Kitchen loratadine (CLARITIN) 10 MG tablet Take 10 mg by mouth daily.     . Probiotic Product (PROBIOTIC PO) Take 1 capsule by mouth daily.    . Vitamin D3 (VITAMIN D) 25 MCG tablet Take 1,000 Units by mouth daily.    . vitamin E 180 MG (400 UNITS) capsule Take 400 Units by mouth daily.    . Zinc 50 MG TABS Take 50 mg by mouth daily.     No current facility-administered medications on file prior to visit.    Allergies  Allergen Reactions  . Statins Other (See Comments)    Muscle pain and spasms  . Lipitor [Atorvastatin Calcium] Other (See Comments)    Muscle aches/fatigue  . Mango Flavor     Redness  . Pravachol Diarrhea and Other (See Comments)    Muscle aches/fatigue  . Sulfa Antibiotics Rash    "ill feeling"  . Tetracyclines & Related Palpitations  . Tylenol [Acetaminophen] Itching and Rash    Assessment/Plan:  1. Hyperlipidemia - LDL 143 above goal < 70 due to coronary calcifications and DM. Pt is intolerant to 2 statins, does not wish to rechallenge. Prescribed Zetia last month but hadn't started yet, plans to start next week. Sees PCP in 1 month and will have labs rechecked. Discussed Repatha today in office which would bring LDL to goal along with cardiovascular risk reduction benefit. She would like to think about it and see how her labs look next month first. Repatha prior authorization already approved through 09/11/20 if pt does start on therapy. TG also elevated at 253. PCP prescribed pt Lovaza (Vascepa is not covered well by her insurance). She has not started this yet either - plans to wait to see if lifestyle changes have improved  her TG on next lipid panel. Will call pt once PCP checks labs next month to discuss future lipid lowering therapy needed.  Yamili Lichtenwalner E. Hulet Ehrmann, PharmD, BCACP, [place] [address] [place], [place] [address] Phone: [phone]; Fax: [phone] 06/13/2020 9:03 AM

## 2020-06-17 DIAGNOSIS — C50411 Malignant neoplasm of upper-outer quadrant of right female breast: Secondary | ICD-10-CM | POA: Diagnosis not present

## 2020-06-17 DIAGNOSIS — Z170 Estrogen receptor positive status [ER+]: Secondary | ICD-10-CM | POA: Diagnosis not present

## 2020-06-24 ENCOUNTER — Ambulatory Visit: Payer: PPO | Attending: General Surgery

## 2020-06-24 ENCOUNTER — Other Ambulatory Visit: Payer: Self-pay

## 2020-06-24 DIAGNOSIS — Z483 Aftercare following surgery for neoplasm: Secondary | ICD-10-CM | POA: Insufficient documentation

## 2020-06-24 NOTE — Therapy (Signed)
[place], [place], [address] Phone: [phone]   Fax:  [phone]  Physical Therapy Treatment  Patient Details  Name: Kelly Beltran MRN: [id number] Date of Birth: 1955/11/12 Referring Provider (PT): Dr. Stark Klein   Encounter Date: 06/24/2020   PT End of Session - 06/24/20 1542    Visit Number 2   # unchanged due to screen only   Number of Visits 2    PT Start Time 1537    PT Stop Time 1543    PT Time Calculation (min) 6 min    Activity Tolerance Patient tolerated treatment well    Behavior During Therapy [facility] for tasks assessed/performed           Past Medical History:  Diagnosis Date  . 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 Arthritis    osteo arthritis  . Breast CA ([place])    Right Breast  Cancer  . Cancer ([place])   . Diabetes mellitus    TYPE 2  . Elevated cholesterol   . Family history of bladder cancer   . Family history of breast cancer   . Family history of colon cancer   . Family history of leukemia   . Family history of prostate cancer   . Fibroid   . GERD (gastroesophageal reflux disease)   . History of kidney stones    06/12/2019  Passed one 49monthago in her 20's had7 , passed all but 1  . Hypertension   . PONV (postoperative nausea and vomiting)    woke during lithrosplasty, Epidural with child birth - "itching"  . Uterine cancer ([facility]     Past Surgical History:  Procedure Laterality Date  . ABDOMINAL HYSTERECTOMY  1997   REPAIR OF CYSTOTOMY  . BREAST LUMPECTOMY WITH RADIOACTIVE SEED AND SENTINEL LYMPH NODE BIOPSY Right 06/15/2019   Procedure: RIGHT BREAST LUMPECTOMY WITH RADIOACTIVE SEED AND SENTINEL LYMPH NODE BIOPSY;  Surgeon: BStark Klein MD;  [place] [facility] [facility]  Service: General;  Laterality: Right;  COMBINED WITH REGIONAL FOR POST OP PAIN  . CERVIX LESION DESTRUCTION    . CHOLECYSTECTOMY    . COLPOSCOPY    . LITHOTRIPSY      There were no vitals  filed for this visit.   Subjective Assessment - 06/24/20 1539    Subjective Pt returns for 3 month L-Dex screen.    Pertinent History Patient was diagnosed on 04/27/2019 with right grade II invasive ductal carcinoma breast cancer. It is ER positive, PR negative, and HER2 negative with a Ki67 of 10%. Patient underwent a right lumpectomy and sentinel node biopsy (5 negative nodes) on 06/15/2019.                  L-DEX FLOWSHEETS - 06/24/20 1500      L-DEX LYMPHEDEMA SCREENING   Measurement Type Unilateral    L-DEX MEASUREMENT EXTREMITY Upper Extremity    POSITION  Standing    DOMINANT SIDE Right    At Risk Side Right    BASELINE SCORE (UNILATERAL) 0.2    L-DEX SCORE (UNILATERAL) -1.7    VALUE CHANGE (UNILAT) -1.9                                  PT Long Term Goals - 07/13/19 0932      PT LONG TERM GOAL #1   Title Patient will demonstrate she  has regained full shoulder ROM and function post operatively compared to baselines.    Time 8    Period Weeks    Status Partially Met                 Plan - 06/24/20 1546    Clinical Impression Statement Pt returns for her 3 month L-Dex screen. Her change from baseline of -1.9 is WNLs so no further treatment is required at this time except to cont every 3 month L-Dex screens which pt is agreeable to.    PT Next Visit Plan Cont 3 month L-Dex screens for 2 years-up to ~06/14/2021, then every 6 months after that.    Consulted and Agree with Plan of Care Patient           Patient will benefit from skilled therapeutic intervention in order to improve the following deficits and impairments:     Visit Diagnosis: Aftercare following surgery for neoplasm     Problem List Patient Active Problem List   Diagnosis Date Noted  . Coronary artery calcification 06/13/2020  . Statin myopathy 06/13/2020  . Family history of breast cancer   . Family history of bladder cancer   . Family history of colon cancer    . Family history of prostate cancer   . Family history of leukemia   . Malignant neoplasm of upper-outer quadrant of right breast in female, estrogen receptor positive ([place]) 05/18/2019  . Menopausal state 03/30/2012  . 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 Hyperlipidemia   . Kidney stone     Otelia Limes, PTA 06/24/2020, 3:48 PM  [place], [place], [address] Phone: [phone]   Fax:  [phone]  Name: Kelly Beltran MRN: [id number] Date of Birth: 1955-05-14

## 2020-06-26 ENCOUNTER — Encounter: Payer: Self-pay | Admitting: Dietician

## 2020-06-26 ENCOUNTER — Other Ambulatory Visit: Payer: Self-pay

## 2020-06-26 ENCOUNTER — Encounter: Payer: PPO | Attending: Family Medicine | Admitting: Dietician

## 2020-06-26 VITALS — Ht 62.0 in | Wt 165.4 lb

## 2020-06-26 DIAGNOSIS — E119 Type 2 diabetes mellitus without complications: Secondary | ICD-10-CM | POA: Diagnosis not present

## 2020-06-26 NOTE — Patient Instructions (Addendum)
Increase your physical activity by riding your bike 3 times a week around your neighborhood.  Continue to work on eating your carbs consistently at each meal.  Keep checking your blood sugar each day. Look for your fasting numbers to continue to get closer to being under 100 consistently.  Look into Diabetes.org/recipes for diabetic friendly recipes of all types.

## 2020-06-26 NOTE — Progress Notes (Signed)
Diabetes Self-Management Education  Visit Type: Follow-up  Appt. Start Time: 1530 Appt. End Time: 0938  06/26/2020  Kelly Beltran, identified by name and date of birth, is a 65 y.o. female with a diagnosis of Diabetes: Type 2.   ASSESSMENT  Height 5\' 2"  (1.575 m), weight 165 lb 6.4 oz (75 kg). Body mass index is 30.25 kg/m.   Pt states their recent weight loss feels really good.   Pt is looking forward to getting their A1c checked in a month. Pt is now enjoying black grapes and nuts for snacks. Pt reports being prescribed Zetia for blood lipids, has not started yet but will tonight. Pt has a history of hyperlipidemia Pt has found that pasta at lunch and potatoes at dinner raise their fasting blood sugar more than they like. Pt has been logging their BG and looks for trends in their fasting numbers. Pt reports a FBG of 115 this morning. Pt has been checking 2 hours after they eat as well. CBG has gone from around 160 to 130-140 now. Pt has been doing a lot of house work recently, but has not begun doing an extra physical activity yet.   Diabetes Self-Management Education - 06/26/20 1546      Visit Information   Visit Type Follow-up      Initial Visit   Diabetes Type Type 2      Complications   How often do you check your blood sugar? 1-2 times/day    Fasting Blood glucose range (mg/dL) 70-129    Postprandial Blood glucose range (mg/dL) 130-179      Dietary Intake   Breakfast Mini bagel with cream cheese, grapes, coffee with creamer and stevia    Snack (morning) spoonful of cottage cheese    Lunch piece of pot roast, potatoes and carrots    Snack (afternoon) water    Dinner BBQ baked chicken thigh, steamed asparagus    Snack (evening) 6 triscuts and cheese    Beverage(s) coffee, water      Exercise   Exercise Type ADL's;Light (walking / raking leaves)    How many days per week to you exercise? 3    How many minutes per day do you exercise? 30    Total minutes per  week of exercise 90      Patient Self-Evaluation of Goals - Patient rates self as meeting previously set goals (% of time)   Nutrition 50 - 75 %    Physical Activity 25 - 50%    Medications 25 - 50%    Monitoring >75%    Problem Solving >75%   Logging foods that spike their FBG   Reducing Risk 50 - 75 %    Health Coping 50 - 75 %      Post-Education Assessment   Patient understands the diabetes disease and treatment process. Needs Review    Patient understands incorporating nutritional management into lifestyle. Needs Review    Patient undertands incorporating physical activity into lifestyle. Needs Review    Patient understands using medications safely. Needs Review    Patient understands monitoring blood glucose, interpreting and using results Needs Review    Patient understands prevention, detection, and treatment of acute complications. Needs Review    Patient understands prevention, detection, and treatment of chronic complications. Needs Review    Patient understands how to develop strategies to address psychosocial issues. Needs Review    Patient understands how to develop strategies to promote health/change behavior. Needs Review  Outcomes   Expected Outcomes Demonstrated interest in learning. Expect positive outcomes    Future DMSE 4-6 wks    Program Status Not Completed      Subsequent Visit   Since your last visit have you continued or begun to take your medications as prescribed? Yes    Since your last visit have you had your blood pressure checked? Yes    Is your most recent blood pressure lower, unchanged, or higher since your last visit? Unchanged    Since your last visit have you experienced any weight changes? Loss    Weight Loss (lbs) 6    Since your last visit, are you checking your blood glucose at least once a day? Yes           Individualized Plan for Diabetes Self-Management Training:   Learning Objective:  Patient will have a greater understanding  of diabetes self-management. Patient education plan is to attend individual and/or group sessions per assessed needs and concerns.   Plan:   Patient Instructions  Increase your physical activity by riding your bike 3 times a week around your neighborhood.  Continue to work on eating your carbs consistently at each meal.  Keep checking your blood sugar each day. Look for your fasting numbers to continue to get closer to being under 100 consistently.  Look into Diabetes.org/recipes for diabetic friendly recipes of all types.   Expected Outcomes:  Demonstrated interest in learning. Expect positive outcomes  If problems or questions, patient to contact team via:  Phone and Email  Future DSME appointment: 4-6 wks

## 2020-07-07 NOTE — Progress Notes (Signed)
Patient Care Team: Hamrick, Lorin Mercy, MD as PCP - General (Family Medicine) Stark Klein, MD as Consulting Physician (General Surgery) Nicholas Lose, MD as Consulting Physician (Hematology and Oncology) Gery Pray, MD as Consulting Physician (Radiation Oncology)  DIAGNOSIS:    ICD-10-CM   1. Malignant neoplasm of upper-outer quadrant of right breast in female, estrogen receptor positive ([place])  C50.411    Z17.0     SUMMARY OF ONCOLOGIC HISTORY: Oncology History  Malignant neoplasm of upper-outer quadrant of right breast in female, estrogen receptor positive ([place])  05/18/2019 Initial Diagnosis   Screening mammogram detected 0.6cm mass in the right breast, 10 o'clock position. 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   05/24/2019 Cancer Staging   Staging form: Breast, AJCC 8th Edition - Clinical stage from 05/24/2019: Stage IA (cT1b, cN0, cM0, G2, ER+, PR-, HER2-)   06/15/2019 Surgery   Right lumpectomy Barry Dienes) ([id number]): IDC, grade 2, 0.8cm, with intermediate grade DCIS, clear margins, 5 right axillary lymph nodes negative.   07/05/2019 Cancer Staging   Staging form: Breast, AJCC 8th Edition - Pathologic: Stage IA (pT1b, pN0, cM0, G2, ER+, PR-, HER2-, Oncotype DX score: 24)   08/17/2019 - 09/14/2019 Radiation Therapy   The patient initially received a dose of 40.05 Gy in 15 fractions to the breast using whole-breast tangent fields. This was delivered using a 3-D conformal technique. The pt received a boost delivering an additional 12 Gy in 6 fractions using a electron boost with 57mV electrons. The total dose was 52.05 Gy.   09/2019 -  Anti-estrogen oral therapy   Anastrozole     CHIEF COMPLIANT: Follow-up of right breast cancer on anastrozole   INTERVAL HISTORY: LMIKEYA TOMASETTIis a 65y.o. with above-mentioned history of right breast cancer who underwent a lumpectomy, radiation, and is  currently on antiestrogen therapy with anastrozole. Mammogram on 05/02/20 showed no evidence of malignancy bilaterally. She presents to the clinic today for follow-up.   ALLERGIES:  is allergic to statins, lipitor [atorvastatin calcium], mango flavor, pravachol, sulfa antibiotics, tetracyclines & related, and tylenol [acetaminophen].  MEDICATIONS:  Current Outpatient Medications  Medication Sig Dispense Refill  . diclofenac Sodium (VOLTAREN) 1 % GEL Apply 1 g topically in the morning and at bedtime.    .Marland Kitchenesomeprazole (NEXIUM) 40 MG capsule Take 40 mg by mouth every other day.    . ezetimibe (ZETIA) 10 MG tablet Take 1 tablet (10 mg total) by mouth daily. 90 tablet 2  . guaiFENesin (ROBITUSSIN) 100 MG/5ML liquid Take 200 mg by mouth 3 (three) times daily as needed for congestion.    . hydrochlorothiazide (HYDRODIURIL) 25 MG tablet Take 25 mg by mouth daily.    . hydrocortisone (ANUSOL-HC) 2.5 % rectal cream Apply topically.    .Marland Kitchenibuprofen (ADVIL) 200 MG tablet Take 400 mg by mouth every 8 (eight) hours as needed (pain.).    .Marland KitchenJANUMET 50-500 MG tablet Take 1 tablet by mouth 2 (two) times daily.    .Marland Kitchenketotifen (ZADITOR) 0.025 % ophthalmic solution Place 1 drop into both eyes 3 (three) times daily as needed (allergy eyes.).    .Marland Kitchenlisinopril (PRINIVIL,ZESTRIL) 10 MG tablet Take 10 mg by mouth daily.     .Marland Kitchenloratadine (CLARITIN) 10 MG tablet Take 10 mg by mouth daily.     . Probiotic Product (PROBIOTIC PO) Take 1 capsule by mouth daily.    . Vitamin D3 (VITAMIN D) 25  MCG tablet Take 1,000 Units by mouth daily.    . vitamin E 180 MG (400 UNITS) capsule Take 400 Units by mouth daily.    . Zinc 50 MG TABS Take 50 mg by mouth daily.     No current facility-administered medications for this visit.    PHYSICAL EXAMINATION: ECOG PERFORMANCE STATUS: 1 - Symptomatic but completely ambulatory  Vitals:   07/08/20 1137  BP: 118/68  Pulse: 87  Resp: 18  Temp: 97.8 F (36.6 C)  SpO2: 100%   Filed  Weights   07/08/20 1137  Weight: 163 lb 12.8 oz (74.3 kg)    BREAST: No palpable masses or nodules in either right or left breasts. No palpable axillary supraclavicular or infraclavicular adenopathy no breast tenderness or nipple discharge. (exam performed in the presence of a chaperone)  LABORATORY DATA:  I have reviewed the data as listed CMP Latest Ref Rng & Units 06/12/2019 05/24/2019  Glucose 70 - 99 mg/dL 170(H) 201(H)  BUN 8 - 23 mg/dL 19 15  Creatinine 0.44 - 1.00 mg/dL 0.84 0.95  Sodium 135 - 145 mmol/L 139 140  Potassium 3.5 - 5.1 mmol/L 3.5 3.4(L)  Chloride 98 - 111 mmol/L 101 100  CO2 22 - 32 mmol/L 27 26  Calcium 8.9 - 10.3 mg/dL 10.0 10.1  Total Protein 6.5 - 8.1 g/dL - 7.2  Total Bilirubin 0.3 - 1.2 mg/dL - 0.4  Alkaline Phos 38 - 126 U/L - 48  AST 15 - 41 U/L - 18  ALT 0 - 44 U/L - 12    Lab Results  Component Value Date   WBC 7.6 06/12/2019   HGB 14.1 06/12/2019   HCT 43.7 06/12/2019   MCV 86.0 06/12/2019   PLT 285 06/12/2019   NEUTROABS 3.5 05/24/2019    ASSESSMENT & PLAN:  Malignant neoplasm of upper-outer quadrant of right breast in female, estrogen receptor positive ([place]) 06/15/2019: Right lumpectomy: Grade 2 IDC 0.8 cm, intermediate grade DCIS, margins negative, 0/5 lymph nodes negative, ER 100%, PR 0%, Ki-67 10%, HER-2 equivocal by IHC, FISH negative ratio 1.44 T1BN0 stage Ia  Adjuvant radiation 08/18/2019- 09/08/2019  Treatment plan: We prescribed anastrozole but she could not tolerate it because of nausea and hot flashes.  She quit taking it and has felt better This was even at half a tablet daily.   Breast Cancer Surveillance:  1. Mammogram 05/02/20: Benign, density cat A 2. Breast Exam: 07/08/20: Benign  RTC in 1 year     No orders of the defined types were placed in this encounter.  The patient has a good understanding of the overall plan. she agrees with it. she will call with any problems that may develop before the next visit  here.  Total time spent: 20 mins including face to face time and time spent for planning, charting and coordination of care  Rulon Eisenmenger, MD, MPH 07/08/2020  I, Cloyde Reams Dorshimer, am acting as scribe for Dr. Nicholas Lose.  I have reviewed the above documentation for accuracy and completeness, and I agree with the above.

## 2020-07-07 NOTE — Assessment & Plan Note (Signed)
06/15/2019: Right lumpectomy: Grade 2 IDC 0.8 cm, intermediate grade DCIS, margins negative, 0/5 lymph nodes negative, ER 100%, PR 0%, Ki-67 10%, HER-2 equivocal by IHC, FISH negative ratio 1.44 T1BN0 stage Ia  Adjuvant radiation 08/18/2019- 09/08/2019  Treatment plan: Adjuvant antiestrogen therapy with anastrozole 1 mg p.o. daily x5 to 7 years Anastrozole Toxicities:  (1/2 tab daily)   Breast Cancer Surveillance:  1. Mammogram 05/02/20: Benign, density cat A 2. Breast Exam: 07/07/20: Benign  RTC in 1 year

## 2020-07-08 ENCOUNTER — Other Ambulatory Visit: Payer: Self-pay

## 2020-07-08 ENCOUNTER — Inpatient Hospital Stay: Payer: PPO | Attending: Hematology and Oncology | Admitting: Hematology and Oncology

## 2020-07-08 DIAGNOSIS — C50411 Malignant neoplasm of upper-outer quadrant of right female breast: Secondary | ICD-10-CM

## 2020-07-08 DIAGNOSIS — Z79899 Other long term (current) drug therapy: Secondary | ICD-10-CM | POA: Insufficient documentation

## 2020-07-08 DIAGNOSIS — Z79811 Long term (current) use of aromatase inhibitors: Secondary | ICD-10-CM | POA: Insufficient documentation

## 2020-07-08 DIAGNOSIS — Z170 Estrogen receptor positive status [ER+]: Secondary | ICD-10-CM | POA: Diagnosis not present

## 2020-07-08 DIAGNOSIS — Z7984 Long term (current) use of oral hypoglycemic drugs: Secondary | ICD-10-CM | POA: Diagnosis not present

## 2020-07-19 ENCOUNTER — Ambulatory Visit: Payer: 59 | Admitting: Obstetrics & Gynecology

## 2020-07-29 DIAGNOSIS — I10 Essential (primary) hypertension: Secondary | ICD-10-CM | POA: Diagnosis not present

## 2020-07-29 DIAGNOSIS — K219 Gastro-esophageal reflux disease without esophagitis: Secondary | ICD-10-CM | POA: Diagnosis not present

## 2020-07-29 DIAGNOSIS — L700 Acne vulgaris: Secondary | ICD-10-CM | POA: Diagnosis not present

## 2020-07-29 DIAGNOSIS — Z6828 Body mass index (BMI) 28.0-28.9, adult: Secondary | ICD-10-CM | POA: Diagnosis not present

## 2020-07-29 DIAGNOSIS — E1165 Type 2 diabetes mellitus with hyperglycemia: Secondary | ICD-10-CM | POA: Diagnosis not present

## 2020-07-29 DIAGNOSIS — E782 Mixed hyperlipidemia: Secondary | ICD-10-CM | POA: Diagnosis not present

## 2020-07-30 ENCOUNTER — Telehealth: Payer: Self-pay | Admitting: Pharmacist

## 2020-07-30 NOTE — Telephone Encounter (Signed)
Called pt to see if her PCP had rechecked her lipids (was supposed to earlier this month but I don't see anything in [facility] or Care Everywhere). At last visit with me on 5/12, pt had not yet started ezetimibe or Lovaza that were prescribed to her. Wanted to have new baseline labs checked at PCP office. She has also been approved for Repatha but wanted to wait for baseline labs before considering this as well. LDL goal < 70 due to coronary calcifications and DM, TG goal < 150. On 04/26/20 panel, LDL was 143 and TG were 253.  Pt reports she had lab work checked yesterday at [facility] in [place], has not received results yet. Reports she did start taking ezetimibe a week ago but has not started fish oil.   Will call pt later this week once labs result.

## 2020-08-01 NOTE — Telephone Encounter (Signed)
Lipids checked 07/29/20: TC 251, HDL 40, TG 234, LDL 167. Baseline LDL has worsened compared to March labs.  LDL goal < 70 due to coronary calcifications and DM, TG goal < 150.  Pt needs to start her Lovaza that her PCP prescribed, continue on ezetimibe, and add Repatha 140mg  Q2W. PA already approved through 09/11/20, pt would get cost savings as a 3 month supply ($90) vs 1 month supply ($45).   Left message for pt to discuss.

## 2020-08-07 ENCOUNTER — Ambulatory Visit: Payer: PPO | Admitting: Dietician

## 2020-08-07 NOTE — Telephone Encounter (Signed)
Called pt, she states she leaves on vacation tomorrow and doesn't want to start any medication before then. She seems agreeable to starting Lovaza when she returns. States she still wants to think about Repatha and she will call clinic if she is agreeable to starting therapy so we can send rx in.

## 2020-08-10 ENCOUNTER — Encounter (HOSPITAL_COMMUNITY): Payer: Self-pay

## 2020-08-20 DIAGNOSIS — C50911 Malignant neoplasm of unspecified site of right female breast: Secondary | ICD-10-CM | POA: Diagnosis not present

## 2020-08-26 ENCOUNTER — Telehealth: Payer: Self-pay

## 2020-08-26 NOTE — Telephone Encounter (Signed)
CALLED AND SPOKE W/PT AND STATED THAT THEY WERE APPROVED FOR REPATHA IF THEY WANT TO TAKE IT AND THEY STATED THAT THEY ARE NOT READY AND WOULD LIKE TO SEE WHAT HAPPENS TO THEIR LABS IN THE FUTURE

## 2020-08-29 ENCOUNTER — Other Ambulatory Visit: Payer: Self-pay

## 2020-08-29 ENCOUNTER — Other Ambulatory Visit (HOSPITAL_COMMUNITY)
Admission: RE | Admit: 2020-08-29 | Discharge: 2020-08-29 | Disposition: A | Discharge status: Home / Self Care | Payer: PPO | Source: Ambulatory Visit | Attending: Obstetrics & Gynecology | Admitting: Obstetrics & Gynecology

## 2020-08-29 ENCOUNTER — Ambulatory Visit (INDEPENDENT_AMBULATORY_CARE_PROVIDER_SITE_OTHER): Payer: PPO | Admitting: Obstetrics & Gynecology

## 2020-08-29 ENCOUNTER — Encounter: Payer: Self-pay | Admitting: Obstetrics & Gynecology

## 2020-08-29 VITALS — BP 112/70 | HR 72 | Resp 16 | Ht 61.5 in | Wt 157.0 lb

## 2020-08-29 DIAGNOSIS — Z8541 Personal history of malignant neoplasm of cervix uteri: Secondary | ICD-10-CM

## 2020-08-29 DIAGNOSIS — Z01419 Encounter for gynecological examination (general) (routine) without abnormal findings: Secondary | ICD-10-CM

## 2020-08-29 DIAGNOSIS — Z90710 Acquired absence of both cervix and uterus: Secondary | ICD-10-CM

## 2020-08-29 DIAGNOSIS — Z13820 Encounter for screening for osteoporosis: Secondary | ICD-10-CM

## 2020-08-29 DIAGNOSIS — Z9189 Other specified personal risk factors, not elsewhere classified: Secondary | ICD-10-CM

## 2020-08-29 DIAGNOSIS — D069 Carcinoma in situ of cervix, unspecified: Secondary | ICD-10-CM

## 2020-08-29 DIAGNOSIS — Z853 Personal history of malignant neoplasm of breast: Secondary | ICD-10-CM

## 2020-08-29 DIAGNOSIS — Z1272 Encounter for screening for malignant neoplasm of vagina: Secondary | ICD-10-CM

## 2020-08-29 DIAGNOSIS — Z780 Asymptomatic menopausal state: Secondary | ICD-10-CM

## 2020-08-29 DIAGNOSIS — Z170 Estrogen receptor positive status [ER+]: Secondary | ICD-10-CM

## 2020-08-29 DIAGNOSIS — C50411 Malignant neoplasm of upper-outer quadrant of right female breast: Secondary | ICD-10-CM

## 2020-08-29 NOTE — Progress Notes (Signed)
Kelly Beltran 1955-03-05 YE:[id number]   History:    65 y.o. G2P1A1L1 married   RP:  Established patient presenting for annual gyn exam   HPI: Postmenopause, well on no hormone replacement therapy.  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  Pap test normal since the hysterectomy.  No pelvic pain.  Rt Breast Invasive Carcinoma with lumpectomy 06/2019 followed by radiation therapy.  Urine and bowel movements normal.  H/O kidney stones, followed by Urology.  Body mass index 29.18.  Health labs with family physician.  BD normal 06/2014.  Will order Cologard.   Past medical history,surgical history, family history and social history were all reviewed and documented in the EPIC chart.  Gynecologic History No LMP recorded. Patient has had a hysterectomy.  Obstetric History OB History  Gravida Para Term Preterm AB Living  '2 1     1 1  '$ SAB IAB Ectopic Multiple Live Births  1            # Outcome Date GA Lbr Len/2nd Weight Sex Delivery Anes PTL Lv  2 SAB           1 Para              ROS: A ROS was performed and pertinent positives and negatives are included in the history.  GENERAL: No fevers or chills. HEENT: No change in vision, no earache, sore throat or sinus congestion. NECK: No pain or stiffness. CARDIOVASCULAR: No chest pain or pressure. No palpitations. PULMONARY: No shortness of breath, cough or wheeze. GASTROINTESTINAL: No abdominal pain, nausea, vomiting or diarrhea, melena or bright red blood per rectum. GENITOURINARY: No urinary frequency, urgency, hesitancy or dysuria. MUSCULOSKELETAL: No joint or muscle pain, no back pain, no recent trauma. DERMATOLOGIC: No rash, no itching, no lesions. ENDOCRINE: No polyuria, polydipsia, no heat or cold intolerance. No recent change in weight. HEMATOLOGICAL: No anemia or easy bruising or bleeding. NEUROLOGIC: No headache, seizures, numbness, tingling or weakness. PSYCHIATRIC: No depression, no loss of  interest in normal activity or change in sleep pattern.     Exam:   Ht 5' 1.5" (1.562 m)   Wt 157 lb (71.2 kg)   BMI 29.18 kg/m   Body mass index is 29.18 kg/m.  General appearance : Well developed well nourished female. No acute distress HEENT: Eyes: no retinal hemorrhage or exudates,  Neck supple, trachea midline, no carotid bruits, no thyroidmegaly Lungs: Clear to auscultation, no rhonchi or wheezes, or rib retractions  Heart: Regular rate and rhythm, no murmurs or gallops Breast:Examined in sitting and supine position were symmetrical in appearance, no palpable masses or tenderness,  no skin retraction, no nipple inversion, no nipple discharge, no skin discoloration, no axillary or supraclavicular lymphadenopathy Abdomen: no palpable masses or tenderness, no rebound or guarding Extremities: no edema or skin discoloration or tenderness  Pelvic: Vulva: Normal             Vagina: No gross lesions or discharge.  Pap reflex done.  Cervix/Uterus absent  Adnexa  Without masses or tenderness  Anus: Normal   Assessment/Plan:  65 y.o. female for annual exam   1. Encounter for Papanicolaou smear of vagina as part of routine gynecological examination Gynecologic exam s/p Total Hysterectomy.  H/O AIS, Pap reflex done at the Vaginal vault.  Breast exam normal.  Screening mammo negative 04/2020.  [place] ordered.  Health labs with Fam MD.  BMI 29.18.  Continue with fitness and healthy  nutrition. - Cytology - PAP( Industry) - Cologuard  2. Adenocarcinoma in situ (AIS) of uterine cervix Pap reflex done.  3. S/P total hysterectomy  4. Postmenopausal Well on no HRT.  5. Screening for osteoporosis Schedule repeat BD. - DG Bone Density; Future   6. Malignant neoplasm of upper-outer quadrant of right breast in female, estrogen receptor positive ([facility])    Princess Bruins MD, 8:26 AM 08/29/2020

## 2020-09-02 ENCOUNTER — Encounter: Payer: Self-pay | Admitting: Obstetrics & Gynecology

## 2020-09-02 LAB — CYTOLOGY - PAP: Diagnosis: NEGATIVE

## 2020-09-09 ENCOUNTER — Encounter: Payer: Self-pay | Admitting: *Deleted

## 2020-09-11 DIAGNOSIS — C50911 Malignant neoplasm of unspecified site of right female breast: Secondary | ICD-10-CM | POA: Diagnosis not present

## 2020-09-18 ENCOUNTER — Encounter: Payer: PPO | Attending: Family Medicine | Admitting: Dietician

## 2020-09-18 ENCOUNTER — Encounter: Payer: Self-pay | Admitting: Dietician

## 2020-09-18 ENCOUNTER — Other Ambulatory Visit: Payer: Self-pay

## 2020-09-18 VITALS — Ht 62.0 in | Wt 158.0 lb

## 2020-09-18 DIAGNOSIS — E119 Type 2 diabetes mellitus without complications: Secondary | ICD-10-CM | POA: Diagnosis not present

## 2020-09-18 NOTE — Progress Notes (Signed)
Diabetes Self-Management Education  Visit Type: Follow-up  Appt. Start Time: 1100 Appt. End Time: 1140  09/18/2020  [name], identified by name and date of birth, is a 65 y.o. female with a diagnosis of Diabetes:  .   ASSESSMENT Pt has lost 7 pounds since late May. Pt reports wanting to start doing light resistance exercises for their arms and legs. Pt had their A1c checked in June, it is now 6.7 down from 8.0 in January. Pt reports their blood pressure has come down as well, last reading of 128/76. Pt reports sticking to their dietary plan has been the biggest thing in their success of weight loss, A1c reduction, and lowering their blood pressure. Pt reports FBG numbers are usually around 120. Pt reports a noticeable change in FBG after starting Janumet. Pt reports having concerns of their blood lipids. Pt eats multiple servings of cheese each day. Pt reports low level of physical activity recently.  Height '5\' 2"'$  (1.575 m), weight 158 lb (71.7 kg). Body mass index is 28.9 kg/m.   Diabetes Self-Management Education - 09/18/20 1126       Visit Information   Visit Type Follow-up      Complications   Last HgB A1C per patient/outside source 6.7 %   June, 2022   How often do you check your blood sugar? 1-2 times/day    Fasting Blood glucose range (mg/dL) 70-129      Dietary Intake   Breakfast Creamed cauliflower, cream cheese, shredded cheddar, fried egg, thin slice seed bread w/ smart balance butter, a few grapes, coffee with Stevia and cream    Snack (morning) 8-10 green olives    Lunch 1 hot dog w/ no bun, broccoli    Snack (afternoon) Triscuits with cheese    Dinner Ham steak, [place] mashed potatoes, green beans w/ butter    Snack (evening) 4 triscuits with cheese    Beverage(s) water, coffee      Exercise   Exercise Type ADL's      Individualized Goals (developed by patient)   Nutrition Follow meal plan discussed    Physical Activity Exercise 3-5 times per week     Monitoring  test my blood glucose as discussed      Patient Self-Evaluation of Goals - Patient rates self as meeting previously set goals (% of time)   Nutrition 50 - 75 %    Physical Activity 25 - 50%    Medications >75%    Monitoring >75%    Problem Solving 25 - 50%    Reducing Risk 50 - 75 %    Health Coping 50 - 75 %      Post-Education Assessment   Patient understands the diabetes disease and treatment process. Needs Review    Patient understands incorporating nutritional management into lifestyle. Needs Review    Patient undertands incorporating physical activity into lifestyle. Needs Review    Patient understands using medications safely. Needs Review    Patient understands monitoring blood glucose, interpreting and using results Demonstrates understanding / competency    Patient understands prevention, detection, and treatment of acute complications. Needs Review    Patient understands prevention, detection, and treatment of chronic complications. Needs Review    Patient understands how to develop strategies to address psychosocial issues. Needs Review    Patient understands how to develop strategies to promote health/change behavior. Needs Review      Outcomes   Expected Outcomes Demonstrated interest in learning. Expect positive outcomes  Future DMSE 3-4 months    Program Status Not Completed      Subsequent Visit   Since your last visit have you continued or begun to take your medications as prescribed? Yes    Since your last visit have you had your blood pressure checked? Yes    Is your most recent blood pressure lower, unchanged, or higher since your last visit? Lower    Since your last visit have you experienced any weight changes? Loss    Weight Loss (lbs) 7    Since your last visit, are you checking your blood glucose at least once a day? Yes             Individualized Plan for Diabetes Self-Management Training:   Learning Objective:  Patient will have  a greater understanding of diabetes self-management. Patient education plan is to attend individual and/or group sessions per assessed needs and concerns.   Plan:   Patient Instructions  Increase your physical activity using resistance exercises. Lift light dumbbells, walk the stairs, or do calf raises.  Make some guacamole! Top your Triscuits with a small scoop instead of regular cheese. Try hummus as a topping as well.  Use a combination of these three strategies to effectively lower your consumption of saturated fat 1) Consume a smaller portion when having sources of saturated fat 2) Consume saturated fats less frequently 3) Find a reduced or low saturated fat version  Look into http://www.tucker.net/ for good, low fat, heart healthy recipe ideas to lower your cholesterol.  Try Danon "Light-n-Fit" greek yogurt with a little fruit!   Expected Outcomes:  Demonstrated interest in learning. Expect positive outcomes  Education material provided: Adult nurse, Food Sources to Lower Your Cholesterol.  If problems or questions, patient to contact team via:  Phone and Email  Future DSME appointment: 3-4 months

## 2020-09-18 NOTE — Patient Instructions (Addendum)
Increase your physical activity using resistance exercises. Lift light dumbbells, walk the stairs, or do calf raises.  Make some guacamole! Top your Triscuits with a small scoop instead of regular cheese. Try hummus as a topping as well.  Use a combination of these three strategies to effectively lower your consumption of saturated fat 1) Consume a smaller portion when having sources of saturated fat 2) Consume saturated fats less frequently 3) Find a reduced or low saturated fat version  Look into http://www.tucker.net/ for good, low fat, heart healthy recipe ideas to lower your cholesterol.  Try Danon "Light-n-Fit" greek yogurt with a little fruit!

## 2020-09-23 ENCOUNTER — Other Ambulatory Visit: Payer: Self-pay

## 2020-09-23 ENCOUNTER — Ambulatory Visit: Payer: PPO | Attending: General Surgery

## 2020-09-23 VITALS — Wt 152.5 lb

## 2020-09-23 DIAGNOSIS — Z483 Aftercare following surgery for neoplasm: Secondary | ICD-10-CM

## 2020-09-23 NOTE — Therapy (Signed)
[place], [place], [address] Phone: [phone]   Fax:  [phone]  Physical Therapy Treatment  Patient Details  Name: Kelly Beltran MRN: [id number] Date of Birth: Jan 22, 1956 No data recorded  Encounter Date: 09/23/2020   PT End of Session - 09/23/20 1006     Visit Number 2   # unchanged due to screen only   PT Start Time 0958    PT Stop Time 1005    PT Time Calculation (min) 7 min    Activity Tolerance Patient tolerated treatment well    Behavior During Therapy [facility] for tasks assessed/performed             Past Medical History:  Diagnosis Date   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   Arthritis    osteo arthritis   Breast CA ([place])    Right Breast  Cancer   Cancer ([place])    Diabetes mellitus    TYPE 2   Elevated cholesterol    Family history of bladder cancer    Family history of breast cancer    Family history of colon cancer    Family history of leukemia    Family history of prostate cancer    Fibroid    GERD (gastroesophageal reflux disease)    History of kidney stones    06/12/2019  Passed one 30monthago in her 20's had7 , passed all but 1   Hypertension    PONV (postoperative nausea and vomiting)    woke during lithrosplasty, Epidural with child birth - "itching"   [place] cancer ([place]     Past Surgical History:  Procedure Laterality Date   ABDOMINAL HYSTERECTOMY  1997   REPAIR OF CYSTOTOMY   BREAST LUMPECTOMY WITH RADIOACTIVE SEED AND SENTINEL LYMPH NODE BIOPSY Right 06/15/2019   Procedure: RIGHT BREAST LUMPECTOMY WITH RADIOACTIVE SEED AND SENTINEL LYMPH NODE BIOPSY;  Surgeon: BStark Klein MD;  [facility]  Service: General;  Laterality: Right;  COMBINED WITH REGIONAL FOR POST OP PAIN   CERVIX LESION DESTRUCTION     CHOLECYSTECTOMY     COLPOSCOPY     LITHOTRIPSY      Vitals:   09/23/20 0958  Weight: 152 lb 8 oz (69.2 kg)     Subjective Assessment -  09/23/20 0958     Subjective Pt returns for 3 month L-Dex screen. "I started seeing a nutrionist and I've lost 30 lbs!"    Pertinent History Patient was diagnosed on 04/27/2019 with right grade II invasive ductal carcinoma breast cancer. It is ER positive, PR negative, and HER2 negative with a Ki67 of 10%. Patient underwent a right lumpectomy and sentinel node biopsy (5 negative nodes) on 06/15/2019.                    L-DEX FLOWSHEETS - 09/23/20 1000       L-DEX LYMPHEDEMA SCREENING   Measurement Type Unilateral    L-DEX MEASUREMENT EXTREMITY Upper Extremity    POSITION  Standing    DOMINANT SIDE Right    At Risk Side Right    BASELINE SCORE (UNILATERAL) 0.2    L-DEX SCORE (UNILATERAL) 2.1    VALUE CHANGE (UNILAT) 1.9                                    PT Long Term Goals - 07/13/19 04680  PT LONG TERM GOAL #1   Title Patient will demonstrate she has regained full shoulder ROM and function post operatively compared to baselines.    Time 8    Period Weeks    Status Partially Met                   Plan - 09/23/20 1007     Clinical Impression Statement Pt returns for her 3 month L-Dex screen. Her change from baseline of 1.9 is WNLs so no further treatment is required at this time except to cont every 3 month L-Dex screens which pt is agreeable to.    PT Next Visit Plan Cont 3 month L-Dex screens for 2 years-up to ~06/14/2021, then every 6 months after that.    Consulted and Agree with Plan of Care Patient             Patient will benefit from skilled therapeutic intervention in order to improve the following deficits and impairments:     Visit Diagnosis: Aftercare following surgery for neoplasm     Problem List Patient Active Problem List   Diagnosis Date Noted   Coronary artery calcification 06/13/2020   Statin myopathy 06/13/2020   Family history of breast cancer    Family history of bladder cancer    Family  history of colon cancer    Family history of prostate cancer    Family history of leukemia    Malignant neoplasm of upper-outer quadrant of right breast in female, estrogen receptor positive ([place]) 05/18/2019   Menopausal state 03/30/2012   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

## 2020-09-30 DIAGNOSIS — Z9181 History of falling: Secondary | ICD-10-CM | POA: Diagnosis not present

## 2020-09-30 DIAGNOSIS — Z0000 Encounter for general adult medical examination without abnormal findings: Secondary | ICD-10-CM | POA: Diagnosis not present

## 2020-09-30 DIAGNOSIS — E785 Hyperlipidemia, unspecified: Secondary | ICD-10-CM | POA: Diagnosis not present

## 2020-09-30 DIAGNOSIS — Z139 Encounter for screening, unspecified: Secondary | ICD-10-CM | POA: Diagnosis not present

## 2020-09-30 DIAGNOSIS — Z1331 Encounter for screening for depression: Secondary | ICD-10-CM | POA: Diagnosis not present

## 2020-10-09 DIAGNOSIS — Z1212 Encounter for screening for malignant neoplasm of rectum: Secondary | ICD-10-CM | POA: Diagnosis not present

## 2020-10-09 DIAGNOSIS — Z1211 Encounter for screening for malignant neoplasm of colon: Secondary | ICD-10-CM | POA: Diagnosis not present

## 2020-10-14 LAB — COLOGUARD: COLOGUARD: NEGATIVE

## 2020-11-28 DIAGNOSIS — Z139 Encounter for screening, unspecified: Secondary | ICD-10-CM | POA: Diagnosis not present

## 2020-11-28 DIAGNOSIS — I10 Essential (primary) hypertension: Secondary | ICD-10-CM | POA: Diagnosis not present

## 2020-11-28 DIAGNOSIS — E782 Mixed hyperlipidemia: Secondary | ICD-10-CM | POA: Diagnosis not present

## 2020-11-28 DIAGNOSIS — Z6828 Body mass index (BMI) 28.0-28.9, adult: Secondary | ICD-10-CM | POA: Diagnosis not present

## 2020-11-28 DIAGNOSIS — K219 Gastro-esophageal reflux disease without esophagitis: Secondary | ICD-10-CM | POA: Diagnosis not present

## 2020-11-28 DIAGNOSIS — E1165 Type 2 diabetes mellitus with hyperglycemia: Secondary | ICD-10-CM | POA: Diagnosis not present

## 2020-12-09 DIAGNOSIS — H01003 Unspecified blepharitis right eye, unspecified eyelid: Secondary | ICD-10-CM | POA: Diagnosis not present

## 2020-12-17 ENCOUNTER — Ambulatory Visit: Payer: PPO | Admitting: Dietician

## 2020-12-18 DIAGNOSIS — H00011 Hordeolum externum right upper eyelid: Secondary | ICD-10-CM | POA: Diagnosis not present

## 2020-12-30 ENCOUNTER — Other Ambulatory Visit: Payer: Self-pay

## 2020-12-30 ENCOUNTER — Ambulatory Visit: Payer: PPO | Attending: General Surgery

## 2020-12-30 VITALS — Wt 154.4 lb

## 2020-12-30 DIAGNOSIS — Z483 Aftercare following surgery for neoplasm: Secondary | ICD-10-CM | POA: Insufficient documentation

## 2020-12-30 NOTE — Therapy (Signed)
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   Arthritis    osteo arthritis   Breast CA ([place])    Right Breast  Cancer   Cancer ([place])    Diabetes mellitus    TYPE 2   Elevated cholesterol    Family history of bladder cancer    Family history of breast cancer    Family history of colon cancer    Family history of leukemia    Family history of prostate cancer    Fibroid    GERD (gastroesophageal reflux disease)    History of kidney stones    06/12/2019  Passed one 52monthago in her 20's had7 , passed all but 1   Hypertension    PONV (postoperative nausea and vomiting)    woke during lithrosplasty, Epidural with child birth - "itching"   [place]terine cancer ([place]     Past Surgical History:  Procedure Laterality Date   ABDOMINAL HYSTERECTOMY  1997   REPAIR OF CYSTOTOMY   BREAST LUMPECTOMY WITH RADIOACTIVE SEED AND SENTINEL LYMPH NODE BIOPSY Right 06/15/2019   Procedure: RIGHT BREAST LUMPECTOMY WITH RADIOACTIVE SEED AND SENTINEL LYMPH NODE BIOPSY;  Surgeon: BStark Klein MD;  [place] [facility]  Service: General;  Laterality: Right;  COMBINED WITH REGIONAL FOR POST OP PAIN   CERVIX LESION DESTRUCTION     CHOLECYSTECTOMY     COLPOSCOPY     LITHOTRIPSY      Vitals:   12/30/20 1440  Weight: 154 lb 6 oz (70 kg)     Subjective Assessment -  12/30/20 1440     Subjective Pt returns for 3 month L-Dex screen.    Pertinent History Patient was diagnosed on 04/27/2019 with right grade II invasive ductal carcinoma breast cancer. It is ER positive, PR negative, and HER2 negative with a Ki67 of 10%. Patient underwent a right lumpectomy and sentinel node biopsy (5 negative nodes) on 06/15/2019.                    L-DEX FLOWSHEETS - 12/30/20 1400       L-DEX LYMPHEDEMA SCREENING   Measurement Type Unilateral    L-DEX MEASUREMENT EXTREMITY Upper Extremity    POSITION  Standing    DOMINANT SIDE Right    At Risk Side Right    BASELINE SCORE (UNILATERAL) 0.2    L-DEX SCORE (UNILATERAL) 0.8    VALUE CHANGE (UNILAT) 0.6                                     PT Long Term Goals - 07/13/19 0932       PT LONG  TERM GOAL #1   Title Patient will demonstrate she has regained full shoulder ROM and function post operatively compared to baselines.    Time 8    Period Weeks    Status Partially Met                   Plan - 12/30/20 1444     Clinical Impression Statement Pt returns for her 3 month L-Dex screen. Her change from baseline of 0.6 is WNLs so no further treatment is required at this time except to cont every 3 month L-Dex screens which pt is agreeable to.    PT Next Visit Plan Cont 3 month L-Dex screens for 2 years-up to ~06/14/2021, then every 6 months after that.    Consulted and Agree with Plan of Care Patient             Patient will benefit from skilled therapeutic intervention in order to improve the following deficits and impairments:     Visit Diagnosis: Aftercare following surgery for neoplasm     Problem List Patient Active Problem List   Diagnosis Date Noted   Coronary artery calcification 06/13/2020   Statin myopathy 06/13/2020   Family history of breast cancer    Family history of bladder cancer    Family history of colon cancer    Family history of prostate  cancer    Family history of leukemia    Malignant neoplasm of upper-outer quadrant of right breast in female, estrogen receptor positive ([place]) 05/18/2019   Menopausal state 03/30/2012   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

## 2020-12-31 ENCOUNTER — Encounter: Payer: PPO | Attending: Family Medicine | Admitting: Dietician

## 2020-12-31 VITALS — Ht 62.0 in | Wt 155.5 lb

## 2020-12-31 DIAGNOSIS — E119 Type 2 diabetes mellitus without complications: Secondary | ICD-10-CM | POA: Diagnosis not present

## 2020-12-31 NOTE — Progress Notes (Signed)
Diabetes Self-Management Education  Visit Type: Follow-up  Appt. Start Time: 1620 Appt. End Time: 8099  01/08/2021  Kelly Beltran, identified by name and date of birth, is a 65 y.o. female with a diagnosis of Diabetes:  .   ASSESSMENT Pt reports their weight loss has stalled out recently, hasn't changed much in the past month. Pt reports improvement in all lab values, including lipids. FBG range from 108-124, pt says its mostly stays in the 110's. Pt reports eating guacamole and garlic hummus and really enjoy them.  Pt is eating fat-free greek yogurt daily. Pt reports eating cottage cheese Pt is beginning to feel more energy.  Height 5\' 2"  (1.575 m), weight 155 lb 8 oz (70.5 kg). Body mass index is 28.44 kg/m.   Diabetes Self-Management Education - 12/31/20 1600       Visit Information   Visit Type Follow-up      Complications   How often do you check your blood sugar? 1-2 times/day    Fasting Blood glucose range (mg/dL) 70-129      Dietary Intake   Breakfast Cream cheese bagel, grapes    Lunch [place] and stuffing w gravy, grapes    Snack (afternoon) 4 triscuits and cheese    Dinner 3 oz sirloin, baked potato, broccoli      Exercise   Exercise Type ADL's      Individualized Goals (developed by patient)   Nutrition Follow meal plan discussed    Monitoring  test my blood glucose as discussed      Patient Self-Evaluation of Goals - Patient rates self as meeting previously set goals (% of time)   Nutrition 50 - 75 %    Physical Activity 50 - 75 %    Medications >75%    Monitoring >75%    Problem Solving 50 - 75 %    Reducing Risk 50 - 75 %    Health Coping 50 - 75 %      Post-Education Assessment   Patient understands the diabetes disease and treatment process. Needs Review    Patient understands incorporating nutritional management into lifestyle. Needs Review    Patient undertands incorporating physical activity into lifestyle. Needs Review    Patient  understands using medications safely. Demonstrates understanding / competency    Patient understands monitoring blood glucose, interpreting and using results Demonstrates understanding / competency    Patient understands prevention, detection, and treatment of acute complications. Needs Review    Patient understands prevention, detection, and treatment of chronic complications. Needs Review    Patient understands how to develop strategies to address psychosocial issues. Needs Review    Patient understands how to develop strategies to promote health/change behavior. Needs Review      Outcomes   Expected Outcomes Demonstrated interest in learning. Expect positive outcomes    Future DMSE 3-4 months    Program Status Not Completed      Subsequent Visit   Since your last visit have you continued or begun to take your medications as prescribed? Yes    Since your last visit have you had your blood pressure checked? Yes    Is your most recent blood pressure lower, unchanged, or higher since your last visit? Unchanged    Since your last visit have you experienced any weight changes? Gain    Weight Gain (lbs) 3    Since your last visit, are you checking your blood glucose at least once a day? Yes  Individualized Plan for Diabetes Self-Management Training:   Learning Objective:  Patient will have a greater understanding of diabetes self-management. Patient education plan is to attend individual and/or group sessions per assessed needs and concerns.   Plan:   Patient Instructions  When pricking your finger, use the side of your finger near the base of the nail to avoid discomfort!  Continue to identify the sources of fat in your daily diet and find places to moderate your intake further.  Move your scale to under your bed, and only weigh yourself on the same day once a week.    Expected Outcomes:  Demonstrated interest in learning. Expect positive outcomes  If problems or  questions, patient to contact team via:  Phone and Email  Future DSME appointment: 3-4 months

## 2020-12-31 NOTE — Patient Instructions (Addendum)
When pricking your finger, use the side of your finger near the base of the nail to avoid discomfort!  Continue to identify the sources of fat in your daily diet and find places to moderate your intake further.  Move your scale to under your bed, and only weigh yourself on the same day once a week.

## 2021-01-08 ENCOUNTER — Encounter: Payer: Self-pay | Admitting: Dietician

## 2021-02-04 DIAGNOSIS — Z170 Estrogen receptor positive status [ER+]: Secondary | ICD-10-CM | POA: Diagnosis not present

## 2021-02-04 DIAGNOSIS — C50411 Malignant neoplasm of upper-outer quadrant of right female breast: Secondary | ICD-10-CM | POA: Diagnosis not present

## 2021-02-13 ENCOUNTER — Other Ambulatory Visit: Payer: Self-pay | Admitting: *Deleted

## 2021-02-13 DIAGNOSIS — Z79899 Other long term (current) drug therapy: Secondary | ICD-10-CM

## 2021-02-13 DIAGNOSIS — E785 Hyperlipidemia, unspecified: Secondary | ICD-10-CM

## 2021-02-13 DIAGNOSIS — Z789 Other specified health status: Secondary | ICD-10-CM

## 2021-02-13 DIAGNOSIS — E7800 Pure hypercholesterolemia, unspecified: Secondary | ICD-10-CM

## 2021-02-13 MED ORDER — EZETIMIBE 10 MG PO TABS: 10.0000 mg | ORAL_TABLET | Freq: Every day | ORAL | 0 refills | Status: DC

## 2021-03-20 IMAGING — CT CT ABD-PELV W/O CM
2 of 4 series · 16 of 46 positions shown, 18 images · non-contrast
Comparison: None.

CLINICAL DATA: Generalized flank pains. History of kidney stones.

EXAM:
CT ABDOMEN AND PELVIS WITHOUT CONTRAST
TECHNIQUE: Multidetector CT imaging of the abdomen and pelvis was performed
following the standard protocol without IV contrast.

[Series 2: axial st · axial · 0.81mm/px · z∈[+1164,+1534]mm · 13 of 84 slices shown, 15 images]
[im 5/84  soft-tissue]
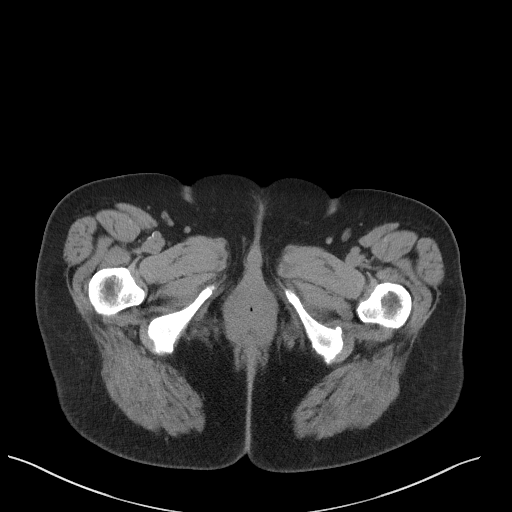
[im 5/84  bone]
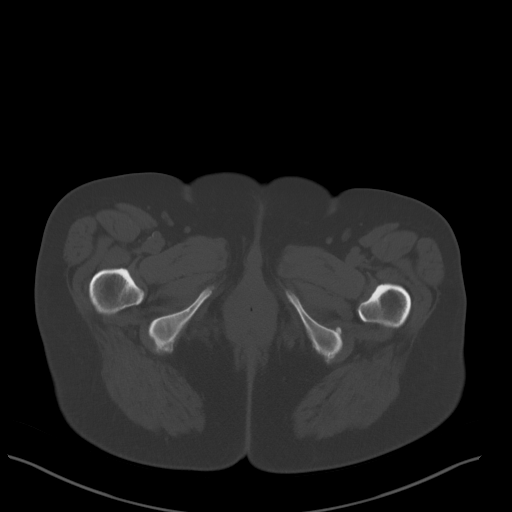
[im 10/84  soft-tissue]
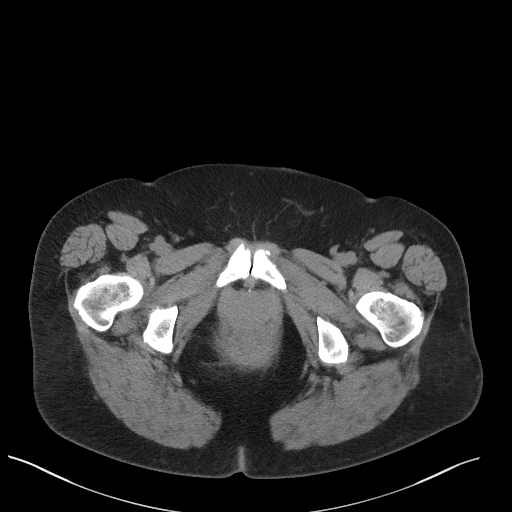
[im 19/84  soft-tissue]
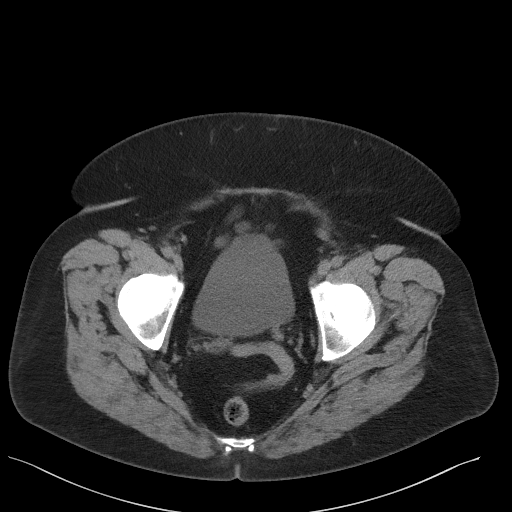
[im 24/84  soft-tissue]
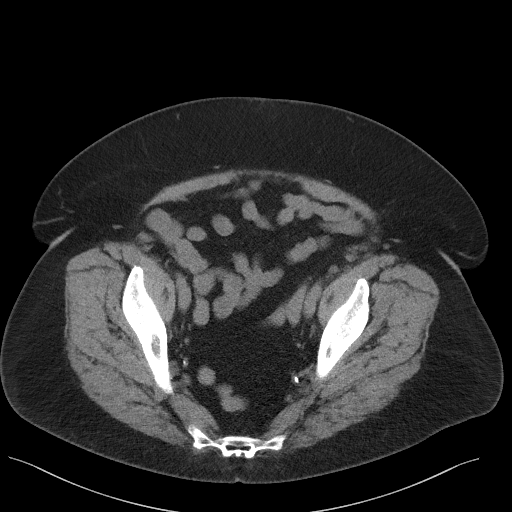
[im 28/84  soft-tissue]
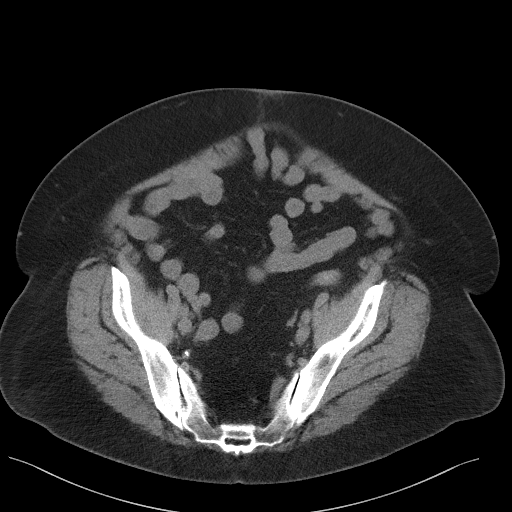
[im 37/84  soft-tissue]
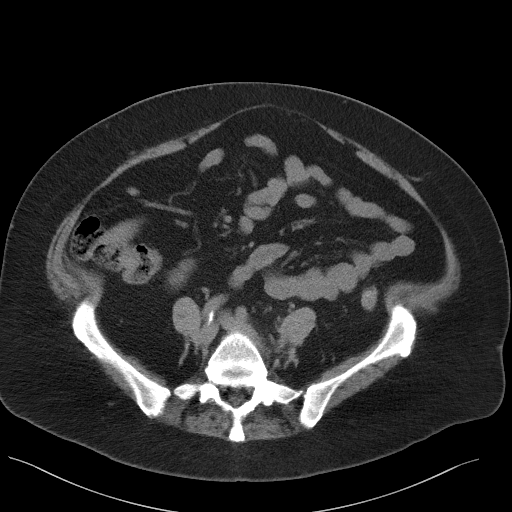
[im 42/84  soft-tissue]
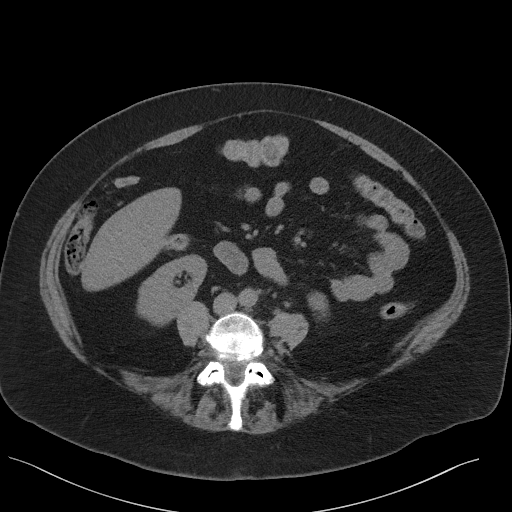
[im 47/84  soft-tissue]
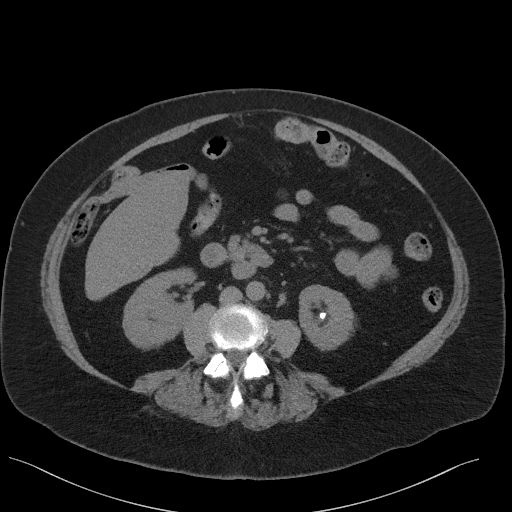
[im 56/84  soft-tissue]
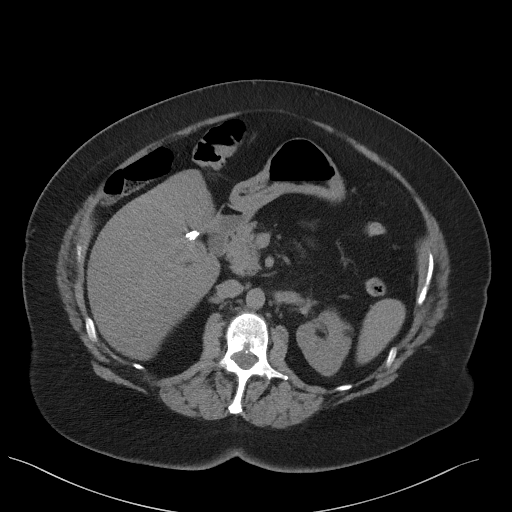
[im 56/84  bone]
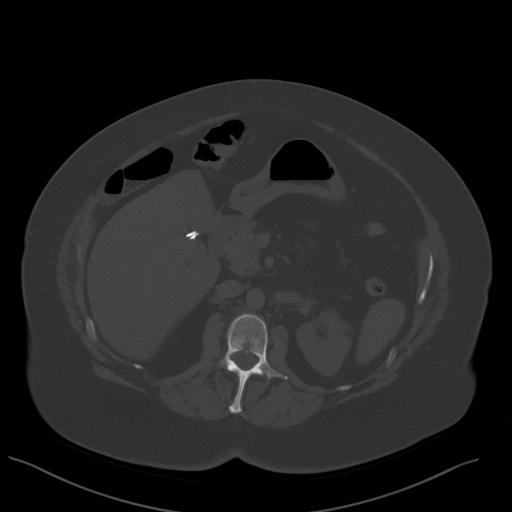
[im 60/84  soft-tissue]
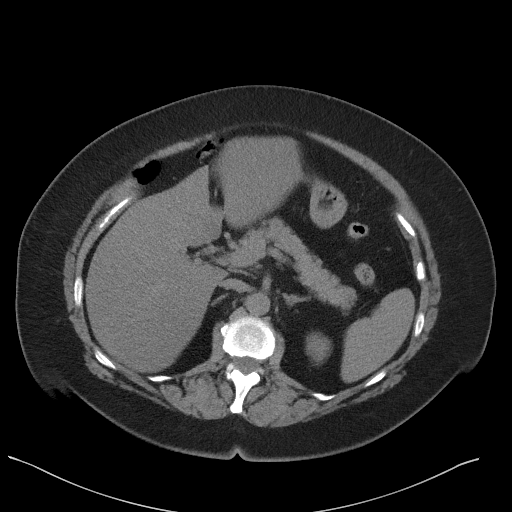
[im 65/84  soft-tissue]
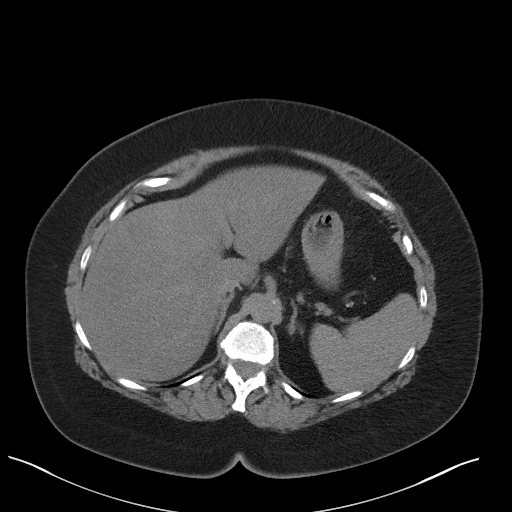
[im 74/84  soft-tissue]
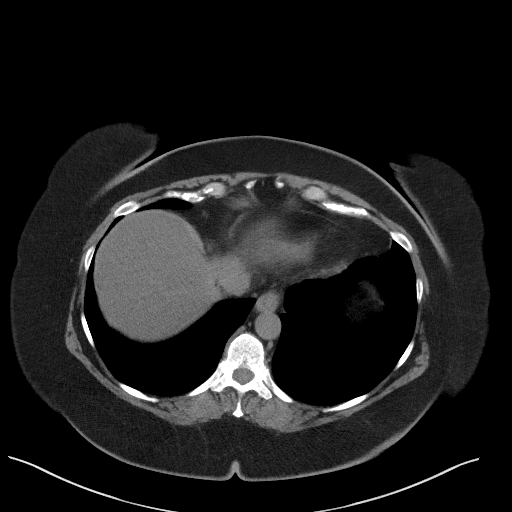
[im 79/84  soft-tissue]
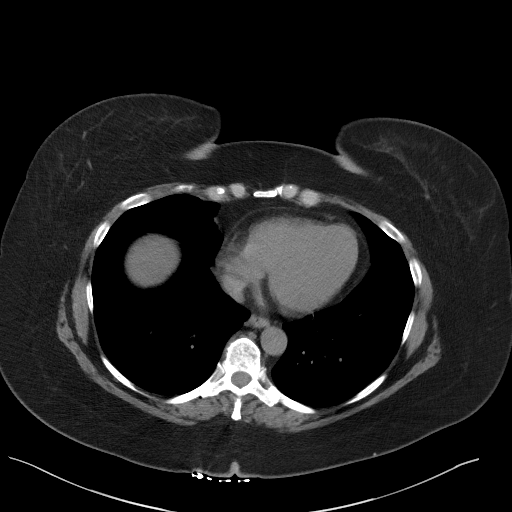

[Series 4: coronal st · coronal · 0.94mm/px · 3 of 95 slices shown]
[im 32/95  soft-tissue]
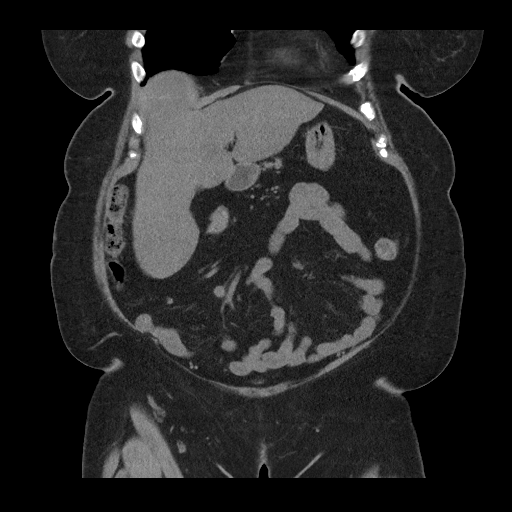
[im 42/95  soft-tissue]
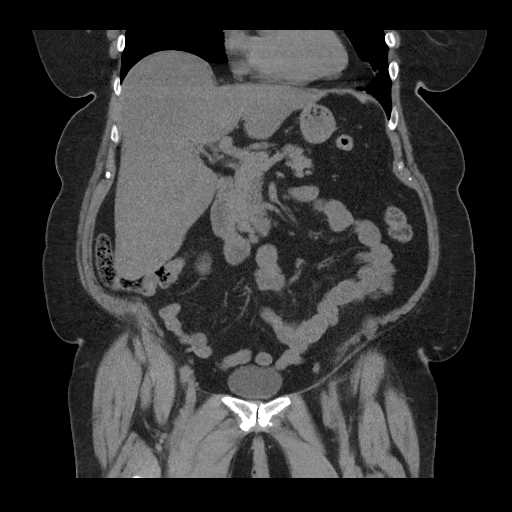
[im 53/95  soft-tissue]
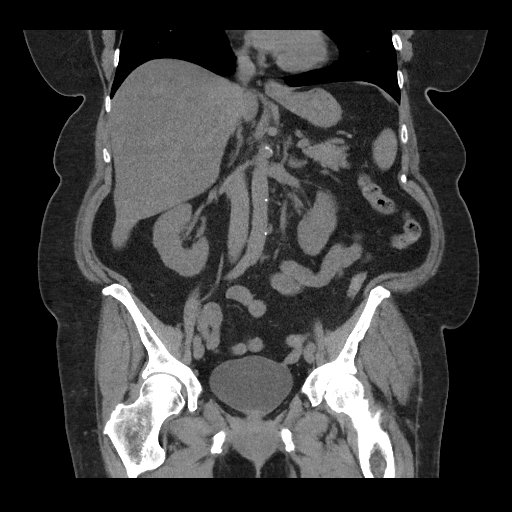

[16 of 46 positions shown; findings below may reference images not displayed]

FINDINGS: Lower chest: 8 mm lingular nodule on image number 2 series 6. Normal
sized heart.

Hepatobiliary: Mild diffuse low density of the liver relative to the
spleen. This is more focal in the medial segment of the left lobe.
Cholecystectomy clips.

Pancreas: Unremarkable. No pancreatic ductal dilatation or
surrounding inflammatory changes.

Spleen: Normal in size without focal abnormality.

Adrenals/Urinary Tract: 9 mm left adrenal nodule on image number 21
series 2. Normal appearing right adrenal gland. 4 mm lower pole left
renal calculus. 6 mm oval fat density mass in the mid upper left
kidney. 5 mm similar mass in the lower pole of the right kidney.
Unremarkable ureters and urinary bladder. No hydronephrosis.

Stomach/Bowel: Stomach is within normal limits. Appendix appears
normal. No evidence of bowel wall thickening, distention, or
inflammatory changes.

Vascular/Lymphatic: Mild atheromatous arterial calcifications. No
enlarged lymph nodes.

Reproductive: Status post hysterectomy. No adnexal masses.

Other: Tiny umbilical hernia containing fat.

Musculoskeletal: Mild lumbar and lower thoracic spine degenerative
changes. Mild scoliosis.
IMPRESSION: 1. No acute abnormality.
2. 4 mm nonobstructing lower pole left renal calculus.
3. Small bilateral renal angiomyolipomas.
recommended. If the nodule is stable at time of repeat CT, then
future CT at 18-24 months (from today's scan) is considered optional
for low-risk patients, but is recommended for high-risk patients.
This recommendation follows the consensus statement: Guidelines for
Management of Incidental Pulmonary Nodules Detected on CT Images:
5. Mild diffuse hepatic steatosis.

## 2021-03-31 ENCOUNTER — Other Ambulatory Visit: Payer: Self-pay

## 2021-03-31 ENCOUNTER — Ambulatory Visit: Payer: PPO | Attending: General Surgery

## 2021-03-31 VITALS — Wt 156.4 lb

## 2021-03-31 DIAGNOSIS — Z483 Aftercare following surgery for neoplasm: Secondary | ICD-10-CM | POA: Insufficient documentation

## 2021-03-31 NOTE — Therapy (Signed)
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   Arthritis    osteo arthritis   Breast CA ([place])    Right Breast  Cancer   Cancer ([place])    Diabetes mellitus    TYPE 2   Elevated cholesterol    Family history of bladder cancer    Family history of breast cancer    Family history of colon cancer    Family history of leukemia    Family history of prostate cancer    Fibroid    GERD (gastroesophageal reflux disease)    History of kidney stones    06/12/2019  Passed one 82monthago in her 20's had7 , passed all but 1   Hypertension    PONV (postoperative nausea and vomiting)    woke during lithrosplasty, Epidural with child birth - "itching"   [place]terine cancer [place]     Past Surgical History:  Procedure Laterality Date   ABDOMINAL HYSTERECTOMY  1997   REPAIR OF CYSTOTOMY   BREAST LUMPECTOMY WITH RADIOACTIVE SEED AND SENTINEL LYMPH NODE BIOPSY Right 06/15/2019   Procedure: RIGHT BREAST LUMPECTOMY WITH RADIOACTIVE SEED AND SENTINEL LYMPH NODE BIOPSY;  Surgeon: BStark Klein MD;  [facility]  Service: General;  Laterality: Right;  COMBINED WITH REGIONAL FOR POST OP PAIN   CERVIX LESION DESTRUCTION     CHOLECYSTECTOMY     COLPOSCOPY     LITHOTRIPSY      Vitals:   03/31/21 1608  Weight: 156 lb 6 oz (70.9 kg)     Subjective Assessment - 03/31/21 1608     Subjective Pt returns for 3 month L-Dex screen.    Pertinent History Patient was diagnosed on  04/27/2019 with right grade II invasive ductal carcinoma breast cancer. It is ER positive, PR negative, and HER2 negative with a Ki67 of 10%. Patient underwent a right lumpectomy and sentinel node biopsy (5 negative nodes) on 06/15/2019.                    L-DEX FLOWSHEETS - 03/31/21 1600       L-DEX LYMPHEDEMA SCREENING   Measurement Type Unilateral    L-DEX MEASUREMENT EXTREMITY Upper Extremity    POSITION  Standing    DOMINANT SIDE Right    At Risk Side Right    BASELINE SCORE (UNILATERAL) 0.2    L-DEX SCORE (UNILATERAL) -0.1    VALUE CHANGE (UNILAT) -0.3                                     PT Long Term Goals - 07/13/19 0932       PT LONG TERM GOAL #1   Title Patient will demonstrate she has regained full shoulder ROM and function post operatively  compared to baselines.    Time 8    Period Weeks    Status Partially Met                   Plan - 03/31/21 1609     Clinical Impression Statement Pt returns for her 3 month L-Dex screen. Her change from baseline of is WNLs so no further treatment is required at this time except to cont every 3 month L-Dex screens which pt is agreeable to.    PT Next Visit Plan Cont 3 month L-Dex screens for 2 years-up to ~06/14/2021, then every 6 months after that.    Consulted and Agree with Plan of Care Patient             Patient will benefit from skilled therapeutic intervention in order to improve the following deficits and impairments:     Visit Diagnosis: Aftercare following surgery for neoplasm     Problem List Patient Active Problem List   Diagnosis Date Noted   Coronary artery calcification 06/13/2020   Statin myopathy 06/13/2020   Family history of breast cancer    Family history of bladder cancer    Family history of colon cancer    Family history of prostate cancer    Family history of leukemia    Malignant neoplasm of upper-outer quadrant of right breast in female,  estrogen receptor positive ([place]) 05/18/2019   Menopausal state 03/30/2012   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

## 2021-04-03 DIAGNOSIS — R911 Solitary pulmonary nodule: Secondary | ICD-10-CM | POA: Diagnosis not present

## 2021-04-03 DIAGNOSIS — I700 Atherosclerosis of aorta: Secondary | ICD-10-CM | POA: Diagnosis not present

## 2021-04-03 DIAGNOSIS — M7918 Myalgia, other site: Secondary | ICD-10-CM | POA: Diagnosis not present

## 2021-04-03 DIAGNOSIS — E782 Mixed hyperlipidemia: Secondary | ICD-10-CM | POA: Diagnosis not present

## 2021-04-03 DIAGNOSIS — K219 Gastro-esophageal reflux disease without esophagitis: Secondary | ICD-10-CM | POA: Diagnosis not present

## 2021-04-03 DIAGNOSIS — E1165 Type 2 diabetes mellitus with hyperglycemia: Secondary | ICD-10-CM | POA: Diagnosis not present

## 2021-04-03 DIAGNOSIS — Z79899 Other long term (current) drug therapy: Secondary | ICD-10-CM | POA: Diagnosis not present

## 2021-04-07 DIAGNOSIS — E1165 Type 2 diabetes mellitus with hyperglycemia: Secondary | ICD-10-CM | POA: Diagnosis not present

## 2021-04-29 ENCOUNTER — Encounter: Payer: PPO | Attending: Family Medicine | Admitting: Dietician

## 2021-04-29 ENCOUNTER — Other Ambulatory Visit: Payer: Self-pay

## 2021-04-29 ENCOUNTER — Encounter: Payer: Self-pay | Admitting: Dietician

## 2021-04-29 VITALS — Ht 62.0 in | Wt 157.0 lb

## 2021-04-29 DIAGNOSIS — E119 Type 2 diabetes mellitus without complications: Secondary | ICD-10-CM | POA: Insufficient documentation

## 2021-04-29 NOTE — Progress Notes (Signed)
Diabetes Self-Management Education ? ?Visit Type: Follow-up ? ?Appt. Start Time: 1000 Appt. End Time: 2130 ? ?04/29/2021 ? ?Kelly Beltran, identified by name and date of birth, is a 66 y.o. female with a diagnosis of Diabetes:  .  ? ?ASSESSMENT ?Pt reports frustration over stagnation of weight loss. Pt reports that they will being building a garden over the next month. Pt will be growing their own veggies and herbs. Pt reports FBG in the low 110's, occasionally 100's. Pt reports that their portion control is very much improved, but still reports over consuming pasta and butter. ?A1c was 6.2 two weeks ago, and pt reports improved lipid profile as well. ?Pt reports one of their friends is a diabetic and trying to improve their A1c. Pt wants to help them be successful and feels that it could be motivation for them to continue making changes in their life. ? ?Height '5\' 2"'$  (1.575 m), weight 157 lb (71.2 kg). ?Body mass index is 28.72 kg/m?. ? ? Diabetes Self-Management Education - 04/29/21 1018   ? ?  ? Visit Information  ? Visit Type Follow-up   ?  ? Complications  ? Last HgB A1C per patient/outside source 6.2 %   March, 2023  ? Fasting Blood glucose range (mg/dL) 70-129   ?  ? Dietary Intake  ? Breakfast Mini bagel, cream cheese, bagel seasoning, green grapes, coffee w/stevia, non-dairy creamer   ? Lunch Egg salad sandwich, wheat bread.   ? Snack (afternoon) 1 scoop ice cream   ? Dinner BBQ meatloaf, brown rice, brussels sprouts   ? Snack (evening) 2 spoons of cottage cheese, peaches   ? Beverage(s) coffee, water   ?  ? Exercise  ? Exercise Type ADL's;Light (walking / raking leaves)   will be gardening in the spring/summer  ?  ? Patient Education  ? Previous Diabetes Education No   ?  ? Individualized Goals (developed by patient)  ? Physical Activity Exercise 3-5 times per week   ? Monitoring  test my blood glucose as discussed   ?  ? Patient Self-Evaluation of Goals - Patient rates self as meeting previously set  goals (% of time)  ? Nutrition 50 - 75 %   ? Physical Activity 25 - 50%   ? Medications >75%   ? Monitoring >75%   ? Problem Solving 50 - 75 %   ? Reducing Risk 50 - 75 %   ? Health Coping 50 - 75 %   ?  ? Outcomes  ? Expected Outcomes Demonstrated interest in learning. Expect positive outcomes   ? Future DMSE 3-4 months   ? Program Status Not Completed   ? ?  ?  ? ?  ? ? ?Individualized Plan for Diabetes Self-Management Training:  ? ?Learning Objective:  Patient will have a greater understanding of diabetes self-management. ?Patient education plan is to attend individual and/or group sessions per assessed needs and concerns. ?  ?Plan:  ? ?Patient Instructions  ?Try some sparkling waters like [place], [place], Polar, or [employer]. ? ?When having peaches, look for options that are packaged in "100% Juice" instead of in Heavy Syrup. ? ?Harness the positivity that comes from being an example for your friend to follow! ? ?Enjoy being active in your garden over the summer. ? ?Keep up the great work!! ? ?Expected Outcomes:  Demonstrated interest in learning. Expect positive outcomes ? ?If problems or questions, patient to contact team via:  Phone and Email ? ?Future DSME appointment: 3-4  months ?

## 2021-04-29 NOTE — Patient Instructions (Addendum)
Try some sparkling waters like LaCroix, Rex, Polar, or [employer]. ? ?When having peaches, look for options that are packaged in "100% Juice" instead of in Heavy Syrup. ? ?Harness the positivity that comes from being an example for your friend to follow! ? ?Enjoy being active in your garden over the summer. ? ?Keep up the great work!! ?

## 2021-05-05 DIAGNOSIS — Z853 Personal history of malignant neoplasm of breast: Secondary | ICD-10-CM | POA: Diagnosis not present

## 2021-05-06 ENCOUNTER — Encounter: Payer: Self-pay | Admitting: Obstetrics & Gynecology

## 2021-06-12 ENCOUNTER — Telehealth: Payer: Self-pay | Admitting: Hematology and Oncology

## 2021-06-12 NOTE — Telephone Encounter (Signed)
Rescheduled appointment per provider PAL. Patient is aware of the changes made to her upcoming appointment. 

## 2021-06-16 ENCOUNTER — Other Ambulatory Visit: Payer: Self-pay | Admitting: *Deleted

## 2021-06-16 DIAGNOSIS — Z789 Other specified health status: Secondary | ICD-10-CM

## 2021-06-16 DIAGNOSIS — E785 Hyperlipidemia, unspecified: Secondary | ICD-10-CM

## 2021-06-16 DIAGNOSIS — Z79899 Other long term (current) drug therapy: Secondary | ICD-10-CM

## 2021-06-16 DIAGNOSIS — E7800 Pure hypercholesterolemia, unspecified: Secondary | ICD-10-CM

## 2021-06-16 MED ORDER — EZETIMIBE 10 MG PO TABS: 10.0000 mg | ORAL_TABLET | Freq: Every day | ORAL | 0 refills | Status: DC

## 2021-07-01 ENCOUNTER — Other Ambulatory Visit: Payer: Self-pay | Admitting: *Deleted

## 2021-07-01 DIAGNOSIS — Z789 Other specified health status: Secondary | ICD-10-CM

## 2021-07-01 DIAGNOSIS — E7800 Pure hypercholesterolemia, unspecified: Secondary | ICD-10-CM

## 2021-07-01 DIAGNOSIS — Z79899 Other long term (current) drug therapy: Secondary | ICD-10-CM

## 2021-07-01 DIAGNOSIS — E785 Hyperlipidemia, unspecified: Secondary | ICD-10-CM

## 2021-07-01 MED ORDER — EZETIMIBE 10 MG PO TABS: 10.0000 mg | ORAL_TABLET | Freq: Every day | ORAL | 0 refills | Status: AC

## 2021-07-08 ENCOUNTER — Ambulatory Visit: Payer: PPO | Admitting: Hematology and Oncology

## 2021-07-11 ENCOUNTER — Other Ambulatory Visit: Payer: Self-pay

## 2021-07-11 DIAGNOSIS — E785 Hyperlipidemia, unspecified: Secondary | ICD-10-CM

## 2021-07-11 DIAGNOSIS — Z789 Other specified health status: Secondary | ICD-10-CM

## 2021-07-11 DIAGNOSIS — E7800 Pure hypercholesterolemia, unspecified: Secondary | ICD-10-CM

## 2021-07-11 DIAGNOSIS — Z79899 Other long term (current) drug therapy: Secondary | ICD-10-CM

## 2021-07-14 ENCOUNTER — Other Ambulatory Visit: Payer: Self-pay

## 2021-07-14 DIAGNOSIS — Z789 Other specified health status: Secondary | ICD-10-CM

## 2021-07-14 DIAGNOSIS — E7800 Pure hypercholesterolemia, unspecified: Secondary | ICD-10-CM

## 2021-07-14 DIAGNOSIS — Z79899 Other long term (current) drug therapy: Secondary | ICD-10-CM

## 2021-07-14 DIAGNOSIS — E785 Hyperlipidemia, unspecified: Secondary | ICD-10-CM

## 2021-07-16 NOTE — Progress Notes (Signed)
Patient Care Team: Hamrick, Lorin Mercy, MD as PCP - General (Family Medicine) Stark Klein, MD as Consulting Physician (General Surgery) Nicholas Lose, MD as Consulting Physician (Hematology and Oncology) Gery Pray, MD as Consulting Physician (Radiation Oncology)  DIAGNOSIS:  Encounter Diagnosis  Name Primary?   Malignant neoplasm of upper-outer quadrant of right breast in female, estrogen receptor positive ([place])     SUMMARY OF ONCOLOGIC HISTORY: Oncology History  Malignant neoplasm of upper-outer quadrant of right breast in female, estrogen receptor positive ([place])  05/18/2019 Initial Diagnosis   Screening mammogram detected 0.6cm mass in the right breast, 10 o'clock position. 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   05/24/2019 Cancer Staging   Staging form: Breast, AJCC 8th Edition - Clinical stage from 05/24/2019: Stage IA (cT1b, cN0, cM0, G2, ER+, PR-, HER2-)   06/15/2019 Surgery   Right lumpectomy Barry Dienes) ([id number]): IDC, grade 2, 0.8cm, with intermediate grade DCIS, clear margins, 5 right axillary lymph nodes negative.   07/05/2019 Cancer Staging   Staging form: Breast, AJCC 8th Edition - Pathologic: Stage IA (pT1b, pN0, cM0, G2, ER+, PR-, HER2-, Oncotype DX score: 24)   08/17/2019 - 09/14/2019 Radiation Therapy   The patient initially received a dose of 40.05 Gy in 15 fractions to the breast using whole-breast tangent fields. This was delivered using a 3-D conformal technique. The pt received a boost delivering an additional 12 Gy in 6 fractions using a electron boost with 64mV electrons. The total dose was 52.05 Gy.   09/2019 -  Anti-estrogen oral therapy   Anastrozole     CHIEF COMPLIANT: Follow-up of right breast cancer     INTERVAL HISTORY: Kelly WITKOPis a 66y.o. with above-mentioned history of right breast cancer. She presents to the clinic today for follow-up. States that  she feels fine. Denies pain and discomfort in breast. States that she has some scar tissue. But overall she is doing fine since she stopped the anastrozole. States that she lost 40 pounds. States that she went to a nutritionist and she is watching her carbs.     ALLERGIES:  is allergic to statins, lipitor [atorvastatin calcium], mango flavor, pravachol, sulfa antibiotics, tetracyclines & related, and tylenol [acetaminophen].  MEDICATIONS:  Current Outpatient Medications  Medication Sig Dispense Refill   famotidine (PEPCID) 20 MG tablet Take 1 tablet (20 mg total) by mouth 2 (two) times daily.     diclofenac Sodium (VOLTAREN) 1 % GEL Apply 1 g topically in the morning and at bedtime.     ezetimibe (ZETIA) 10 MG tablet Take 1 tablet (10 mg total) by mouth daily. 15 tablet 0   guaiFENesin (ROBITUSSIN) 100 MG/5ML liquid Take 200 mg by mouth 3 (three) times daily as needed for congestion.     hydrochlorothiazide (HYDRODIURIL) 25 MG tablet Take 25 mg by mouth daily.     ibuprofen (ADVIL) 200 MG tablet Take 400 mg by mouth every 8 (eight) hours as needed (pain.).     JANUMET 50-500 MG tablet Take 1 tablet by mouth 2 (two) times daily.     ketotifen (ZADITOR) 0.025 % ophthalmic solution Place 1 drop into both eyes 3 (three) times daily as needed (allergy eyes.).     lisinopril (PRINIVIL,ZESTRIL) 10 MG tablet Take 10 mg by mouth daily.      loratadine (CLARITIN) 10 MG tablet Take 10 mg by mouth daily.      Probiotic Product (PROBIOTIC PO)  Take 1 capsule by mouth daily.     Vitamin D3 (VITAMIN D) 25 MCG tablet Take 1,000 Units by mouth daily.     vitamin E 180 MG (400 UNITS) capsule Take 400 Units by mouth daily.     Zinc 50 MG TABS Take 50 mg by mouth daily.     No current facility-administered medications for this visit.    PHYSICAL EXAMINATION: ECOG PERFORMANCE STATUS: 1 - Symptomatic but completely ambulatory  Vitals:   07/30/21 1106  BP: 130/78  Pulse: 85  Resp: 18  Temp: 97.8 F (36.6  C)  SpO2: 99%   Filed Weights   07/30/21 1106  Weight: 157 lb 14.4 oz (71.6 kg)    BREAST: No palpable masses or nodules in either right or left breasts. No palpable axillary supraclavicular or infraclavicular adenopathy no breast tenderness or nipple discharge. (exam performed in the presence of a chaperone)  LABORATORY DATA:  I have reviewed the data as listed    Latest Ref Rng & Units 06/12/2019   11:53 AM 05/24/2019    8:22 AM  CMP  Glucose 70 - 99 mg/dL 170  201   BUN 8 - 23 mg/dL 19  15   Creatinine 0.44 - 1.00 mg/dL 0.84  0.95   Sodium 135 - 145 mmol/L 139  140   Potassium 3.5 - 5.1 mmol/L 3.5  3.4   Chloride 98 - 111 mmol/L 101  100   CO2 22 - 32 mmol/L 27  26   Calcium 8.9 - 10.3 mg/dL 10.0  10.1   Total Protein 6.5 - 8.1 g/dL  7.2   Total Bilirubin 0.3 - 1.2 mg/dL  0.4   Alkaline Phos 38 - 126 U/L  48   AST 15 - 41 U/L  18   ALT 0 - 44 U/L  12     Lab Results  Component Value Date   WBC 7.6 06/12/2019   HGB 14.1 06/12/2019   HCT 43.7 06/12/2019   MCV 86.0 06/12/2019   PLT 285 06/12/2019   NEUTROABS 3.5 05/24/2019    ASSESSMENT & PLAN:  Malignant neoplasm of upper-outer quadrant of right breast in female, estrogen receptor positive (Kelly Beltran) 06/15/2019: Right lumpectomy: Grade 2 IDC 0.8 cm, intermediate grade DCIS, margins negative, 0/5 lymph nodes negative, ER 100%, PR 0%, Ki-67 10%, HER-2 equivocal by IHC, FISH negative ratio 1.44 T1BN0 stage Ia   Adjuvant radiation 08/18/2019- 09/08/2019   Treatment plan:  She could not tolerate anastrozole and therefore we discontinued it.     Breast Cancer Surveillance:  1. Mammogram 05/02/20: Benign, density cat A 2. Breast Exam: 07/08/20: Benign   RTC in 1 year    No orders of the defined types were placed in this encounter.  The patient has a good understanding of the overall plan. she agrees with it. she will call with any problems that may develop before the next visit here. Total time spent: 30 mins including  face to face time and time spent for planning, charting and co-ordination of care   Harriette Ohara, MD 07/30/21    I Gardiner Coins am scribing for Dr. Lindi Adie  I have reviewed the above documentation for accuracy and completeness, and I agree with the above.

## 2021-07-29 NOTE — Assessment & Plan Note (Addendum)
06/15/2019: Right lumpectomy: Grade 2 IDC 0.8 cm, intermediate grade DCIS, margins negative, 0/5 lymph nodes negative, ER 100%, PR 0%, Ki-67 10%, HER-2 equivocal by IHC, FISH negative ratio 1.44 T1BN0 stage Ia  Adjuvant radiation 08/18/2019-09/08/2019  Treatment plan:  She could not tolerate anastrozole and therefore we discontinued it.   Breast Cancer Surveillance:  1. Mammogram 05/02/20: Benign, density cat A 2. Breast Exam: 07/08/20: Benign  RTC in 1 year

## 2021-07-30 ENCOUNTER — Ambulatory Visit: Payer: HMO | Admitting: Dietician

## 2021-07-30 ENCOUNTER — Other Ambulatory Visit: Payer: Self-pay

## 2021-07-30 ENCOUNTER — Inpatient Hospital Stay: Payer: PPO | Attending: Hematology and Oncology | Admitting: Hematology and Oncology

## 2021-07-30 DIAGNOSIS — C50411 Malignant neoplasm of upper-outer quadrant of right female breast: Secondary | ICD-10-CM | POA: Diagnosis not present

## 2021-07-30 DIAGNOSIS — Z170 Estrogen receptor positive status [ER+]: Secondary | ICD-10-CM | POA: Diagnosis not present

## 2021-07-30 MED ORDER — FAMOTIDINE 20 MG PO TABS: 20.0000 mg | ORAL_TABLET | Freq: Two times a day (BID) | ORAL | Status: AC

## 2021-09-15 ENCOUNTER — Ambulatory Visit: Payer: HMO | Attending: General Surgery

## 2021-09-15 VITALS — Wt 154.5 lb

## 2021-09-15 DIAGNOSIS — Z483 Aftercare following surgery for neoplasm: Secondary | ICD-10-CM | POA: Insufficient documentation

## 2021-09-15 NOTE — Therapy (Signed)
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   Arthritis    osteo arthritis   Breast CA ([place])    Right Breast  Cancer   Cancer ([place])    Diabetes mellitus    TYPE 2   Elevated cholesterol    Family history of bladder cancer    Family history of breast cancer    Family history of colon cancer    Family history of leukemia    Family history of prostate cancer    Fibroid    GERD (gastroesophageal reflux disease)    History of kidney stones    06/12/2019  Passed one 9monthago in her 20's had7 , passed all but 1   Hypertension    PONV (postoperative nausea and vomiting)    woke during lithrosplasty, Epidural with child birth - "itching"   [place]terine cancer ([facility]    Past Surgical History:  Procedure Laterality Date   ABDOMINAL HYSTERECTOMY  1997   REPAIR OF CYSTOTOMY   BREAST LUMPECTOMY WITH RADIOACTIVE SEED AND SENTINEL LYMPH NODE BIOPSY Right 06/15/2019   Procedure: RIGHT BREAST LUMPECTOMY WITH RADIOACTIVE SEED AND SENTINEL LYMPH NODE BIOPSY;  Surgeon: BStark Klein MD;  [facility]  Service: General;  Laterality: Right;  COMBINED WITH REGIONAL FOR POST OP PAIN   CERVIX LESION DESTRUCTION     CHOLECYSTECTOMY     COLPOSCOPY     LITHOTRIPSY     Patient Active Problem List   Diagnosis Date Noted   Coronary artery calcification 06/13/2020   Statin myopathy 06/13/2020   Family history of breast cancer    Family history of  bladder cancer    Family history of colon cancer    Family history of prostate cancer    Family history of leukemia    Malignant neoplasm of upper-outer quadrant of right breast in female, estrogen receptor positive ([place] 05/18/2019   Menopausal state 03/30/2012   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 It is ER positive, PR negative, and HER2 negative with a Ki67 of 10%. Patient underwent a right lumpectomy and sentinel node biopsy (5 negative nodes) on 06/15/2019.   PRECAUTIONS: right UE Lymphedema risk, None  SUBJECTIVE: Pt returns for her 3 month L-Dex screen.   PAIN:  Are you having pain? No  SOZO SCREENING: Patient was assessed today using the SOZO machine to determine the lymphedema index score. This was compared to her baseline score. It was determined that she is within the recommended range when compared to her baseline and no further  action is needed at this time. She will continue SOZO screenings. These are done every 3 months for 2 years post operatively followed by every 6 months for 2 years, and then annually.   L-DEX FLOWSHEETS - 09/15/21 0900       L-DEX LYMPHEDEMA SCREENING   Measurement Type Unilateral    L-DEX MEASUREMENT EXTREMITY Upper Extremity    POSITION  Standing    DOMINANT SIDE Right    At Risk Side Right    BASELINE SCORE (UNILATERAL) 0.2    L-DEX SCORE (UNILATERAL) 2.7    VALUE CHANGE (UNILAT) 2.5               Otelia Limes, PTA 09/15/2021, 9:58 AM

## 2021-10-02 DIAGNOSIS — Z0000 Encounter for general adult medical examination without abnormal findings: Secondary | ICD-10-CM | POA: Diagnosis not present

## 2021-10-02 DIAGNOSIS — E785 Hyperlipidemia, unspecified: Secondary | ICD-10-CM | POA: Diagnosis not present

## 2021-10-02 DIAGNOSIS — Z1331 Encounter for screening for depression: Secondary | ICD-10-CM | POA: Diagnosis not present

## 2021-10-03 DIAGNOSIS — R911 Solitary pulmonary nodule: Secondary | ICD-10-CM | POA: Diagnosis not present

## 2021-10-03 DIAGNOSIS — Z1331 Encounter for screening for depression: Secondary | ICD-10-CM | POA: Diagnosis not present

## 2021-10-03 DIAGNOSIS — I700 Atherosclerosis of aorta: Secondary | ICD-10-CM | POA: Diagnosis not present

## 2021-10-03 DIAGNOSIS — Z9181 History of falling: Secondary | ICD-10-CM | POA: Diagnosis not present

## 2021-10-03 DIAGNOSIS — E782 Mixed hyperlipidemia: Secondary | ICD-10-CM | POA: Diagnosis not present

## 2021-10-03 DIAGNOSIS — E785 Hyperlipidemia, unspecified: Secondary | ICD-10-CM | POA: Diagnosis not present

## 2021-10-03 DIAGNOSIS — E1169 Type 2 diabetes mellitus with other specified complication: Secondary | ICD-10-CM | POA: Diagnosis not present

## 2021-10-03 DIAGNOSIS — K219 Gastro-esophageal reflux disease without esophagitis: Secondary | ICD-10-CM | POA: Diagnosis not present

## 2021-10-13 ENCOUNTER — Encounter: Payer: Self-pay | Admitting: Dietician

## 2021-10-13 ENCOUNTER — Encounter: Payer: PPO | Attending: Family Medicine | Admitting: Dietician

## 2021-10-13 DIAGNOSIS — E119 Type 2 diabetes mellitus without complications: Secondary | ICD-10-CM | POA: Diagnosis not present

## 2021-10-13 NOTE — Progress Notes (Signed)
Diabetes Self-Management Education  Visit Type: Follow-up  Appt. Start Time: 0925 Appt. End Time: 1005  10/13/2021  [name], identified by name and date of birth, is a 66 y.o. female with a diagnosis of Diabetes:  .   ASSESSMENT  History includes: Type 2 diabetes, arthritis, cancer, HLD, HTN, GERD. Labs noted:  A1C 6.5% 10/13/2021 (pt report) Medications include: Janumet Supplements: Vitamin D, Vitamin E, Zinc. Weight History: 10/13/21: 160.9 lbs 04/29/21: 157 lbs  10/13/21:  Pt reports that she feels that she has 'fallen off the wagon' and feels motivated to start getting back on track with nutrition and diabetes.   Pt has been gardening this summer. She states she has been eating lots of vegetables from the garden which seems to have promoted vegetable intake.   Pt states she has recently been getting more into exercise with her daughter, which has been motivating her because her daughter has gained weight and she wants to do what is best for her health. She also reports that her husband is now capable of driving so if her knee starts to hurt he is able to pick her up.   04/29/21: Pt reports frustration over stagnation of weight loss. Pt reports that they will being building a garden over the next month. Pt will be growing their own veggies and herbs. Pt reports FBG in the low 110's, occasionally 100's. Pt reports that their portion control is very much improved, but still reports over consuming pasta and butter.  A1c was 6.2 two weeks ago, and pt reports improved lipid profile as well. Pt reports one of their friends is a diabetic and trying to improve their A1c. Pt wants to help them be successful and feels that it could be motivation for them to continue making changes in their life.  Weight 160 lb 14.4 oz (73 kg). Body mass index is 29.43 kg/m.   Diabetes Self-Management Education - 10/13/21 0925       Visit Information   Visit Type Follow-up      Health  Coping   How would you rate your overall health? Fair      Psychosocial Assessment   Patient Belief/Attitude about Diabetes Motivated to manage diabetes    Self-care barriers None    Self-management support Doctor's office    Other persons present Patient    Patient Concerns Nutrition/Meal planning    Special Needs None    Preferred Learning Style No preference indicated    Learning Readiness Ready      Pre-Education Assessment   Patient understands the diabetes disease and treatment process. Comprehends key points    Patient understands incorporating nutritional management into lifestyle. Comprehends key points    Patient undertands incorporating physical activity into lifestyle. Needs Review    Patient understands using medications safely. Comprehends key points    Patient understands monitoring blood glucose, interpreting and using results Comprehends key points    Patient understands prevention, detection, and treatment of acute complications. Comprehends key points    Patient understands prevention, detection, and treatment of chronic complications. Compreheands key points    Patient understands how to develop strategies to address psychosocial issues. Comprehends key points    Patient understands how to develop strategies to promote health/change behavior. Comprehends key points      Complications   Last HgB A1C per patient/outside source 6.5 %    How often do you check your blood sugar? 0 times/day (not testing)      Dietary Intake  Breakfast mini bagel with cream cheese, bagel seasoning and grapes OR seed toast with guacamole    Snack (morning) carb + protein snack from snack sheet    Lunch egg salad OR tuna salad with pita chips or triscuits    Dinner vegetable, brown rice, chicken    Snack (evening) cottage cheese and peaches    Beverage(s) coffee with stevia and cream, water (48oz)      Activity / Exercise   Activity / Exercise Type Light (walking / raking leaves)    exercise bike   How many days per week do you exercise? 2    How many minutes per day do you exercise? 30    Total minutes per week of exercise 60      Patient Education   Previous Diabetes Education Yes (please comment)    Disease Pathophysiology Explored patient's options for treatment of their diabetes    Healthy Eating Role of diet in the treatment of diabetes and the relationship between the three main macronutrients and blood glucose level;Plate Method;Information on hints to eating out and maintain blood glucose control.    Being Active Role of exercise on diabetes management, blood pressure control and cardiac health.;Helped patient identify appropriate exercises in relation to his/her diabetes, diabetes complications and other health issue.    Medications Reviewed patients medication for diabetes, action, purpose, timing of dose and side effects.    Monitoring Identified appropriate SMBG and/or A1C goals.    Chronic complications Relationship between chronic complications and blood glucose control    Diabetes Stress and Support Identified and addressed patients feelings and concerns about diabetes;Worked with patient to identify barriers to care and solutions;Role of stress on diabetes;Brainstormed with patient on coping mechanisms for social situations, getting support from significant others, dealing with feelings about diabetes    Lifestyle and Health Coping Lifestyle issues that need to be addressed for better diabetes care      Individualized Goals (developed by patient)   Nutrition General guidelines for healthy choices and portions discussed    Physical Activity Exercise 5-7 days per week;30 minutes per day    Medications take my medication as prescribed    Monitoring  Test my blood glucose as discussed    Problem Solving Eating Pattern    Reducing Risk examine blood glucose patterns;do foot checks daily    Health Coping Ask for help with psychological, social, or emotional  issues      Patient Self-Evaluation of Goals - Patient rates self as meeting previously set goals (% of time)   Nutrition 50 - 75 % (half of the time)    Physical Activity 25 - 50% (sometimes)    Medications >75% (most of the time)    Monitoring 25 - 50% (sometimes)    Problem Solving and behavior change strategies  25 - 50% (sometimes)    Reducing Risk (treating acute and chronic complications) 50 - 75 % (half of the time)    Health Coping 25 - 50% (sometimes)      Post-Education Assessment   Patient understands the diabetes disease and treatment process. Comprehends key points    Patient understands incorporating nutritional management into lifestyle. Demonstrates understanding / competency    Patient undertands incorporating physical activity into lifestyle. Comprehends key points    Patient understands using medications safely. Comphrehends key points    Patient understands monitoring blood glucose, interpreting and using results Comprehends key points    Patient understands prevention, detection, and treatment of acute complications. Comprehends  key points    Patient understands prevention, detection, and treatment of chronic complications. Demonstrates understanding / competency    Patient understands how to develop strategies to address psychosocial issues. Demonstrates understanding / competency    Patient understands how to develop strategies to promote health/change behavior. Demonstrates understanding / competency      Outcomes   Expected Outcomes Demonstrated interest in learning. Expect positive outcomes    Future DMSE 3-4 months    Program Status Not Completed      Subsequent Visit   Since your last visit have you continued or begun to take your medications as prescribed? Yes    Since your last visit have you experienced any weight changes? Gain    Weight Gain (lbs) 3    Since your last visit, are you checking your blood glucose at least once a day? Yes              Individualized Plan for Diabetes Self-Management Training:   Learning Objective:  Patient will have a greater understanding of diabetes self-management. Patient education plan is to attend individual and/or group sessions per assessed needs and concerns.   Plan:   Patient Instructions  Add a protein to your breakfast.  Aim to have vegetables with lunch and dinner.  For lunch try vegetables and hummus or "[place]" ranch dressing.   Aim for 150 minutes of physical activity weekly.  Consider walking for 15 minutes following breakfast or dinner.  Consider trying to find a chair workout on youtube.   Cook simple meals at home more often than going out.   Stress can increase your blood sugar, so do something everyday for self-care and to bring you joy! Expected Outcomes:  Demonstrated interest in learning. Expect positive outcomes  If problems or questions, patient to contact team via:  Phone  Future DSME appointment: 3-4 months

## 2021-10-13 NOTE — Patient Instructions (Addendum)
Add a protein to your breakfast.  Aim to have vegetables with lunch and dinner.  For lunch try vegetables and hummus or "[place]" ranch dressing.   Aim for 150 minutes of physical activity weekly.  Consider walking for 15 minutes following breakfast or dinner.  Consider trying to find a chair workout on youtube.   Cook simple meals at home more often than going out.   Stress can increase your blood sugar, so do something everyday for self-care and to bring you joy!

## 2022-01-14 ENCOUNTER — Ambulatory Visit: Payer: PPO | Admitting: Dietician

## 2022-02-25 ENCOUNTER — Ambulatory Visit: Payer: PPO | Admitting: Dietician

## 2022-03-09 DIAGNOSIS — C50411 Malignant neoplasm of upper-outer quadrant of right female breast: Secondary | ICD-10-CM | POA: Diagnosis not present

## 2022-03-09 DIAGNOSIS — Z170 Estrogen receptor positive status [ER+]: Secondary | ICD-10-CM | POA: Diagnosis not present

## 2022-03-16 ENCOUNTER — Ambulatory Visit: Payer: PPO

## 2022-04-06 ENCOUNTER — Ambulatory Visit: Payer: PPO | Attending: General Surgery

## 2022-04-06 VITALS — Wt 157.0 lb

## 2022-04-06 DIAGNOSIS — Z483 Aftercare following surgery for neoplasm: Secondary | ICD-10-CM | POA: Insufficient documentation

## 2022-04-06 NOTE — Therapy (Signed)
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   Arthritis    osteo arthritis   Breast CA ([place])    Right Breast  Cancer   Cancer ([place])    Diabetes mellitus    TYPE 2   Elevated cholesterol    Family history of bladder cancer    Family history of breast cancer    Family history of colon cancer    Family history of leukemia    Family history of prostate cancer    Fibroid    GERD (gastroesophageal reflux disease)    History of kidney stones    06/12/2019  Passed one 56monthago in her 20's had7 , passed all but 1   Hypertension    PONV (postoperative nausea and vomiting)    woke during lithrosplasty, Epidural with child birth - "itching"   [place]    Past Surgical History:  Procedure Laterality Date   ABDOMINAL HYSTERECTOMY  1997   REPAIR OF CYSTOTOMY   BREAST LUMPECTOMY WITH RADIOACTIVE SEED AND SENTINEL LYMPH NODE BIOPSY Right 06/15/2019   Procedure: RIGHT BREAST LUMPECTOMY WITH RADIOACTIVE SEED AND SENTINEL LYMPH NODE BIOPSY;  Surgeon: BStark Klein MD;  [facility]  Service: General;  Laterality: Right;  COMBINED WITH REGIONAL FOR POST OP PAIN   CERVIX LESION DESTRUCTION     CHOLECYSTECTOMY     COLPOSCOPY     LITHOTRIPSY     Patient Active Problem List   Diagnosis Date Noted   Coronary artery calcification 06/13/2020   Statin myopathy 06/13/2020   Family history of breast cancer    Family history of  bladder cancer    Family history of colon cancer    Family history of prostate cancer    Family history of leukemia    Malignant neoplasm of upper-outer quadrant of right breast in female, estrogen receptor positive ([facility] 05/18/2019   Menopausal state 03/30/2012   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 It is ER positive, PR negative, and HER2 negative with a Ki67 of 10%. Patient underwent a right lumpectomy and sentinel node biopsy (5 negative nodes) on 06/15/2019.   PRECAUTIONS: right UE Lymphedema risk, None  SUBJECTIVE: Pt returns for her 6 month L-Dex screen.   PAIN:  Are you having pain? No  SOZO SCREENING: Patient was assessed today using the SOZO machine to determine the lymphedema index score. This was compared to her baseline score. It was determined that she is within the recommended range when compared to her baseline and no further action  is needed at this time. She will continue SOZO screenings. These are done every 3 months for 2 years post operatively followed by every 6 months for 2 years, and then annually.   L-DEX FLOWSHEETS - 04/06/22 0900       L-DEX LYMPHEDEMA SCREENING   Measurement Type Unilateral    L-DEX MEASUREMENT EXTREMITY Upper Extremity    POSITION  Standing    DOMINANT SIDE Right    At Risk Side Right    BASELINE SCORE (UNILATERAL) 0.2    L-DEX SCORE (UNILATERAL) -1.9    VALUE CHANGE (UNILAT) -2.1               Otelia Limes, PTA 04/06/2022, 9:39 AM

## 2022-04-07 DIAGNOSIS — E782 Mixed hyperlipidemia: Secondary | ICD-10-CM | POA: Diagnosis not present

## 2022-04-07 DIAGNOSIS — Z79899 Other long term (current) drug therapy: Secondary | ICD-10-CM | POA: Diagnosis not present

## 2022-04-07 DIAGNOSIS — I700 Atherosclerosis of aorta: Secondary | ICD-10-CM | POA: Diagnosis not present

## 2022-04-07 DIAGNOSIS — K219 Gastro-esophageal reflux disease without esophagitis: Secondary | ICD-10-CM | POA: Diagnosis not present

## 2022-04-07 DIAGNOSIS — R911 Solitary pulmonary nodule: Secondary | ICD-10-CM | POA: Diagnosis not present

## 2022-04-07 DIAGNOSIS — E1169 Type 2 diabetes mellitus with other specified complication: Secondary | ICD-10-CM | POA: Diagnosis not present

## 2022-04-07 DIAGNOSIS — G720 Drug-induced myopathy: Secondary | ICD-10-CM | POA: Diagnosis not present

## 2022-04-07 DIAGNOSIS — L290 Pruritus ani: Secondary | ICD-10-CM | POA: Diagnosis not present

## 2022-04-07 DIAGNOSIS — T466X5A Adverse effect of antihyperlipidemic and antiarteriosclerotic drugs, initial encounter: Secondary | ICD-10-CM | POA: Diagnosis not present

## 2022-05-08 DIAGNOSIS — Z1231 Encounter for screening mammogram for malignant neoplasm of breast: Secondary | ICD-10-CM | POA: Diagnosis not present

## 2022-05-11 ENCOUNTER — Encounter: Payer: Self-pay | Admitting: Obstetrics & Gynecology

## 2022-06-18 DIAGNOSIS — J020 Streptococcal pharyngitis: Secondary | ICD-10-CM | POA: Diagnosis not present

## 2022-06-18 DIAGNOSIS — J019 Acute sinusitis, unspecified: Secondary | ICD-10-CM | POA: Diagnosis not present

## 2022-06-18 DIAGNOSIS — I10 Essential (primary) hypertension: Secondary | ICD-10-CM | POA: Diagnosis not present

## 2022-06-18 DIAGNOSIS — J029 Acute pharyngitis, unspecified: Secondary | ICD-10-CM | POA: Diagnosis not present

## 2022-08-01 NOTE — Progress Notes (Signed)
Patient Care Team: Hamrick, Durward Fortes, MD as PCP - General (Family Medicine) Almond Lint, MD as Consulting Physician (General Surgery) Serena Croissant, MD as Consulting Physician (Hematology and Oncology) Antony Blackbird, MD as Consulting Physician (Radiation Oncology)  DIAGNOSIS: No diagnosis found.  SUMMARY OF ONCOLOGIC HISTORY: Oncology History  Malignant neoplasm of upper-outer quadrant of right breast in female, estrogen receptor positive ([facility])  05/18/2019 Initial Diagnosis   Screening mammogram detected 0.6cm mass in the right breast, 10 o'clock position. 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   05/24/2019 Cancer Staging   Staging form: Breast, AJCC 8th Edition - Clinical stage from 05/24/2019: Stage IA (cT1b, cN0, cM0, G2, ER+, PR-, HER2-)   06/15/2019 Surgery   Right lumpectomy Donell Beers) ([id number]): IDC, grade 2, 0.8cm, with intermediate grade DCIS, clear margins, 5 right axillary lymph nodes negative.   07/05/2019 Cancer Staging   Staging form: Breast, AJCC 8th Edition - Pathologic: Stage IA (pT1b, pN0, cM0, G2, ER+, PR-, HER2-, Oncotype DX score: 24)   08/17/2019 - 09/14/2019 Radiation Therapy   The patient initially received a dose of 40.05 Gy in 15 fractions to the breast using whole-breast tangent fields. This was delivered using a 3-D conformal technique. The pt received a boost delivering an additional 12 Gy in 6 fractions using a electron boost with electrons. The total dose was 52.05 Gy.   09/2019 -  Anti-estrogen oral therapy   Anastrozole     CHIEF COMPLIANT:   INTERVAL HISTORY: Kelly Beltran is a   ALLERGIES:  is allergic to statins, lipitor [atorvastatin calcium], mango flavor, pravachol, sulfa antibiotics, tetracyclines & related, and tylenol [acetaminophen].  MEDICATIONS:  Current Outpatient Medications  Medication Sig Dispense Refill   diclofenac Sodium (VOLTAREN) 1  % GEL Apply 1 g topically in the morning and at bedtime.     ezetimibe (ZETIA) 10 MG tablet Take 1 tablet (10 mg total) by mouth daily. 15 tablet 0   famotidine (PEPCID) 20 MG tablet Take 1 tablet (20 mg total) by mouth 2 (two) times daily.     guaiFENesin (ROBITUSSIN) 100 MG/5ML liquid Take 200 mg by mouth 3 (three) times daily as needed for congestion.     hydrochlorothiazide (HYDRODIURIL) 25 MG tablet Take 25 mg by mouth daily.     ibuprofen (ADVIL) 200 MG tablet Take 400 mg by mouth every 8 (eight) hours as needed (pain.).     JANUMET 50-500 MG tablet Take 1 tablet by mouth 2 (two) times daily.     ketotifen (ZADITOR) 0.025 % ophthalmic solution Place 1 drop into both eyes 3 (three) times daily as needed (allergy eyes.).     lisinopril (PRINIVIL,ZESTRIL) 10 MG tablet Take 10 mg by mouth daily.      loratadine (CLARITIN) 10 MG tablet Take 10 mg by mouth daily.      Probiotic Product (PROBIOTIC PO) Take 1 capsule by mouth daily.     Vitamin D3 (VITAMIN D) 25 MCG tablet Take 1,000 Units by mouth daily.     vitamin E 180 MG (400 UNITS) capsule Take 400 Units by mouth daily.     Zinc 50 MG TABS Take 50 mg by mouth daily.     No current facility-administered medications for this visit.    PHYSICAL EXAMINATION: ECOG PERFORMANCE STATUS: {CHL ONC ECOG [phone]}  There were no vitals filed for this visit. There were no vitals filed for this visit.  BREAST:***  No palpable masses or nodules in either right or left breasts. No palpable axillary supraclavicular or infraclavicular adenopathy no breast tenderness or nipple discharge. (exam performed in the presence of a chaperone)  LABORATORY DATA:  I have reviewed the data as listed    Latest Ref Rng & Units 06/12/2019   11:53 AM 05/24/2019    8:22 AM  CMP  Glucose 70 - 99 mg/dL 161  096   BUN 8 - 23 mg/dL 19  15   Creatinine 0.45 - 1.00 mg/dL 4.09  8.11   Sodium 914 - 145 mmol/L 139  140   Potassium 3.5 - 5.1 mmol/L 3.5  3.4   Chloride  98 - 111 mmol/L 101  100   CO2 22 - 32 mmol/L 27  26   Calcium 8.9 - 10.3 mg/dL 78.2  95.6   Total Protein 6.5 - 8.1 g/dL  7.2   Total Bilirubin 0.3 - 1.2 mg/dL  0.4   Alkaline Phos 38 - 126 U/L  48   AST 15 - 41 U/L  18   ALT 0 - 44 U/L  12     Lab Results  Component Value Date   WBC 7.6 06/12/2019   HGB 14.1 06/12/2019   HCT 43.7 06/12/2019   MCV 86.0 06/12/2019   PLT 285 06/12/2019   NEUTROABS 3.5 05/24/2019    ASSESSMENT & PLAN:  No problem-specific Assessment & Plan notes found for this encounter.    No orders of the defined types were placed in this encounter.  The patient has a good understanding of the overall plan. she agrees with it. she will call with any problems that may develop before the next visit here. Total time spent: 30 mins including face to face time and time spent for planning, charting and co-ordination of care   Sherlyn Lick, CMA 08/01/22    I Janan Ridge am acting as a Neurosurgeon for [employer]  ***

## 2022-08-03 ENCOUNTER — Other Ambulatory Visit: Payer: Self-pay

## 2022-08-03 ENCOUNTER — Inpatient Hospital Stay: Payer: Medicare HMO | Attending: Hematology and Oncology | Admitting: Hematology and Oncology

## 2022-08-03 VITALS — BP 130/77 | HR 79 | Temp 97.7°F | Resp 18 | Ht 62.0 in | Wt 161.8 lb

## 2022-08-03 DIAGNOSIS — C50411 Malignant neoplasm of upper-outer quadrant of right female breast: Secondary | ICD-10-CM | POA: Insufficient documentation

## 2022-08-03 DIAGNOSIS — Z170 Estrogen receptor positive status [ER+]: Secondary | ICD-10-CM | POA: Diagnosis not present

## 2022-08-03 DIAGNOSIS — Z79811 Long term (current) use of aromatase inhibitors: Secondary | ICD-10-CM | POA: Insufficient documentation

## 2022-08-03 NOTE — Assessment & Plan Note (Signed)
06/15/2019: Right lumpectomy: Grade 2 IDC 0.8 cm, intermediate grade DCIS, margins negative, 0/5 lymph nodes negative, ER 100%, PR 0%, Ki-67 10%, HER-2 equivocal by IHC, FISH negative ratio 1.44 T1BN0 stage Ia   Adjuvant radiation 08/18/2019- 09/08/2019   Treatment plan:  She could not tolerate anastrozole and therefore we discontinued it.    Breast Cancer Surveillance:  1. Mammogram 05/08/2022: Benign, density cat A 2. Breast Exam: 08/03/2022: Benign   RTC in 1 year

## 2022-08-28 DIAGNOSIS — L820 Inflamed seborrheic keratosis: Secondary | ICD-10-CM | POA: Diagnosis not present

## 2022-08-28 DIAGNOSIS — L708 Other acne: Secondary | ICD-10-CM | POA: Diagnosis not present

## 2022-08-28 DIAGNOSIS — L821 Other seborrheic keratosis: Secondary | ICD-10-CM | POA: Diagnosis not present

## 2022-09-18 NOTE — Progress Notes (Signed)
67 y.o. G72P0011 Married Caucasian female here for annual exam.    No vaginal bleeding.  No problems with bladder or bowel function or control.  No breast concerns.   Lost 41 pounds.   Has some numbness in her hand and an aching arm.  She will see her PCP in follow up.   Husband had a stroke.  3 yo daughter living at home.  Has special needs due to developmental delay.  Patient has an organization, [employer], to care for others who had pregnancy loss of any kind.   PCP:   Burnell Blanks, MD  No LMP recorded. Patient has had a hysterectomy.           Sexually active: Yes.    The current method of family planning is status post hysterectomy.    Exercising: Yes.     Exercise program and biking Smoker:  no  Health Maintenance: Pap:  08/29/20 neg History of abnormal Pap:  yes, hx of adenocarcinoma in situ of the cervix.  MMG:  05/08/22 Breast Density Cat A, BI-RADS CAT 2 benign Colonoscopy:  n/a.  Cologuard 10/09/20 - negative.  BMD:   06/26/14  Result  WNL TDaP: unsure Gardasil:   no HIV: n/a Hep C: 2/26/4 neg Screening Labs:  PCP.   reports that she has never smoked. She has never used smokeless tobacco. She reports that she does not currently use alcohol. She reports that she does not use drugs.  Past Medical History:  Diagnosis Date   Adenocarcinoma of cervix (HCC)    IN SITU   Arthritis    osteo arthritis   Breast CA (HCC)    Right Breast  Cancer   Cancer (HCC)    Diabetes mellitus    TYPE 2   Elevated cholesterol    Family history of bladder cancer    Family history of breast cancer    Family history of colon cancer    Family history of leukemia    Family history of prostate cancer    Fibroid    GERD (gastroesophageal reflux disease)    History of kidney stones    06/12/2019  Passed one 35month ago in her 20's had7 , passed all but 1   Hypertension    PONV (postoperative nausea and vomiting)    woke during lithrosplasty, Epidural with child birth - "itching"    Uterine cancer (HCC)     Past Surgical History:  Procedure Laterality Date   ABDOMINAL HYSTERECTOMY  1997   REPAIR OF CYSTOTOMY   BREAST LUMPECTOMY WITH RADIOACTIVE SEED AND SENTINEL LYMPH NODE BIOPSY Right 06/15/2019   Procedure: RIGHT BREAST LUMPECTOMY WITH RADIOACTIVE SEED AND SENTINEL LYMPH NODE BIOPSY;  Surgeon: Almond Lint, MD;  Location: [facility] OR;  Service: General;  Laterality: Right;  COMBINED WITH REGIONAL FOR POST OP PAIN   CERVIX LESION DESTRUCTION     CHOLECYSTECTOMY     COLPOSCOPY     LITHOTRIPSY      Current Outpatient Medications  Medication Sig Dispense Refill   diclofenac Sodium (VOLTAREN) 1 % GEL Apply 1 g topically in the morning and at bedtime.     ezetimibe (ZETIA) 10 MG tablet Take 1 tablet (10 mg total) by mouth daily. 15 tablet 0   famotidine (PEPCID) 20 MG tablet Take 1 tablet (20 mg total) by mouth 2 (two) times daily.     guaiFENesin (ROBITUSSIN) 100 MG/5ML liquid Take 200 mg by mouth 3 (three) times daily as needed for congestion.  hydrochlorothiazide (HYDRODIURIL) 25 MG tablet Take 25 mg by mouth daily.     ibuprofen (ADVIL) 200 MG tablet Take 400 mg by mouth every 8 (eight) hours as needed (pain.).     JANUMET 50-500 MG tablet Take 1 tablet by mouth 2 (two) times daily.     ketotifen (ZADITOR) 0.025 % ophthalmic solution Place 1 drop into both eyes 3 (three) times daily as needed (allergy eyes.).     lisinopril (PRINIVIL,ZESTRIL) 10 MG tablet Take 10 mg by mouth daily.      loratadine (CLARITIN) 10 MG tablet Take 10 mg by mouth daily.      Probiotic Product (PROBIOTIC PO) Take 1 capsule by mouth daily.     Vitamin D3 (VITAMIN D) 25 MCG tablet Take 1,000 Units by mouth daily.     vitamin E 180 MG (400 UNITS) capsule Take 400 Units by mouth daily.     Zinc 50 MG TABS Take 50 mg by mouth daily.     No current facility-administered medications for this visit.    Family History  Problem Relation Age of Onset   Diabetes Father    Hypertension Father     Prostate cancer Father 73   Diabetes Mother    Hypertension Mother    Breast cancer Mother 34       2nd diagnosis at 28   Atrial fibrillation Mother    Colon cancer Mother 48   Bladder Cancer Mother 14   Stroke Paternal Grandmother    Breast cancer Other        maternal cousin's daughter; dx. in her 55s, double mastectomy, may have had positive genetic test   Leukemia Maternal Aunt 1    Review of Systems  All other systems reviewed and are negative.   Exam:   BP 128/82 (BP Location: Left Arm, Patient Position: Sitting, Cuff Size: Normal)   Pulse 87   Ht 5' 2.5" (1.588 m)   Wt 164 lb (74.4 kg)   SpO2 98%   BMI 29.52 kg/m     General appearance: alert, cooperative and appears stated age Head: normocephalic, without obvious abnormality, atraumatic Neck: no adenopathy, supple, symmetrical, trachea midline and thyroid normal to inspection and palpation Lungs: clear to auscultation bilaterally Breasts: scar of right breast and normal appearance of left breast, no masses or tenderness, No nipple retraction or dimpling, No nipple discharge or bleeding, No axillary adenopathy Heart: regular rate and rhythm Abdomen: soft, non-tender; no masses, no organomegaly Extremities: extremities normal, atraumatic, no cyanosis or edema Skin: skin color, texture, turgor normal. No rashes or lesions Lymph nodes: cervical, supraclavicular, and axillary nodes normal. Neurologic: grossly normal  Pelvic: External genitalia:  no lesions              No abnormal inguinal nodes palpated.              Urethra:  normal appearing urethra with no masses, tenderness or lesions              Bartholins and Skenes: normal                 Vagina: normal appearing vagina with normal color and discharge, no lesions              Cervix: absent              Pap taken: yes Bimanual Exam:  Uterus:  absent.              Adnexa: no mass, fullness,  tenderness              Rectal exam: yes.  Confirms.               Anus:  normal sphincter tone, no lesions  Chaperone was present for exam:  Warren Lacy, CMA  Assessment:   Encounter for breast and pelvic exam.  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  No evidence of recurrence.  Screening for vaginal cancer.  Hx right breast cancer. Status post lumpectomy and radiation therapy.  FH breast cancer.  Caregiver status. Hx renal stones.   Plan: Mammogram screening discussed. Self breast awareness reviewed. Pap and reflex HR HPV collected. Guidelines for Calcium, Vitamin D, regular exercise program including cardiovascular and weight bearing exercise. Follow up in 2 years and prn.   After visit summary provided.   20 min total time was spent for this patient encounter, including preparation, face-to-face counseling with the patient, coordination of care, and documentation of the encounter in addition to doing breast and pelvic exam.

## 2022-09-28 ENCOUNTER — Encounter: Payer: Self-pay | Admitting: Obstetrics and Gynecology

## 2022-09-28 ENCOUNTER — Ambulatory Visit (INDEPENDENT_AMBULATORY_CARE_PROVIDER_SITE_OTHER): Payer: Medicare HMO | Admitting: Obstetrics and Gynecology

## 2022-09-28 VITALS — BP 128/82 | HR 87 | Ht 62.5 in | Wt 164.0 lb

## 2022-09-28 DIAGNOSIS — Z9189 Other specified personal risk factors, not elsewhere classified: Secondary | ICD-10-CM | POA: Diagnosis not present

## 2022-09-28 DIAGNOSIS — Z01419 Encounter for gynecological examination (general) (routine) without abnormal findings: Secondary | ICD-10-CM

## 2022-09-28 DIAGNOSIS — Z853 Personal history of malignant neoplasm of breast: Secondary | ICD-10-CM | POA: Diagnosis not present

## 2022-09-28 DIAGNOSIS — Z1272 Encounter for screening for malignant neoplasm of vagina: Secondary | ICD-10-CM

## 2022-09-28 DIAGNOSIS — D069 Carcinoma in situ of cervix, unspecified: Secondary | ICD-10-CM | POA: Diagnosis not present

## 2022-09-28 NOTE — Patient Instructions (Signed)

## 2022-09-30 LAB — CYTOLOGY - PAP: Adequacy: ABNORMAL

## 2022-10-12 ENCOUNTER — Ambulatory Visit: Payer: Medicare HMO | Attending: General Surgery

## 2022-10-12 VITALS — Wt 166.1 lb

## 2022-10-12 DIAGNOSIS — Z483 Aftercare following surgery for neoplasm: Secondary | ICD-10-CM | POA: Insufficient documentation

## 2022-10-12 NOTE — Therapy (Signed)
OUTPATIENT PHYSICAL THERAPY SOZO SCREENING NOTE   Patient Name: Kelly Beltran MRN: [id number] DOB:Nov 24, 1955, 67 y.o., female Today's Date: 10/12/2022  PCP: Ailene Ravel, MD REFERRING PROVIDER: Almond Lint, MD   PT End of Session - 10/12/22 [phone]     Visit Number 2   # unchanged due to screen only   PT Start Time 0945    PT Stop Time 0952    PT Time Calculation (min) 7 min    Activity Tolerance Patient tolerated treatment well    Behavior During Therapy WFL for tasks assessed/performed             Past Medical History:  Diagnosis Date   Adenocarcinoma of cervix (HCC)    IN SITU   Arthritis    osteo arthritis   Breast CA (HCC)    Right Breast  Cancer   Cancer (HCC)    Diabetes mellitus    TYPE 2   Elevated cholesterol    Family history of bladder cancer    Family history of breast cancer    Family history of colon cancer    Family history of leukemia    Family history of prostate cancer    Fibroid    GERD (gastroesophageal reflux disease)    History of kidney stones    06/12/2019  Passed one 19month ago in her 20's had7 , passed all but 1   Hypertension    PONV (postoperative nausea and vomiting)    woke during lithrosplasty, Epidural with child birth - "itching"   Uterine cancer (HCC)    Past Surgical History:  Procedure Laterality Date   ABDOMINAL HYSTERECTOMY  1997   REPAIR OF CYSTOTOMY   BREAST LUMPECTOMY WITH RADIOACTIVE SEED AND SENTINEL LYMPH NODE BIOPSY Right 06/15/2019   Procedure: RIGHT BREAST LUMPECTOMY WITH RADIOACTIVE SEED AND SENTINEL LYMPH NODE BIOPSY;  Surgeon: Almond Lint, MD;  Location: [facility] OR;  Service: General;  Laterality: Right;  COMBINED WITH REGIONAL FOR POST OP PAIN   CERVIX LESION DESTRUCTION     CHOLECYSTECTOMY     COLPOSCOPY     LITHOTRIPSY     Patient Active Problem List   Diagnosis Date Noted   Coronary artery calcification 06/13/2020   Statin myopathy 06/13/2020   Family history of breast cancer    Family history of  bladder cancer    Family history of colon cancer    Family history of prostate cancer    Family history of leukemia    Malignant neoplasm of upper-outer quadrant of right breast in female, estrogen receptor positive (HCC) 05/18/2019   Menopausal state 03/30/2012   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 It is ER positive, PR negative, and HER2 negative with a Ki67 of 10%. Patient underwent a right lumpectomy and sentinel node biopsy (5 negative nodes) on 06/15/2019.   PRECAUTIONS: right UE Lymphedema risk, None  SUBJECTIVE: Pt returns for her 6 month L-Dex screen.   PAIN:  Are you having pain? No  SOZO SCREENING: Patient was assessed today using the SOZO machine to determine the lymphedema index score. This was compared to her baseline score. It was determined that she is within the recommended range when compared to her baseline and no further action  is needed at this time. She will continue SOZO screenings. These are done every 3 months for 2 years post operatively followed by every 6 months for 2 years, and then annually.   L-DEX FLOWSHEETS - 10/12/22 0900       L-DEX LYMPHEDEMA SCREENING   Measurement Type Unilateral    L-DEX MEASUREMENT EXTREMITY Upper Extremity    POSITION  Standing    DOMINANT SIDE Right    At Risk Side Right    BASELINE SCORE (UNILATERAL) 0.2    L-DEX SCORE (UNILATERAL) 0    VALUE CHANGE (UNILAT) -0.2               Hermenia Bers, PTA 10/12/2022, 9:54 AM

## 2022-10-12 NOTE — Progress Notes (Signed)
GYNECOLOGY  VISIT   HPI: 67 y.o.   Married  Caucasian  female   G2P0011 with No LMP recorded. Patient has had a hysterectomy.   here for   repeat pap.  Status post TAH in 1997 for adenocarcinoma in situ of cervix.   Pap 09/28/22 was non diagnostic due to scant cellularity.  GYNECOLOGIC HISTORY: No LMP recorded. Patient has had a hysterectomy. Contraception:  hyst Menopausal hormone therapy:  n/a Last mammogram:  05/08/22 Breast Density Cat A, BI-RADS CAT 2 benign  Last pap smear:   08/29/20 neg        OB History     Gravida  2   Para  1   Term      Preterm      AB  1   Living  1      SAB  1   IAB      Ectopic      Multiple      Live Births                 Patient Active Problem List   Diagnosis Date Noted   Coronary artery calcification 06/13/2020   Statin myopathy 06/13/2020   Family history of breast cancer    Family history of bladder cancer    Family history of colon cancer    Family history of prostate cancer    Family history of leukemia    Malignant neoplasm of upper-outer quadrant of right breast in female, estrogen receptor positive (HCC) 05/18/2019   Menopausal state 03/30/2012   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/25/2011   Hyperlipidemia    Kidney stone     Past Medical History:  Diagnosis Date   Adenocarcinoma of cervix (HCC)    IN SITU   Arthritis    osteo arthritis   Breast CA (HCC)    Right Breast  Cancer   Cancer (HCC)    Diabetes mellitus    TYPE 2   Elevated cholesterol    Family history of bladder cancer    Family history of breast cancer    Family history of colon cancer    Family history of leukemia    Family history of prostate cancer    Fibroid    GERD (gastroesophageal reflux disease)    History of kidney stones    06/12/2019  Passed one 76month ago in her 20's had7 , passed all but 1   Hypertension    PONV (postoperative nausea and vomiting)    woke during lithrosplasty, Epidural with child birth  - "itching"   Uterine cancer (HCC)     Past Surgical History:  Procedure Laterality Date   ABDOMINAL HYSTERECTOMY  1997   REPAIR OF CYSTOTOMY   BREAST LUMPECTOMY WITH RADIOACTIVE SEED AND SENTINEL LYMPH NODE BIOPSY Right 06/15/2019   Procedure: RIGHT BREAST LUMPECTOMY WITH RADIOACTIVE SEED AND SENTINEL LYMPH NODE BIOPSY;  Surgeon: Almond Lint, MD;  Location: [facility] OR;  Service: General;  Laterality: Right;  COMBINED WITH REGIONAL FOR POST OP PAIN   CERVIX LESION DESTRUCTION     CHOLECYSTECTOMY     COLPOSCOPY     LITHOTRIPSY      Current Outpatient Medications  Medication Sig Dispense Refill   diclofenac Sodium (VOLTAREN) 1 % GEL Apply 1 g topically in the morning and at bedtime.     ezetimibe (ZETIA) 10 MG tablet Take 1 tablet (10 mg total) by mouth daily. 15 tablet 0   famotidine (PEPCID) 20 MG tablet  Take 1 tablet (20 mg total) by mouth 2 (two) times daily.     guaiFENesin (ROBITUSSIN) 100 MG/5ML liquid Take 200 mg by mouth 3 (three) times daily as needed for congestion.     hydrochlorothiazide (HYDRODIURIL) 25 MG tablet Take 25 mg by mouth daily.     ibuprofen (ADVIL) 200 MG tablet Take 400 mg by mouth every 8 (eight) hours as needed (pain.).     JANUMET 50-500 MG tablet Take 1 tablet by mouth 2 (two) times daily.     ketotifen (ZADITOR) 0.025 % ophthalmic solution Place 1 drop into both eyes 3 (three) times daily as needed (allergy eyes.).     lisinopril (PRINIVIL,ZESTRIL) 10 MG tablet Take 10 mg by mouth daily.      loratadine (CLARITIN) 10 MG tablet Take 10 mg by mouth daily.      Probiotic Product (PROBIOTIC PO) Take 1 capsule by mouth daily.     Vitamin D3 (VITAMIN D) 25 MCG tablet Take 1,000 Units by mouth daily.     vitamin E 180 MG (400 UNITS) capsule Take 400 Units by mouth daily.     Zinc 50 MG TABS Take 50 mg by mouth daily.     No current facility-administered medications for this visit.     ALLERGIES: Statins, Lipitor [atorvastatin calcium], Mango flavor, Pravachol,  Sulfa antibiotics, Tetracyclines & related, and Tylenol [acetaminophen]  Family History  Problem Relation Age of Onset   Diabetes Father    Hypertension Father    Prostate cancer Father 39   Diabetes Mother    Hypertension Mother    Breast cancer Mother 59       2nd diagnosis at 45   Atrial fibrillation Mother    Colon cancer Mother 63   Bladder Cancer Mother 69   Stroke Paternal Grandmother    Breast cancer Other        maternal cousin's daughter; dx. in her 46s, double mastectomy, may have had positive genetic test   Leukemia Maternal Aunt 1    Social History   Socioeconomic History   Marital status: Married    Spouse name: Not on file   Number of children: Not on file   Years of education: Not on file   Highest education level: Not on file  Occupational History   Not on file  Tobacco Use   Smoking status: Never   Smokeless tobacco: Never  Vaping Use   Vaping status: Never Used  Substance and Sexual Activity   Alcohol use: Not Currently   Drug use: No   Sexual activity: Yes    Partners: Male    Birth control/protection: Surgical    Comment: 1st intercourse- 73, partners- 3, married- 27 yrs ,hysterectomy  Other Topics Concern   Not on file  Social History Narrative   Not on file   Social Determinants of Health   Financial Resource Strain: Not on file  Food Insecurity: Not on file  Transportation Needs: Not on file  Physical Activity: Not on file  Stress: Not on file  Social Connections: Not on file  Intimate Partner Violence: Not on file    Review of Systems  All other systems reviewed and are negative.   PHYSICAL EXAMINATION:    BP 122/68   Pulse 70   Resp 14     General appearance: alert, cooperative and appears stated age   Pelvic: External genitalia:  no lesions              Urethra:  normal appearing urethra with no masses, tenderness or lesions              Bartholins and Skenes: normal                 Vagina: normal appearing vagina with  normal color and discharge, no lesions              Cervix: absent.  Atrophy noted.                Bimanual Exam:  Uterus:  absent.                Adnexa: no mass, fullness, tenderness           Chaperone was present for exam:  Silas Flood.  ASSESSMENT  Status post TAH. Hx adenocarcinoma in situ of cervix. Unsatisfactory vaginal pap smear.    PLAN  Pap and HR HPV collected from vaginal cuff.  Will reassess pap schedule after this pap has returned.  19 min  total time was spent for this patient encounter, including preparation, face-to-face counseling with the patient, coordination of care, and documentation of the encounter.

## 2022-10-26 ENCOUNTER — Ambulatory Visit (INDEPENDENT_AMBULATORY_CARE_PROVIDER_SITE_OTHER): Payer: Medicare HMO | Admitting: Obstetrics and Gynecology

## 2022-10-26 ENCOUNTER — Encounter: Payer: Self-pay | Admitting: Obstetrics and Gynecology

## 2022-10-26 ENCOUNTER — Other Ambulatory Visit (HOSPITAL_COMMUNITY)
Admission: RE | Admit: 2022-10-26 | Discharge: 2022-10-26 | Disposition: A | Discharge status: Home / Self Care | Payer: Medicare HMO | Source: Ambulatory Visit | Attending: Obstetrics and Gynecology | Admitting: Obstetrics and Gynecology

## 2022-10-26 VITALS — BP 122/68 | HR 70 | Resp 14

## 2022-10-26 DIAGNOSIS — Z1151 Encounter for screening for human papillomavirus (HPV): Secondary | ICD-10-CM | POA: Diagnosis not present

## 2022-10-26 DIAGNOSIS — Z87410 Personal history of cervical dysplasia: Secondary | ICD-10-CM | POA: Insufficient documentation

## 2022-10-26 DIAGNOSIS — R87625 Unsatisfactory cytologic smear of vagina: Secondary | ICD-10-CM

## 2022-10-26 DIAGNOSIS — Z01419 Encounter for gynecological examination (general) (routine) without abnormal findings: Secondary | ICD-10-CM | POA: Diagnosis not present

## 2022-10-29 LAB — CYTOLOGY - PAP
Comment: NEGATIVE
Diagnosis: NEGATIVE
High risk HPV: NEGATIVE

## 2022-11-06 DIAGNOSIS — E1169 Type 2 diabetes mellitus with other specified complication: Secondary | ICD-10-CM | POA: Diagnosis not present

## 2022-11-06 DIAGNOSIS — E782 Mixed hyperlipidemia: Secondary | ICD-10-CM | POA: Diagnosis not present

## 2022-11-06 DIAGNOSIS — G720 Drug-induced myopathy: Secondary | ICD-10-CM | POA: Diagnosis not present

## 2022-11-06 DIAGNOSIS — R911 Solitary pulmonary nodule: Secondary | ICD-10-CM | POA: Diagnosis not present

## 2022-11-06 DIAGNOSIS — T466X5A Adverse effect of antihyperlipidemic and antiarteriosclerotic drugs, initial encounter: Secondary | ICD-10-CM | POA: Diagnosis not present

## 2022-11-06 DIAGNOSIS — Z1331 Encounter for screening for depression: Secondary | ICD-10-CM | POA: Diagnosis not present

## 2022-11-06 DIAGNOSIS — I700 Atherosclerosis of aorta: Secondary | ICD-10-CM | POA: Diagnosis not present

## 2022-11-06 DIAGNOSIS — Z9181 History of falling: Secondary | ICD-10-CM | POA: Diagnosis not present

## 2022-11-06 DIAGNOSIS — K219 Gastro-esophageal reflux disease without esophagitis: Secondary | ICD-10-CM | POA: Diagnosis not present

## 2022-11-27 ENCOUNTER — Ambulatory Visit (HOSPITAL_BASED_OUTPATIENT_CLINIC_OR_DEPARTMENT_OTHER)
Admission: RE | Admit: 2022-11-27 | Discharge: 2022-11-27 | Disposition: A | Discharge status: Home / Self Care | Payer: Medicare HMO | Source: Ambulatory Visit | Attending: Orthopaedic Surgery | Admitting: Orthopaedic Surgery

## 2022-11-27 ENCOUNTER — Ambulatory Visit (HOSPITAL_BASED_OUTPATIENT_CLINIC_OR_DEPARTMENT_OTHER): Payer: Medicare HMO | Admitting: Orthopaedic Surgery

## 2022-11-27 DIAGNOSIS — M7531 Calcific tendinitis of right shoulder: Secondary | ICD-10-CM

## 2022-11-27 DIAGNOSIS — M25511 Pain in right shoulder: Secondary | ICD-10-CM | POA: Insufficient documentation

## 2022-11-27 DIAGNOSIS — M19011 Primary osteoarthritis, right shoulder: Secondary | ICD-10-CM | POA: Diagnosis not present

## 2022-11-27 DIAGNOSIS — G8929 Other chronic pain: Secondary | ICD-10-CM

## 2022-11-27 NOTE — Progress Notes (Signed)
Chief Complaint: Right shoulder pain     History of Present Illness:    Kelly Beltran is a 67 y.o. female presents today with ongoing right shoulder pain for the last several months which has been worsening.  She has status post lumpectomy which occurred 4 years ago.  She is experiencing numbness in the first 3 digits.  She is having difficulty with internal rotation and putting on her bra.  She is experiencing stiffness.  This is awakening her at night.    Surgical History:   None  PMH/PSH/Family History/Social History/Meds/Allergies:    Past Medical History:  Diagnosis Date   Adenocarcinoma of cervix (HCC)    IN SITU   Arthritis    osteo arthritis   Breast CA (HCC)    Right Breast  Cancer   Cancer (HCC)    Diabetes mellitus    TYPE 2   Elevated cholesterol    Family history of bladder cancer    Family history of breast cancer    Family history of colon cancer    Family history of leukemia    Family history of prostate cancer    Fibroid    GERD (gastroesophageal reflux disease)    History of kidney stones    06/12/2019  Passed one 73month ago in her 20's had7 , passed all but 1   Hypertension    PONV (postoperative nausea and vomiting)    woke during lithrosplasty, Epidural with child birth - "itching"   Uterine cancer (HCC)    Past Surgical History:  Procedure Laterality Date   ABDOMINAL HYSTERECTOMY  1997   REPAIR OF CYSTOTOMY   BREAST LUMPECTOMY WITH RADIOACTIVE SEED AND SENTINEL LYMPH NODE BIOPSY Right 06/15/2019   Procedure: RIGHT BREAST LUMPECTOMY WITH RADIOACTIVE SEED AND SENTINEL LYMPH NODE BIOPSY;  Surgeon: Almond Lint, MD;  Location: [facility] OR;  Service: General;  Laterality: Right;  COMBINED WITH REGIONAL FOR POST OP PAIN   CERVIX LESION DESTRUCTION     CHOLECYSTECTOMY     COLPOSCOPY     LITHOTRIPSY     Social History   Socioeconomic History   Marital status: Married    Spouse name: Not on file   Number of  children: Not on file   Years of education: Not on file   Highest education level: Not on file  Occupational History   Not on file  Tobacco Use   Smoking status: Never   Smokeless tobacco: Never  Vaping Use   Vaping status: Never Used  Substance and Sexual Activity   Alcohol use: Not Currently   Drug use: No   Sexual activity: Yes    Partners: Male    Birth control/protection: Surgical    Comment: 1st intercourse- 78, partners- 3, married- 27 yrs ,hysterectomy  Other Topics Concern   Not on file  Social History Narrative   Not on file   Social Determinants of Health   Financial Resource Strain: Not on file  Food Insecurity: Not on file  Transportation Needs: Not on file  Physical Activity: Not on file  Stress: Not on file  Social Connections: Not on file   Family History  Problem Relation Age of Onset   Diabetes Father    Hypertension Father    Prostate cancer Father 76   Diabetes Mother    Hypertension  Mother    Breast cancer Mother 58       2nd diagnosis at 53   Atrial fibrillation Mother    Colon cancer Mother 71   Bladder Cancer Mother 74   Stroke Paternal Grandmother    Breast cancer Other        maternal cousin's daughter; dx. in her 59s, double mastectomy, may have had positive genetic test   Leukemia Maternal Aunt 1   Allergies  Allergen Reactions   Statins Other (See Comments)    Muscle pain and spasms   Lipitor [Atorvastatin Calcium] Other (See Comments)    Muscle aches/fatigue   Mango Flavor     Redness   Pravachol Diarrhea and Other (See Comments)    Muscle aches/fatigue   Sulfa Antibiotics Rash    "ill feeling"   Tetracyclines & Related Palpitations   Tylenol [Acetaminophen] Itching and Rash   Current Outpatient Medications  Medication Sig Dispense Refill   diclofenac Sodium (VOLTAREN) 1 % GEL Apply 1 g topically in the morning and at bedtime.     ezetimibe (ZETIA) 10 MG tablet Take 1 tablet (10 mg total) by mouth daily. 15 tablet 0    famotidine (PEPCID) 20 MG tablet Take 1 tablet (20 mg total) by mouth 2 (two) times daily.     guaiFENesin (ROBITUSSIN) 100 MG/5ML liquid Take 200 mg by mouth 3 (three) times daily as needed for congestion.     hydrochlorothiazide (HYDRODIURIL) 25 MG tablet Take 25 mg by mouth daily.     ibuprofen (ADVIL) 200 MG tablet Take 400 mg by mouth every 8 (eight) hours as needed (pain.).     JANUMET 50-500 MG tablet Take 1 tablet by mouth 2 (two) times daily.     ketotifen (ZADITOR) 0.025 % ophthalmic solution Place 1 drop into both eyes 3 (three) times daily as needed (allergy eyes.).     lisinopril (PRINIVIL,ZESTRIL) 10 MG tablet Take 10 mg by mouth daily.      loratadine (CLARITIN) 10 MG tablet Take 10 mg by mouth daily.      Probiotic Product (PROBIOTIC PO) Take 1 capsule by mouth daily.     Vitamin D3 (VITAMIN D) 25 MCG tablet Take 1,000 Units by mouth daily.     vitamin E 180 MG (400 UNITS) capsule Take 400 Units by mouth daily.     Zinc 50 MG TABS Take 50 mg by mouth daily.     No current facility-administered medications for this visit.   No results found.  Review of Systems:   A ROS was performed including pertinent positives and negatives as documented in the HPI.  Physical Exam :   Constitutional: NAD and appears stated age Neurological: Alert and oriented Psych: Appropriate affect and cooperative There were no vitals taken for this visit.   Comprehensive Musculoskeletal Exam:    Tenderness to palpation about the glenohumeral joint.  Forward elevation is to 170 degrees.  External rotation of the side is to 60 degrees with pain internal rotation is to L1.  There is some pain with forward elevation  Imaging:   Xray (3 views right shoulder): Multiple foci of calcific tendinitis  I personally reviewed and interpreted the radiographs.   Assessment:   67 y.o. female right-hand-dominant with right shoulder rotator cuff calcific tendinitis.  I did discuss that this does typically  feel quite better with ultrasound-guided steroid injection.  I did discuss that we could perform this today.  She would like to proceed with this today.  Plan :    -Right shoulder ultrasound-guided injection performed with verbal consent obtained       I personally saw and evaluated the patient, and participated in the management and treatment plan.  Huel Cote, MD Attending Physician, Orthopedic Surgery  This document was dictated using Dragon voice recognition software. A reasonable attempt at proof reading has been made to minimize errors.

## 2023-03-12 DIAGNOSIS — Z170 Estrogen receptor positive status [ER+]: Secondary | ICD-10-CM | POA: Diagnosis not present

## 2023-03-12 DIAGNOSIS — C50411 Malignant neoplasm of upper-outer quadrant of right female breast: Secondary | ICD-10-CM | POA: Diagnosis not present

## 2023-04-12 ENCOUNTER — Ambulatory Visit: Payer: Medicare HMO

## 2023-04-16 DIAGNOSIS — Z139 Encounter for screening, unspecified: Secondary | ICD-10-CM | POA: Diagnosis not present

## 2023-04-16 DIAGNOSIS — H6992 Unspecified Eustachian tube disorder, left ear: Secondary | ICD-10-CM | POA: Diagnosis not present

## 2023-04-16 DIAGNOSIS — M25472 Effusion, left ankle: Secondary | ICD-10-CM | POA: Diagnosis not present

## 2023-04-28 ENCOUNTER — Ambulatory Visit (HOSPITAL_BASED_OUTPATIENT_CLINIC_OR_DEPARTMENT_OTHER): Admitting: Orthopaedic Surgery

## 2023-04-28 ENCOUNTER — Encounter (HOSPITAL_BASED_OUTPATIENT_CLINIC_OR_DEPARTMENT_OTHER): Payer: Self-pay | Admitting: Orthopaedic Surgery

## 2023-04-28 ENCOUNTER — Ambulatory Visit (HOSPITAL_BASED_OUTPATIENT_CLINIC_OR_DEPARTMENT_OTHER)

## 2023-04-28 DIAGNOSIS — M25462 Effusion, left knee: Secondary | ICD-10-CM | POA: Diagnosis not present

## 2023-04-28 DIAGNOSIS — M25562 Pain in left knee: Secondary | ICD-10-CM | POA: Diagnosis not present

## 2023-04-28 DIAGNOSIS — M7531 Calcific tendinitis of right shoulder: Secondary | ICD-10-CM

## 2023-04-28 DIAGNOSIS — G8929 Other chronic pain: Secondary | ICD-10-CM | POA: Diagnosis not present

## 2023-04-28 DIAGNOSIS — M1712 Unilateral primary osteoarthritis, left knee: Secondary | ICD-10-CM | POA: Diagnosis not present

## 2023-04-28 MED ORDER — TRIAMCINOLONE ACETONIDE 40 MG/ML IJ SUSP
80.0000 mg | INTRAMUSCULAR | Status: AC | PRN
  Administered 2023-04-28: 80 mg via INTRA_ARTICULAR

## 2023-04-28 MED ORDER — LIDOCAINE HCL 1 % IJ SOLN
4.0000 mL | INTRAMUSCULAR | Status: AC | PRN
  Administered 2023-04-28: 4 mL

## 2023-04-28 NOTE — Progress Notes (Signed)
 Marland Kitchen

## 2023-04-28 NOTE — Progress Notes (Signed)
 Chief Complaint: Right shoulder pain     History of Present Illness:   04/28/2023: Presents today for follow-up of the right shoulder as well as the left knee.  She did get extremely good relief from her injection and last year in terms of the right shoulder  Kelly Beltran is a 68 y.o. female presents today with ongoing right shoulder pain for the last several months which has been worsening.  She has status post lumpectomy which occurred 4 years ago.  She is experiencing numbness in the first 3 digits.  She is having difficulty with internal rotation and putting on her bra.  She is experiencing stiffness.  This is awakening her at night.    Surgical History:   None  PMH/PSH/Family History/Social History/Meds/Allergies:    Past Medical History:  Diagnosis Date  . Adenocarcinoma of cervix (HCC)    IN SITU  . Arthritis    osteo arthritis  . Breast CA (HCC)    Right Breast  Cancer  . Cancer (HCC)   . Diabetes mellitus    TYPE 2  . Elevated cholesterol   . Family history of bladder cancer   . Family history of breast cancer   . Family history of colon cancer   . Family history of leukemia   . Family history of prostate cancer   . Fibroid   . GERD (gastroesophageal reflux disease)   . History of kidney stones    06/12/2019  Passed one 17month ago in her 20's had7 , passed all but 1  . Hypertension   . PONV (postoperative nausea and vomiting)    woke during lithrosplasty, Epidural with child birth - "itching"  . Uterine cancer [facility])    Past Surgical History:  Procedure Laterality Date  . ABDOMINAL HYSTERECTOMY  1997   REPAIR OF CYSTOTOMY  . BREAST LUMPECTOMY WITH RADIOACTIVE SEED AND SENTINEL LYMPH NODE BIOPSY Right 06/15/2019   Procedure: RIGHT BREAST LUMPECTOMY WITH RADIOACTIVE SEED AND SENTINEL LYMPH NODE BIOPSY;  Surgeon: Almond Lint, MD;  Location: [facility] OR;  Service: General;  Laterality: Right;  COMBINED WITH REGIONAL FOR POST OP  PAIN  . CERVIX LESION DESTRUCTION    . CHOLECYSTECTOMY    . COLPOSCOPY    . LITHOTRIPSY     Social History   Socioeconomic History  . Marital status: Married    Spouse name: Not on file  . Number of children: Not on file  . Years of education: Not on file  . Highest education level: Not on file  Occupational History  . Not on file  Tobacco Use  . Smoking status: Never  . Smokeless tobacco: Never  Vaping Use  . Vaping status: Never Used  Substance and Sexual Activity  . Alcohol use: Not Currently  . Drug use: No  . Sexual activity: Yes    Partners: Male    Birth control/protection: Surgical    Comment: 1st intercourse- 33, partners- 3, married- 27 yrs ,hysterectomy  Other Topics Concern  . Not on file  Social History Narrative  . Not on file   Social Drivers of Health   Financial Resource Strain: Not on file  Food Insecurity: Not on file  Transportation Needs: Not on file  Physical Activity: Not on file  Stress: Not on file  Social Connections: Not on file  Family History  Problem Relation Age of Onset  . Diabetes Father   . Hypertension Father   . Prostate cancer Father 61  . Diabetes Mother   . Hypertension Mother   . Breast cancer Mother 41       2nd diagnosis at 45  . Atrial fibrillation Mother   . Colon cancer Mother 24  . Bladder Cancer Mother 54  . Stroke Paternal Grandmother   . Breast cancer Other        maternal cousin's daughter; dx. in her 48s, double mastectomy, may have had positive genetic test  . Leukemia Maternal Aunt 1   Allergies  Allergen Reactions  . Statins Other (See Comments)    Muscle pain and spasms  . Lipitor [Atorvastatin Calcium] Other (See Comments)    Muscle aches/fatigue  . Mango Flavoring Agent (Non-Screening)     Redness  . Pravachol Diarrhea and Other (See Comments)    Muscle aches/fatigue  . Sulfa Antibiotics Rash    "ill feeling"  . Tetracyclines & Related Palpitations  . Tylenol [Acetaminophen] Itching and  Rash   Current Outpatient Medications  Medication Sig Dispense Refill  . diclofenac Sodium (VOLTAREN) 1 % GEL Apply 1 g topically in the morning and at bedtime.    Marland Kitchen ezetimibe (ZETIA) 10 MG tablet Take 1 tablet (10 mg total) by mouth daily. 15 tablet 0  . famotidine (PEPCID) 20 MG tablet Take 1 tablet (20 mg total) by mouth 2 (two) times daily.    Marland Kitchen guaiFENesin (ROBITUSSIN) 100 MG/5ML liquid Take 200 mg by mouth 3 (three) times daily as needed for congestion.    . hydrochlorothiazide (HYDRODIURIL) 25 MG tablet Take 25 mg by mouth daily.    Marland Kitchen ibuprofen (ADVIL) 200 MG tablet Take 400 mg by mouth every 8 (eight) hours as needed (pain.).    Marland Kitchen JANUMET 50-500 MG tablet Take 1 tablet by mouth 2 (two) times daily.    Marland Kitchen ketotifen (ZADITOR) 0.025 % ophthalmic solution Place 1 drop into both eyes 3 (three) times daily as needed (allergy eyes.).    Marland Kitchen lisinopril (PRINIVIL,ZESTRIL) 10 MG tablet Take 10 mg by mouth daily.     Marland Kitchen loratadine (CLARITIN) 10 MG tablet Take 10 mg by mouth daily.     . Probiotic Product (PROBIOTIC PO) Take 1 capsule by mouth daily.    . Vitamin D3 (VITAMIN D) 25 MCG tablet Take 1,000 Units by mouth daily.    . vitamin E 180 MG (400 UNITS) capsule Take 400 Units by mouth daily.    . Zinc 50 MG TABS Take 50 mg by mouth daily.     No current facility-administered medications for this visit.   No results found.  Review of Systems:   A ROS was performed including pertinent positives and negatives as documented in the HPI.  Physical Exam :   Constitutional: NAD and appears stated age Neurological: Alert and oriented Psych: Appropriate affect and cooperative There were no vitals taken for this visit.   Comprehensive Musculoskeletal Exam:    Tenderness to palpation about the glenohumeral joint.  Forward elevation is to 170 degrees.  External rotation of the side is to 60 degrees with pain internal rotation is to L1.  There is some pain with forward elevation  Tenderness  palpation about the left medial joint line.  Negative crepitus, and a positive McMurray, negative Lachman, negative posterior drawer and range of motion is from 0 to 130 degrees  Imaging:   Xray (3 views right  shoulder): Multiple foci of calcific tendinitis  I personally reviewed and interpreted the radiographs.   Assessment:   68 y.o. female right-hand-dominant with right shoulder rotator cuff calcific tendinitis.  Also have evidence of left knee medial compartment osteoarthritis.  At this time she would like to pursue injections of both of these.  Will plan to proceed with these and I will see her back as needed Plan :    -Right shoulder and left knee ultrasound-guided injections provided after verbal consent obtained    Procedure Note  Patient: ANNABELLE REXROAD             Date of Birth: [birth year]           MRN: [id number]             Visit Date: 04/28/2023  Procedures: Visit Diagnoses:  1. Chronic pain of left knee     Large Joint Inj: R subacromial bursa on 04/28/2023 5:03 PM Indications: pain Details: 22 G 1.5 in needle, ultrasound-guided anterior approach  Arthrogram: No  Medications: 4 mL lidocaine 1 %; 80 mg triamcinolone acetonide 40 MG/ML Outcome: tolerated well, no immediate complications Procedure, treatment alternatives, risks and benefits explained, specific risks discussed. Consent was given by the patient. Immediately prior to procedure a time out was called to verify the correct patient, procedure, equipment, support staff and site/side marked as required. Patient was prepped and draped in the usual sterile fashion.    Large Joint Inj: L knee on 04/28/2023 5:04 PM Indications: pain Details: 22 G 1.5 in needle, ultrasound-guided anterior approach  Arthrogram: No  Medications: 4 mL lidocaine 1 %; 80 mg triamcinolone acetonide 40 MG/ML Outcome: tolerated well, no immediate complications Procedure, treatment alternatives, risks and benefits explained,  specific risks discussed. Consent was given by the patient. Immediately prior to procedure a time out was called to verify the correct patient, procedure, equipment, support staff and site/side marked as required. Patient was prepped and draped in the usual sterile fashion.            I personally saw and evaluated the patient, and participated in the management and treatment plan.  Huel Cote, MD Attending Physician, Orthopedic Surgery  This document was dictated using Dragon voice recognition software. A reasonable attempt at proof reading has been made to minimize errors.

## 2023-05-10 DIAGNOSIS — E1169 Type 2 diabetes mellitus with other specified complication: Secondary | ICD-10-CM | POA: Diagnosis not present

## 2023-05-10 DIAGNOSIS — G720 Drug-induced myopathy: Secondary | ICD-10-CM | POA: Diagnosis not present

## 2023-05-10 DIAGNOSIS — R911 Solitary pulmonary nodule: Secondary | ICD-10-CM | POA: Diagnosis not present

## 2023-05-10 DIAGNOSIS — T466X5A Adverse effect of antihyperlipidemic and antiarteriosclerotic drugs, initial encounter: Secondary | ICD-10-CM | POA: Diagnosis not present

## 2023-05-10 DIAGNOSIS — Z79899 Other long term (current) drug therapy: Secondary | ICD-10-CM | POA: Diagnosis not present

## 2023-05-10 DIAGNOSIS — E782 Mixed hyperlipidemia: Secondary | ICD-10-CM | POA: Diagnosis not present

## 2023-05-10 DIAGNOSIS — K219 Gastro-esophageal reflux disease without esophagitis: Secondary | ICD-10-CM | POA: Diagnosis not present

## 2023-05-11 DIAGNOSIS — Z1231 Encounter for screening mammogram for malignant neoplasm of breast: Secondary | ICD-10-CM | POA: Diagnosis not present

## 2023-05-12 ENCOUNTER — Encounter: Payer: Self-pay | Admitting: Obstetrics and Gynecology

## 2023-05-19 ENCOUNTER — Encounter: Payer: Self-pay | Admitting: Obstetrics and Gynecology

## 2023-05-19 DIAGNOSIS — R922 Inconclusive mammogram: Secondary | ICD-10-CM | POA: Diagnosis not present

## 2023-05-19 DIAGNOSIS — N6311 Unspecified lump in the right breast, upper outer quadrant: Secondary | ICD-10-CM | POA: Diagnosis not present

## 2023-05-31 ENCOUNTER — Encounter (INDEPENDENT_AMBULATORY_CARE_PROVIDER_SITE_OTHER): Payer: Self-pay | Admitting: Otolaryngology

## 2023-05-31 ENCOUNTER — Telehealth: Payer: Self-pay | Admitting: *Deleted

## 2023-05-31 NOTE — Telephone Encounter (Signed)
 Spoke with patient about unread Mychart message regarding breast imaging.   Patient requested to schedule next exam. Patient is medicare, screening questions reviewed, patient answered yes to >5 partners in a lifetime.   Last B&P 09/28/22  Patient asked that you review her Hx and advise on next visit.

## 2023-06-02 ENCOUNTER — Encounter: Payer: Self-pay | Admitting: Obstetrics and Gynecology

## 2023-06-02 NOTE — Telephone Encounter (Signed)
 Call placed to patient, left detailed message, ok per dpr. Advised per Dr. Colvin Dec. Return call to office to schedule next exam, [phone], opt 1 for appt; opt 4 if any additional questions.    See MMG results for updated [facility] imaging 05/31/23.   Encounter closed.

## 2023-06-02 NOTE — Telephone Encounter (Signed)
 It will be patient preference if she would like to do yearly or every other year visits for her breast and pelvic exam.   Her pap can be done every 2 years and high risk HPV testing every 5 years.   She is still under the care of her oncology team for follow up of breast cancer.   Please also see result note regarding her current breast imaging and recommendation from [facility] for a right breast biopsy.

## 2023-06-07 ENCOUNTER — Encounter (INDEPENDENT_AMBULATORY_CARE_PROVIDER_SITE_OTHER): Payer: Self-pay

## 2023-06-15 ENCOUNTER — Ambulatory Visit (INDEPENDENT_AMBULATORY_CARE_PROVIDER_SITE_OTHER): Admitting: Audiology

## 2023-06-15 ENCOUNTER — Encounter (INDEPENDENT_AMBULATORY_CARE_PROVIDER_SITE_OTHER): Payer: Self-pay | Admitting: Otolaryngology

## 2023-06-15 ENCOUNTER — Ambulatory Visit (INDEPENDENT_AMBULATORY_CARE_PROVIDER_SITE_OTHER): Admitting: Otolaryngology

## 2023-06-15 VITALS — BP 130/85 | HR 90 | Ht 62.0 in | Wt 161.0 lb

## 2023-06-15 DIAGNOSIS — H903 Sensorineural hearing loss, bilateral: Secondary | ICD-10-CM | POA: Diagnosis not present

## 2023-06-15 DIAGNOSIS — H9012 Conductive hearing loss, unilateral, left ear, with unrestricted hearing on the contralateral side: Secondary | ICD-10-CM

## 2023-06-15 DIAGNOSIS — H6982 Other specified disorders of Eustachian tube, left ear: Secondary | ICD-10-CM

## 2023-06-15 NOTE — Progress Notes (Unsigned)
  [address], [address] [place], [place] [address] [phone]  Audiological Evaluation    Name: Kelly Beltran     DOB:   Jul 24, 1955      MRN:   [id number]                                                                                     Service Date: 06/15/2023     Accompanied by: ***   Patient comes today after Dr. Darlin Ehrlich, ENT sent a referral for a hearing evaluation due to concerns with sudden hearing loss.   Symptoms Yes Details  Hearing loss  []    Tinnitus  []    Ear pain/ infections/pressure  []    Balance problems  []    Noise exposure history  []    Previous ear surgeries  []    Family history of hearing loss  []    Amplification  []    Other  []      Otoscopy: Right ear: {otoscopy:31227} Left ear:  {otoscopy:31227}  Tympanometry: Right ear: Type A- Normal external ear canal volume with normal middle ear pressure and tympanic membrane compliance. Left ear: Type As- Normal external ear canal volume with normal middle ear pressure and low tympanic membrane compliance.  Pure tone Audiometry: Right ear- *** {hearing loss types:31372::"sensorineural hearing loss"} from *** Hz - *** Hz. Left ear-  *** {hearing loss types:31372::"sensorineural hearing loss"} from *** Hz - *** Hz.  Speech Audiometry: Right ear- Speech Reception Threshold (SRT) was obtained at *** dBHL. Left ear-Speech Reception Threshold (SRT) was obtained at *** dBHL.   Word Recognition Score Tested using {word lists:31376::"NU-6 (MLV)"} Right ear: ***% was obtained at a presentation level of *** dBHL with contralateral masking which is deemed as  {word recognition score:31373}. Left ear: ***% was obtained at a presentation level of *** dBHL with contralateral masking which is deemed as  {word recognition score:31373}.   The hearing test results were completed under headphones and re-checked with inserts and results are deemed to be of {test reliability:31390::"good reliability"}. Test technique:   {audiometric test technique:31400::"conventional"}    Impression: {Word recognition Score interpretation:31432::"There is not a significant difference in pure-tone thresholds between ears.","There is not a significant difference in the word recognition score in between ears. "}   Recommendations: {Audiology Recommendations:31370::"Follow up with ENT as scheduled for today."}   Homero Hyson MARIE LEROUX-MARTINEZ, AUD

## 2023-06-16 ENCOUNTER — Ambulatory Visit: Payer: Self-pay | Admitting: *Deleted

## 2023-06-17 DIAGNOSIS — H6982 Other specified disorders of Eustachian tube, left ear: Secondary | ICD-10-CM | POA: Insufficient documentation

## 2023-06-17 DIAGNOSIS — H903 Sensorineural hearing loss, bilateral: Secondary | ICD-10-CM | POA: Insufficient documentation

## 2023-06-17 NOTE — Progress Notes (Signed)
 CC: Left ear hearing loss, clogging sensation in the left ear  HPI:  Kelly Beltran is a 68 y.o. female who presents today complaining of decreased left ear hearing and clogging sensation in the left ear 2 months ago.  She was seen by her primary care physician, and was noted to have left tympanic membrane retraction.  She was treated with Flonase nasal spray, with improvement in her hearing.  However, she is still experiencing occasional left ear discomfort.  She denies any significant otalgia, otorrhea, or vertigo.  She denies any recent otitis media or otitis externa.  She has no previous ENT surgery.  Past Medical History:  Diagnosis Date   Adenocarcinoma of cervix (HCC)    IN SITU   Arthritis    osteo arthritis   Breast CA (HCC)    Right Breast  Cancer   Cancer (HCC)    Diabetes mellitus    TYPE 2   Elevated cholesterol    Family history of bladder cancer    Family history of breast cancer    Family history of colon cancer    Family history of leukemia    Family history of prostate cancer    Fibroid    GERD (gastroesophageal reflux disease)    History of kidney stones    06/12/2019  Passed one 24month ago in her 20's had7 , passed all but 1   Hypertension    PONV (postoperative nausea and vomiting)    woke during lithrosplasty, Epidural with child birth - "itching"   Uterine cancer (HCC)     Past Surgical History:  Procedure Laterality Date   ABDOMINAL HYSTERECTOMY  1997   REPAIR OF CYSTOTOMY   BREAST LUMPECTOMY WITH RADIOACTIVE SEED AND SENTINEL LYMPH NODE BIOPSY Right 06/15/2019   Procedure: RIGHT BREAST LUMPECTOMY WITH RADIOACTIVE SEED AND SENTINEL LYMPH NODE BIOPSY;  Surgeon: Lockie Rima, MD;  Location: [facility] OR;  Service: General;  Laterality: Right;  COMBINED WITH REGIONAL FOR POST OP PAIN   CERVIX LESION DESTRUCTION     CHOLECYSTECTOMY     COLPOSCOPY     LITHOTRIPSY      Family History  Problem Relation Age of Onset   Diabetes Father    Hypertension Father     Prostate cancer Father 87   Diabetes Mother    Hypertension Mother    Breast cancer Mother 40       2nd diagnosis at 18   Atrial fibrillation Mother    Colon cancer Mother 66   Bladder Cancer Mother 62   Stroke Paternal Grandmother    Breast cancer Other        maternal cousin's daughter; dx. in her 65s, double mastectomy, may have had positive genetic test   Leukemia Maternal Aunt 1    Social History:  reports that she has never smoked. She has never used smokeless tobacco. She reports that she does not currently use alcohol. She reports that she does not use drugs.  Allergies:  Allergies  Allergen Reactions   Statins Other (See Comments)    Muscle pain and spasms   Lipitor [Atorvastatin Calcium] Other (See Comments)    Muscle aches/fatigue   Mango Flavoring Agent (Non-Screening)     Redness   Pravachol Diarrhea and Other (See Comments)    Muscle aches/fatigue   Sulfa Antibiotics Rash    "ill feeling"   Tetracyclines & Related Palpitations   Tylenol [Acetaminophen] Itching and Rash    Prior to Admission medications   Medication Sig Start  Date End Date Taking? Authorizing Provider  diclofenac Sodium (VOLTAREN) 1 % GEL Apply 1 g topically in the morning and at bedtime.   Yes [provider]  ezetimibe  (ZETIA ) 10 MG tablet Take 1 tablet (10 mg total) by mouth daily. 07/01/21  Yes Sonny Dust, MD  famotidine  (PEPCID ) 20 MG tablet Take 1 tablet (20 mg total) by mouth 2 (two) times daily. 07/30/21  Yes Gudena, Vinay, MD  fluticasone (FLONASE) 50 MCG/ACT nasal spray Place 2 sprays into both nostrils daily. 04/16/23  Yes [provider]  guaiFENesin (ROBITUSSIN) 100 MG/5ML liquid Take 200 mg by mouth 3 (three) times daily as needed for congestion.   Yes [provider]  hydrochlorothiazide (HYDRODIURIL) 25 MG tablet Take 25 mg by mouth daily.   Yes [provider]  ibuprofen (ADVIL) 200 MG tablet Take 400 mg by mouth every 8 (eight) hours as  needed (pain.).   Yes [provider]  JANUMET 50-500 MG tablet Take 1 tablet by mouth 2 (two) times daily. 04/26/20  Yes [provider]  ketotifen (ZADITOR) 0.025 % ophthalmic solution Place 1 drop into both eyes 3 (three) times daily as needed (allergy eyes.).   Yes [provider]  lisinopril (PRINIVIL,ZESTRIL) 10 MG tablet Take 10 mg by mouth daily.    Yes [provider]  loratadine (CLARITIN) 10 MG tablet Take 10 mg by mouth daily.    Yes [provider]  Probiotic Product (PROBIOTIC PO) Take 1 capsule by mouth daily.   Yes [provider]  Vitamin D3 (VITAMIN D) 25 MCG tablet Take 1,000 Units by mouth daily.   Yes [provider]  vitamin E 180 MG (400 UNITS) capsule Take 400 Units by mouth daily.   Yes [provider]  Zinc 50 MG TABS Take 50 mg by mouth daily.   Yes [provider]    Blood pressure 130/85, pulse 90, height 5\' 2"  (1.575 m), weight 161 lb (73 kg), SpO2 95%. Exam: General: Communicates without difficulty, well nourished, no acute distress. Head: Normocephalic, no evidence injury, no tenderness, facial buttresses intact without stepoff. Face/sinus: No tenderness to palpation and percussion. Facial movement is normal and symmetric. Eyes: PERRL, EOMI. No scleral icterus, conjunctivae clear. Neuro: CN II exam reveals vision grossly intact.  No nystagmus at any point of gaze. Ears: Auricles well formed without lesions.  Ear canals are intact without mass or lesion.  No erythema or edema is appreciated.  The TMs are intact without fluid. Nose: External evaluation reveals normal support and skin without lesions.  Dorsum is intact.  Anterior rhinoscopy reveals congested mucosa over anterior aspect of inferior turbinates and intact septum.  No purulence noted. Oral:  Oral cavity and oropharynx are intact, symmetric, without erythema or edema.  Mucosa is moist without lesions. Neck: Full range of motion  without pain.  There is no significant lymphadenopathy.  No masses palpable.  Thyroid  bed within normal limits to palpation.  Parotid glands and submandibular glands equal bilaterally without mass.  Trachea is midline. Neuro:  CN 2-12 grossly intact.   Her hearing test shows bilateral high-frequency sensorineural hearing loss, with minimal left ear conductive hearing loss component.  Assessment: 1.  The patient's history and physical exam findings are consistent with left ear eustachian tube dysfunction. 2.  Her ear canals, tympanic membranes, and middle ear spaces are otherwise normal. 3.  Bilateral high-frequency sensorineural hearing loss, with minimal left ear conductive hearing loss component.  Plan: 1.  The  physical exam findings and the hearing test results are reviewed with the patient. 2.  Flonase nasal spray 2 sprays each nostril daily.  The importance of consistent daily use is discussed. 3.  Valsalva exercise multiple times a day. 4.  The patient is reassured that no significant otologic abnormalities noted today. 5.  The patient will return for reevaluation in 1 month.  Tabithia Stroder W Tawnia Schirm 06/17/2023, 8:48 AM

## 2023-06-21 ENCOUNTER — Ambulatory Visit: Payer: Medicare HMO | Attending: General Surgery

## 2023-06-21 ENCOUNTER — Encounter: Payer: Self-pay | Admitting: Audiology

## 2023-06-21 VITALS — Wt 162.5 lb

## 2023-06-21 DIAGNOSIS — Z483 Aftercare following surgery for neoplasm: Secondary | ICD-10-CM | POA: Insufficient documentation

## 2023-06-21 NOTE — Therapy (Signed)
 OUTPATIENT PHYSICAL THERAPY SOZO SCREENING NOTE   Patient Name: Kelly Beltran MRN: [id number] DOB:May 23, 1955, 68 y.o., female Today's Date: 06/21/2023  PCP: Annette Barters, MD REFERRING PROVIDER: Lockie Rima, MD   PT End of Session - 06/21/23 0813     Visit Number 2   # unchanged due to screen only   PT Start Time 0812    PT Stop Time 0817    PT Time Calculation (min) 5 min    Activity Tolerance Patient tolerated treatment well    Behavior During Therapy [facility] for tasks assessed/performed             Past Medical History:  Diagnosis Date   Adenocarcinoma of cervix (HCC)    IN SITU   Arthritis    osteo arthritis   Breast CA (HCC)    Right Breast  Cancer   Cancer (HCC)    Diabetes mellitus    TYPE 2   Elevated cholesterol    Family history of bladder cancer    Family history of breast cancer    Family history of colon cancer    Family history of leukemia    Family history of prostate cancer    Fibroid    GERD (gastroesophageal reflux disease)    History of kidney stones    06/12/2019  Passed one 66month ago in her 20's had7 , passed all but 1   Hypertension    PONV (postoperative nausea and vomiting)    woke during lithrosplasty, Epidural with child birth - "itching"   Uterine cancer (HCC)    Past Surgical History:  Procedure Laterality Date   ABDOMINAL HYSTERECTOMY  1997   REPAIR OF CYSTOTOMY   BREAST LUMPECTOMY WITH RADIOACTIVE SEED AND SENTINEL LYMPH NODE BIOPSY Right 06/15/2019   Procedure: RIGHT BREAST LUMPECTOMY WITH RADIOACTIVE SEED AND SENTINEL LYMPH NODE BIOPSY;  Surgeon: Lockie Rima, MD;  Location: [facility] OR;  Service: General;  Laterality: Right;  COMBINED WITH REGIONAL FOR POST OP PAIN   CERVIX LESION DESTRUCTION     CHOLECYSTECTOMY     COLPOSCOPY     LITHOTRIPSY     Patient Active Problem List   Diagnosis Date Noted   Other specified disorders of eustachian tube, left ear 06/17/2023   Sensorineural hearing loss, bilateral 06/17/2023    Coronary artery calcification 06/13/2020   Statin myopathy 06/13/2020   Family history of breast cancer    Family history of bladder cancer    Family history of colon cancer    Family history of prostate cancer    Family history of leukemia    Malignant neoplasm of upper-outer quadrant of right breast in female, estrogen receptor positive (HCC) 05/18/2019   Menopausal state 03/30/2012   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 It is ER positive, PR negative, and HER2 negative with a Ki67 of 10%. Patient underwent a right lumpectomy and sentinel node biopsy (5 negative nodes) on 06/15/2019.   PRECAUTIONS: right UE Lymphedema risk, None  SUBJECTIVE: Pt returns for her last 6 month L-Dex screen.   PAIN:  Are you having pain? No  SOZO SCREENING: Patient was assessed today using the SOZO machine to determine the lymphedema index score. This was compared to her baseline score.  It was determined that she is within the recommended range when compared to her baseline and no further action is needed at this time. She will continue SOZO screenings. These are done every 3 months for 2 years post operatively followed by every 6 months for 2 years, and then annually.   L-DEX FLOWSHEETS - 06/21/23 0800       L-DEX LYMPHEDEMA SCREENING   Measurement Type Unilateral    L-DEX MEASUREMENT EXTREMITY Upper Extremity    POSITION  Standing    DOMINANT SIDE Right    At Risk Side Right    BASELINE SCORE (UNILATERAL) 0.2    L-DEX SCORE (UNILATERAL) 1.2    VALUE CHANGE (UNILAT) 1            P: Pt to transition to annual screens.    Denyce Flank, PTA 06/21/2023, 8:15 AM

## 2023-07-12 ENCOUNTER — Ambulatory Visit: Payer: Self-pay | Admitting: Obstetrics and Gynecology

## 2023-07-20 ENCOUNTER — Ambulatory Visit (INDEPENDENT_AMBULATORY_CARE_PROVIDER_SITE_OTHER): Admitting: Otolaryngology

## 2023-07-26 DIAGNOSIS — H02839 Dermatochalasis of unspecified eye, unspecified eyelid: Secondary | ICD-10-CM | POA: Diagnosis not present

## 2023-07-26 DIAGNOSIS — Z7984 Long term (current) use of oral hypoglycemic drugs: Secondary | ICD-10-CM | POA: Diagnosis not present

## 2023-07-26 DIAGNOSIS — E119 Type 2 diabetes mellitus without complications: Secondary | ICD-10-CM | POA: Diagnosis not present

## 2023-07-26 DIAGNOSIS — H2513 Age-related nuclear cataract, bilateral: Secondary | ICD-10-CM | POA: Diagnosis not present

## 2023-07-26 DIAGNOSIS — H04123 Dry eye syndrome of bilateral lacrimal glands: Secondary | ICD-10-CM | POA: Diagnosis not present

## 2023-07-26 DIAGNOSIS — H524 Presbyopia: Secondary | ICD-10-CM | POA: Diagnosis not present

## 2023-08-02 ENCOUNTER — Ambulatory Visit (INDEPENDENT_AMBULATORY_CARE_PROVIDER_SITE_OTHER): Admitting: Otolaryngology

## 2023-08-02 ENCOUNTER — Encounter (INDEPENDENT_AMBULATORY_CARE_PROVIDER_SITE_OTHER): Payer: Self-pay | Admitting: Otolaryngology

## 2023-08-02 VITALS — BP 126/81 | HR 104

## 2023-08-02 DIAGNOSIS — H903 Sensorineural hearing loss, bilateral: Secondary | ICD-10-CM

## 2023-08-02 DIAGNOSIS — H6982 Other specified disorders of Eustachian tube, left ear: Secondary | ICD-10-CM

## 2023-08-02 NOTE — Assessment & Plan Note (Signed)
 06/15/2019: Right lumpectomy: Grade 2 IDC 0.8 cm, intermediate grade DCIS, margins negative, 0/5 lymph nodes negative, ER 100%, PR 0%, Ki-67 10%, HER-2 equivocal by IHC, FISH negative ratio 1.44 T1BN0 stage Ia   Adjuvant radiation 08/18/2019- 09/08/2019   Treatment plan:  She could not tolerate anastrozole  and therefore we discontinued it.    Breast Cancer Surveillance:  1. Mammogram 05/31/2023: Benign, density cat A 2. Breast Exam: 08/03/2023: Benign   RTC in 1 year

## 2023-08-03 ENCOUNTER — Inpatient Hospital Stay: Payer: PPO | Attending: Hematology and Oncology | Admitting: Hematology and Oncology

## 2023-08-03 VITALS — BP 132/70 | HR 75 | Temp 97.8°F | Resp 16 | Ht 62.0 in | Wt 165.6 lb

## 2023-08-03 DIAGNOSIS — Z923 Personal history of irradiation: Secondary | ICD-10-CM | POA: Diagnosis not present

## 2023-08-03 DIAGNOSIS — Z8542 Personal history of malignant neoplasm of other parts of uterus: Secondary | ICD-10-CM | POA: Diagnosis not present

## 2023-08-03 DIAGNOSIS — C50411 Malignant neoplasm of upper-outer quadrant of right female breast: Secondary | ICD-10-CM | POA: Insufficient documentation

## 2023-08-03 DIAGNOSIS — Z1732 Human epidermal growth factor receptor 2 negative status: Secondary | ICD-10-CM | POA: Insufficient documentation

## 2023-08-03 DIAGNOSIS — Z1722 Progesterone receptor negative status: Secondary | ICD-10-CM | POA: Diagnosis not present

## 2023-08-03 DIAGNOSIS — Z79811 Long term (current) use of aromatase inhibitors: Secondary | ICD-10-CM | POA: Insufficient documentation

## 2023-08-03 DIAGNOSIS — Z170 Estrogen receptor positive status [ER+]: Secondary | ICD-10-CM | POA: Diagnosis not present

## 2023-08-03 NOTE — Progress Notes (Signed)
 Patient ID: Kelly Beltran, female   DOB: 06-22-1955, 68 y.o.   MRN: [id number]  Follow-up: Left eustachian tube dysfunction, bilateral hearing loss  HPI: The patient is a 67 year old female who returns today for follow-up evaluation.  She was last seen 1 month ago.  At that time, she was complaining of clogging sensation in her left ear.  She was diagnosed with left ear eustachian tube dysfunction.  She was treated with Flonase nasal spray and Valsalva exercise.  She also has a history of bilateral high-frequency sensorineural hearing loss.  The patient returns today reporting resolution of her ear symptoms.  She denies any recent change in her hearing.  She is able to auto insufflate her middle ear spaces with the Valsalva exercise.  Exam: General: Communicates without difficulty, well nourished, no acute distress. Head: Normocephalic, no evidence injury, no tenderness, facial buttresses intact without stepoff. Face/sinus: No tenderness to palpation and percussion. Facial movement is normal and symmetric. Eyes: PERRL, EOMI. No scleral icterus, conjunctivae clear. Neuro: CN II exam reveals vision grossly intact.  No nystagmus at any point of gaze. Ears: Auricles well formed without lesions.  Ear canals are intact without mass or lesion.  No erythema or edema is appreciated.  The TMs are intact without fluid. Nose: External evaluation reveals normal support and skin without lesions.  Dorsum is intact.  Anterior rhinoscopy reveals congested mucosa over anterior aspect of inferior turbinates and intact septum.  No purulence noted. Oral:  Oral cavity and oropharynx are intact, symmetric, without erythema or edema.  Mucosa is moist without lesions. Neck: Full range of motion without pain.  There is no significant lymphadenopathy.  No masses palpable.  Thyroid  bed within normal limits to palpation.  Parotid glands and submandibular glands equal bilaterally without mass.  Trachea is midline. Neuro:  CN 2-12 grossly  intact.   Assessment: 1.  Clinically improved left eustachian tube dysfunction. 2.  Her ear canals, tympanic membranes, and middle ear spaces are all normal. 3.  Subjectively stable bilateral high-frequency sensorineural hearing loss.  Plan: 1.  The physical exam findings are reviewed with the patient. 2.  Continue with Flonase and Valsalva exercise as needed. 3.  The patient is encouraged to call with any questions or concerns.

## 2023-08-03 NOTE — Progress Notes (Signed)
 Patient Care Team: Hamrick, Charlene CROME, MD as PCP - General (Family Medicine) Aron Shoulders, MD as Consulting Physician (General Surgery) Odean Potts, MD as Consulting Physician (Hematology and Oncology) Shannon Agent, MD as Consulting Physician (Radiation Oncology)  DIAGNOSIS:  Encounter Diagnosis  Name Primary?   Malignant neoplasm of upper-outer quadrant of right breast in female, estrogen receptor positive (HCC) Yes    SUMMARY OF ONCOLOGIC HISTORY: Oncology History  Malignant neoplasm of upper-outer quadrant of right breast in female, estrogen receptor positive (HCC)  05/18/2019 Initial Diagnosis   Screening mammogram detected 0.6cm mass in the right breast, 10 o'clock position. 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   05/24/2019 Cancer Staging   Staging form: Breast, AJCC 8th Edition - Clinical stage from 05/24/2019: Stage IA (cT1b, cN0, cM0, G2, ER+, PR-, HER2-)   06/15/2019 Surgery   Right lumpectomy [facility]) ([id number]): IDC, grade 2, 0.8cm, with intermediate grade DCIS, clear margins, 5 right axillary lymph nodes negative.   07/05/2019 Cancer Staging   Staging form: Breast, AJCC 8th Edition - Pathologic: Stage IA (pT1b, pN0, cM0, G2, ER+, PR-, HER2-, Oncotype DX score: 24)   08/17/2019 - 09/14/2019 Radiation Therapy   The patient initially received a dose of 40.05 Gy in 15 fractions to the breast using whole-breast tangent fields. This was delivered using a 3-D conformal technique. The pt received a boost delivering an additional 12 Gy in 6 fractions using a electron boost with electrons. The total dose was 52.05 Gy.   09/2019 -  Anti-estrogen oral therapy   Anastrozole      CHIEF COMPLIANT: Cervical breast cancer  HISTORY OF PRESENT ILLNESS:  History of Present Illness Kelly Beltran is a 68 year old female with a history of breast cancer who presents for follow-up after recent  mammogram and ultrasound.  A recent mammogram and ultrasound indicated findings of fat or scar tissue, leading to a recommendation for a biopsy. She recalls a similar experience five years ago, which was distressing, but was relieved when informed that her presence was not necessary after the biopsy.  She has no current pain, fatigue, or anxiety. She experienced soreness after washing the outside of her house recently. Her medical history includes breast cancer with surgery in May 2021, and previous cancer in her uterus and cervix, which was removed.     ALLERGIES:  is allergic to statins, amlodipine besylate, ezetimibe , lipitor [atorvastatin calcium], lisinopril, losartan potassium, mango flavoring agent (non-screening), pravachol, spironolactone, sulfa antibiotics, tetracyclines & related, and tylenol [acetaminophen].  MEDICATIONS:  Current Outpatient Medications  Medication Sig Dispense Refill   diclofenac Sodium (VOLTAREN) 1 % GEL Apply 1 g topically in the morning and at bedtime.     ezetimibe  (ZETIA ) 10 MG tablet Take 1 tablet (10 mg total) by mouth daily. 15 tablet 0   famotidine  (PEPCID ) 20 MG tablet Take 1 tablet (20 mg total) by mouth 2 (two) times daily.     fluticasone (FLONASE) 50 MCG/ACT nasal spray Place 2 sprays into both nostrils daily.     guaiFENesin (ROBITUSSIN) 100 MG/5ML liquid Take 200 mg by mouth 3 (three) times daily as needed for congestion.     hydrochlorothiazide (HYDRODIURIL) 25 MG tablet Take 25 mg by mouth daily.     ibuprofen (ADVIL) 200 MG tablet Take 400 mg by mouth every 8 (eight) hours as needed (pain.).     JANUMET 50-500 MG tablet Take 1 tablet by  mouth 2 (two) times daily.     ketotifen (ZADITOR) 0.025 % ophthalmic solution Place 1 drop into both eyes 3 (three) times daily as needed (allergy eyes.).     lisinopril (PRINIVIL,ZESTRIL) 10 MG tablet Take 10 mg by mouth daily.      Probiotic Product (PROBIOTIC PO) Take 1 capsule by mouth daily.     Vitamin  D3 (VITAMIN D) 25 MCG tablet Take 1,000 Units by mouth daily.     vitamin E 180 MG (400 UNITS) capsule Take 400 Units by mouth daily.     Zinc 50 MG TABS Take 50 mg by mouth daily.     No current facility-administered medications for this visit.    PHYSICAL EXAMINATION: ECOG PERFORMANCE STATUS: 1 - Symptomatic but completely ambulatory  Vitals:   08/03/23 1016  BP: 132/70  Pulse: 75  Resp: 16  Temp: 97.8 F (36.6 C)  SpO2: 100%   Filed Weights   08/03/23 1016  Weight: 165 lb 9.6 oz (75.1 kg)    Physical Exam No palpable lumps or nodules in bilateral breasts or axilla  (exam performed in the presence of a chaperone)  LABORATORY DATA:  I have reviewed the data as listed    Latest Ref Rng & Units 06/12/2019   11:53 AM 05/24/2019    8:22 AM  CMP  Glucose 70 - 99 mg/dL 829  798   BUN 8 - 23 mg/dL 19  15   Creatinine 9.55 - 1.00 mg/dL 9.15  9.04   Sodium 864 - 145 mmol/L 139  140   Potassium 3.5 - 5.1 mmol/L 3.5  3.4   Chloride 98 - 111 mmol/L 101  100   CO2 22 - 32 mmol/L 27  26   Calcium 8.9 - 10.3 mg/dL 89.9  89.8   Total Protein 6.5 - 8.1 g/dL  7.2   Total Bilirubin 0.3 - 1.2 mg/dL  0.4   Alkaline Phos 38 - 126 U/L  48   AST 15 - 41 U/L  18   ALT 0 - 44 U/L  12     Lab Results  Component Value Date   WBC 7.6 06/12/2019   HGB 14.1 06/12/2019   HCT 43.7 06/12/2019   MCV 86.0 06/12/2019   PLT 285 06/12/2019   NEUTROABS 3.5 05/24/2019    ASSESSMENT & PLAN:  Malignant neoplasm of upper-outer quadrant of right breast in female, estrogen receptor positive (HCC) 06/15/2019: Right lumpectomy: Grade 2 IDC 0.8 cm, intermediate grade DCIS, margins negative, 0/5 lymph nodes negative, ER 100%, PR 0%, Ki-67 10%, HER-2 equivocal by IHC, FISH negative ratio 1.44 T1BN0 stage Ia   Adjuvant radiation 08/18/2019- 09/08/2019   Treatment plan:  She could not tolerate anastrozole  and therefore we discontinued it.    Breast Cancer Surveillance:  1. Mammogram 05/31/2023: Benign,  density cat A 2. Breast Exam: 08/03/2023: Benign   I discussed with her about guardant testing and she will think about it and will inform us . RTC in 1 year and after that she could be seen on an as-needed basis.      No orders of the defined types were placed in this encounter.  The patient has a good understanding of the overall plan. she agrees with it. she will call with any problems that may develop before the next visit here. Total time spent: 30 mins including face to face time and time spent for planning, charting and co-ordination of care   Viinay K Jonquil Stubbe, MD 08/03/23

## 2023-10-06 ENCOUNTER — Ambulatory Visit (HOSPITAL_BASED_OUTPATIENT_CLINIC_OR_DEPARTMENT_OTHER)

## 2023-10-06 ENCOUNTER — Encounter (HOSPITAL_BASED_OUTPATIENT_CLINIC_OR_DEPARTMENT_OTHER): Payer: Self-pay | Admitting: Student

## 2023-10-06 ENCOUNTER — Ambulatory Visit (INDEPENDENT_AMBULATORY_CARE_PROVIDER_SITE_OTHER): Admitting: Student

## 2023-10-06 DIAGNOSIS — M1711 Unilateral primary osteoarthritis, right knee: Secondary | ICD-10-CM | POA: Diagnosis not present

## 2023-10-06 DIAGNOSIS — M25561 Pain in right knee: Secondary | ICD-10-CM | POA: Diagnosis not present

## 2023-10-06 NOTE — Progress Notes (Signed)
 Chief Complaint: Right knee pain     History of Present Illness:    Kelly Beltran is a 68 y.o. female who presents today for evaluation of acute right knee pain.  She states the pain began a few days ago after standing up out of a chair.  Denies any popping, clicking, or catching.  Pain is described as achy, is worse with weightbearing, is located over the medial knee.  Denies any notable swelling.  Has trialed Voltaren gel but otherwise no pain medication.  Her symptoms have improved slightly as of today and she has been able to begin partial weightbearing.  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   Surgical History:   None  PMH/PSH/Family History/Social History/Meds/Allergies:    Past Medical History:  Diagnosis Date   Adenocarcinoma of cervix (HCC)    IN SITU   Arthritis    osteo arthritis   Breast CA (HCC)    Right Breast  Cancer   Cancer (HCC)    Diabetes mellitus    TYPE 2   Elevated cholesterol    Family history of bladder cancer    Family history of breast cancer    Family history of colon cancer    Family history of leukemia    Family history of prostate cancer    Fibroid    GERD (gastroesophageal reflux disease)    History of kidney stones    06/12/2019  Passed one 30month ago in her 20's had7 , passed all but 1   Hypertension    PONV (postoperative nausea and vomiting)    woke during lithrosplasty, Epidural with child birth - itching   Uterine cancer (HCC)    Past Surgical History:  Procedure Laterality Date   ABDOMINAL HYSTERECTOMY  1997   REPAIR OF CYSTOTOMY   BREAST LUMPECTOMY WITH RADIOACTIVE SEED AND SENTINEL LYMPH NODE BIOPSY Right 06/15/2019   Procedure: RIGHT BREAST LUMPECTOMY WITH RADIOACTIVE SEED AND SENTINEL LYMPH NODE BIOPSY;  Surgeon: Aron Shoulders, MD;  Location: [facility] OR;  Service: General;  Laterality: Right;  COMBINED WITH REGIONAL FOR POST OP PAIN   CERVIX LESION  DESTRUCTION     CHOLECYSTECTOMY     COLPOSCOPY     LITHOTRIPSY     Social History   Socioeconomic History   Marital status: Married    Spouse name: Not on file   Number of children: Not on file   Years of education: Not on file   Highest education level: Not on file  Occupational History   Not on file  Tobacco Use   Smoking status: Never   Smokeless tobacco: Never  Vaping Use   Vaping status: Never Used  Substance and Sexual Activity   Alcohol use: Not Currently   Drug use: No   Sexual activity: Yes    Partners: Male    Birth control/protection: Surgical    Comment: 1st intercourse- 24, partners- 3, married- 27 yrs ,hysterectomy  Other Topics Concern   Not on file  Social History Narrative   Not on file   Social Drivers of Health   Financial Resource Strain: Not on file  Food Insecurity: Not on file  Transportation Needs: Not on file  Physical Activity: Not on file  Stress: Not on file  Social Connections: Not on file   Family  History  Problem Relation Age of Onset   Diabetes Father    Hypertension Father    Prostate cancer Father 61   Diabetes Mother    Hypertension Mother    Breast cancer Mother 2       2nd diagnosis at 83   Atrial fibrillation Mother    Colon cancer Mother 73   Bladder Cancer Mother 37   Stroke Paternal Grandmother    Breast cancer Other        maternal cousin's daughter; dx. in her 75s, double mastectomy, may have had positive genetic test   Leukemia Maternal Aunt 1   Allergies  Allergen Reactions   Statins Other (See Comments)    Muscle pain and spasms   Amlodipine Besylate     Other Reaction(s): difficulty breathing, muscle weakness   Ezetimibe      Other Reaction(s): Unknown   Lipitor [Atorvastatin Calcium] Other (See Comments)    Muscle aches/fatigue   Lisinopril Cough   Losartan Potassium     Other Reaction(s): SOB, tachycardia after single dose   Mango Flavoring Agent (Non-Screening)     Redness   Pravachol Diarrhea  and Other (See Comments)    Muscle aches/fatigue   Spironolactone     Other Reaction(s): abd pain, distention, mastalgia   Sulfa Antibiotics Rash    ill feeling   Tetracyclines & Related Palpitations   Tylenol [Acetaminophen] Itching and Rash   Current Outpatient Medications  Medication Sig Dispense Refill   diclofenac Sodium (VOLTAREN) 1 % GEL Apply 1 g topically in the morning and at bedtime.     ezetimibe  (ZETIA ) 10 MG tablet Take 1 tablet (10 mg total) by mouth daily. 15 tablet 0   famotidine  (PEPCID ) 20 MG tablet Take 1 tablet (20 mg total) by mouth 2 (two) times daily.     fluticasone (FLONASE) 50 MCG/ACT nasal spray Place 2 sprays into both nostrils daily.     guaiFENesin (ROBITUSSIN) 100 MG/5ML liquid Take 200 mg by mouth 3 (three) times daily as needed for congestion.     hydrochlorothiazide (HYDRODIURIL) 25 MG tablet Take 25 mg by mouth daily.     ibuprofen (ADVIL) 200 MG tablet Take 400 mg by mouth every 8 (eight) hours as needed (pain.).     JANUMET 50-500 MG tablet Take 1 tablet by mouth 2 (two) times daily.     ketotifen (ZADITOR) 0.025 % ophthalmic solution Place 1 drop into both eyes 3 (three) times daily as needed (allergy eyes.).     lisinopril (PRINIVIL,ZESTRIL) 10 MG tablet Take 10 mg by mouth daily.      Probiotic Product (PROBIOTIC PO) Take 1 capsule by mouth daily.     Vitamin D3 (VITAMIN D) 25 MCG tablet Take 1,000 Units by mouth daily.     vitamin E 180 MG (400 UNITS) capsule Take 400 Units by mouth daily.     Zinc 50 MG TABS Take 50 mg by mouth daily.     No current facility-administered medications for this visit.   No results found.  Review of Systems:   A ROS was performed including pertinent positives and negatives as documented in the HPI.  Physical Exam :   Constitutional: NAD and appears stated age Neurological: Alert and oriented Psych: Appropriate affect and cooperative There were no vitals taken for this visit.   Comprehensive  Musculoskeletal Exam:    Exam of the right knee demonstrates tenderness over the medial joint line.  No effusion or erythema present.  Active range of  motion from 0 to 120 degrees with minimal crepitus.  Stable collaterals with varus and valgus stress.  Imaging:   Xray (right knee 4 views): Mild medial and patellofemoral compartment degenerative changes without evidence of acute abnormality   I personally reviewed and interpreted the radiographs.   Assessment:   68 y.o. female who presents today with acute right knee pain.  This occurred while standing up out of a chair a few days ago but denies any significant injury.  Pain is located over the medial joint line and based on her presentation I believe symptoms are most likely attributed to mild degenerative changes however unable to rule out possible underlying meniscal injury.  No mechanical symptoms present at this time.  She does have moderate to severe medial compartment arthritis on the contralateral side.  Discussed treatment options including cortisone injection versus more conservative therapies.  Patient would like to proceed with NSAIDs, rest, and knee bracing at this time.  Will likely consider injection in the future if symptoms do not continue to improve or resolve.  Plan :    - Return to clinic as needed     I personally saw and evaluated the patient, and participated in the management and treatment plan.  Leonce Reveal, PA-C Orthopedics

## 2023-10-20 ENCOUNTER — Ambulatory Visit (INDEPENDENT_AMBULATORY_CARE_PROVIDER_SITE_OTHER)

## 2023-10-20 ENCOUNTER — Other Ambulatory Visit (HOSPITAL_BASED_OUTPATIENT_CLINIC_OR_DEPARTMENT_OTHER): Payer: Self-pay

## 2023-10-20 ENCOUNTER — Ambulatory Visit (HOSPITAL_BASED_OUTPATIENT_CLINIC_OR_DEPARTMENT_OTHER): Admitting: Orthopaedic Surgery

## 2023-10-20 DIAGNOSIS — G5601 Carpal tunnel syndrome, right upper limb: Secondary | ICD-10-CM | POA: Diagnosis not present

## 2023-10-20 DIAGNOSIS — M25551 Pain in right hip: Secondary | ICD-10-CM

## 2023-10-20 DIAGNOSIS — M160 Bilateral primary osteoarthritis of hip: Secondary | ICD-10-CM | POA: Diagnosis not present

## 2023-10-20 DIAGNOSIS — M25552 Pain in left hip: Secondary | ICD-10-CM | POA: Diagnosis not present

## 2023-10-20 DIAGNOSIS — M25561 Pain in right knee: Secondary | ICD-10-CM

## 2023-10-20 MED ORDER — TRIAMCINOLONE ACETONIDE 40 MG/ML IJ SUSP
80.0000 mg | INTRAMUSCULAR | Status: AC | PRN
  Administered 2023-10-20: 80 mg via INTRA_ARTICULAR

## 2023-10-20 MED ORDER — LIDOCAINE HCL 1 % IJ SOLN
4.0000 mL | INTRAMUSCULAR | Status: AC | PRN
  Administered 2023-10-20: 4 mL

## 2023-10-20 NOTE — Addendum Note (Signed)
 Addended by: Joline Encalada L on: 10/20/2023 12:12 PM   Modules accepted: Level of Service

## 2023-10-20 NOTE — Progress Notes (Signed)
 Chief Complaint: Right hand, bilateral right hips, right knee    History of Present Illness:   10/20/2023: Presents today for follow-up predominantly of bilateral hips as well as the right hand and right knee  Kelly Beltran is a 68 y.o. female presents today with ongoing right shoulder pain for the last several months which has been worsening.  She has status post lumpectomy which occurred 4 years ago.  She is experiencing numbness in the first 3 digits.  She is having difficulty with internal rotation and putting on her bra.  She is experiencing stiffness.  This is awakening her at night.    Surgical History:   None  PMH/PSH/Family History/Social History/Meds/Allergies:    Past Medical History:  Diagnosis Date  . Adenocarcinoma of cervix (HCC)    IN SITU  . Arthritis    osteo arthritis  . Breast CA (HCC)    Right Breast  Cancer  . Cancer (HCC)   . Diabetes mellitus    TYPE 2  . Elevated cholesterol   . Family history of bladder cancer   . Family history of breast cancer   . Family history of colon cancer   . Family history of leukemia   . Family history of prostate cancer   . Fibroid   . GERD (gastroesophageal reflux disease)   . History of kidney stones    06/12/2019  Passed one 39month ago in her 20's had7 , passed all but 1  . Hypertension   . PONV (postoperative nausea and vomiting)    woke during lithrosplasty, Epidural with child birth - itching  . Uterine cancer [facility])    Past Surgical History:  Procedure Laterality Date  . ABDOMINAL HYSTERECTOMY  1997   REPAIR OF CYSTOTOMY  . BREAST LUMPECTOMY WITH RADIOACTIVE SEED AND SENTINEL LYMPH NODE BIOPSY Right 06/15/2019   Procedure: RIGHT BREAST LUMPECTOMY WITH RADIOACTIVE SEED AND SENTINEL LYMPH NODE BIOPSY;  Surgeon: Aron Shoulders, MD;  Location: [facility] OR;  Service: General;  Laterality: Right;  COMBINED WITH REGIONAL FOR POST OP PAIN  . CERVIX LESION DESTRUCTION    .  CHOLECYSTECTOMY    . COLPOSCOPY    . LITHOTRIPSY     Social History   Socioeconomic History  . Marital status: Married    Spouse name: Not on file  . Number of children: Not on file  . Years of education: Not on file  . Highest education level: Not on file  Occupational History  . Not on file  Tobacco Use  . Smoking status: Never  . Smokeless tobacco: Never  Vaping Use  . Vaping status: Never Used  Substance and Sexual Activity  . Alcohol use: Not Currently  . Drug use: No  . Sexual activity: Yes    Partners: Male    Birth control/protection: Surgical    Comment: 1st intercourse- 26, partners- 3, married- 27 yrs ,hysterectomy  Other Topics Concern  . Not on file  Social History Narrative  . Not on file   Social Drivers of Health   Financial Resource Strain: Not on file  Food Insecurity: Not on file  Transportation Needs: Not on file  Physical Activity: Not on file  Stress: Not on file  Social Connections: Not on file   Family History  Problem Relation Age of Onset  . Diabetes  Father   . Hypertension Father   . Prostate cancer Father 11  . Diabetes Mother   . Hypertension Mother   . Breast cancer Mother 47       2nd diagnosis at 87  . Atrial fibrillation Mother   . Colon cancer Mother 75  . Bladder Cancer Mother 68  . Stroke Paternal Grandmother   . Breast cancer Other        maternal cousin's daughter; dx. in her 30s, double mastectomy, may have had positive genetic test  . Leukemia Maternal Aunt 1   Allergies  Allergen Reactions  . Statins Other (See Comments)    Muscle pain and spasms  . Amlodipine Besylate     Other Reaction(s): difficulty breathing, muscle weakness  . Ezetimibe      Other Reaction(s): Unknown  . Lipitor [Atorvastatin Calcium] Other (See Comments)    Muscle aches/fatigue  . Lisinopril Cough  . Losartan Potassium     Other Reaction(s): SOB, tachycardia after single dose  . Mango Flavoring Agent (Non-Screening)     Redness  .  Pravachol Diarrhea and Other (See Comments)    Muscle aches/fatigue  . Spironolactone     Other Reaction(s): abd pain, distention, mastalgia  . Sulfa Antibiotics Rash    ill feeling  . Tetracyclines & Related Palpitations  . Tylenol [Acetaminophen] Itching and Rash   Current Outpatient Medications  Medication Sig Dispense Refill  . diclofenac Sodium (VOLTAREN) 1 % GEL Apply 1 g topically in the morning and at bedtime.    . ezetimibe  (ZETIA ) 10 MG tablet Take 1 tablet (10 mg total) by mouth daily. 15 tablet 0  . famotidine  (PEPCID ) 20 MG tablet Take 1 tablet (20 mg total) by mouth 2 (two) times daily.    . fluticasone (FLONASE) 50 MCG/ACT nasal spray Place 2 sprays into both nostrils daily.    SABRA guaiFENesin (ROBITUSSIN) 100 MG/5ML liquid Take 200 mg by mouth 3 (three) times daily as needed for congestion.    . hydrochlorothiazide (HYDRODIURIL) 25 MG tablet Take 25 mg by mouth daily.    SABRA ibuprofen (ADVIL) 200 MG tablet Take 400 mg by mouth every 8 (eight) hours as needed (pain.).    SABRA JANUMET 50-500 MG tablet Take 1 tablet by mouth 2 (two) times daily.    SABRA ketotifen (ZADITOR) 0.025 % ophthalmic solution Place 1 drop into both eyes 3 (three) times daily as needed (allergy eyes.).    SABRA lisinopril (PRINIVIL,ZESTRIL) 10 MG tablet Take 10 mg by mouth daily.     . Probiotic Product (PROBIOTIC PO) Take 1 capsule by mouth daily.    . Vitamin D3 (VITAMIN D) 25 MCG tablet Take 1,000 Units by mouth daily.    . vitamin E 180 MG (400 UNITS) capsule Take 400 Units by mouth daily.    . Zinc 50 MG TABS Take 50 mg by mouth daily.     No current facility-administered medications for this visit.   No results found.  Review of Systems:   A ROS was performed including pertinent positives and negatives as documented in the HPI.  Physical Exam :   Constitutional: NAD and appears stated age Neurological: Alert and oriented Psych: Appropriate affect and cooperative There were no vitals taken for this  visit.   Comprehensive Musculoskeletal Exam:    Bilateral tenderness about greater trochanters with pain in abduction.  This 30 degrees internal/external rotation of right hip.  She does have a nonantalgic gait without Trendelenburg  Right knee with tenderness  about the medial joint line.  Positive McMurray medially.  The range of motion is from 0 to 130 degrees.  Negative joint line tenderness  Imaging:   Xray (3 views right shoulder): Multiple foci of calcific tendinitis  X-rays 3 views right hip, 3 views left hip, 4 views right knee: Normal  I personally reviewed and interpreted the radiographs.   Assessment:   68 y.o. female right-hand-dominant with right knee pain consistent with a possible meniscal root injury x-rays today confirm no arthritis.  She did have an acute injury on this several months prior with subsequent pain and weakness with walking.  Given this we will plan for an MRI of the right knee.  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  At this time she would like to consider injections for this which I did provide after verbal consent was obtained.  With regard to the right hand she does have evidence of carpal tunnel syndrome and we will plan for referral to Dr. Agarwala for this. Plan :    - Bilateral hip ultrasound-guided injections provided after verbal consent obtained    Procedure Note  Patient: Kelly Beltran             Date of Birth: 23-Nov-1955           MRN: [id number]             Visit Date: 10/20/2023  Procedures: Visit Diagnoses:  1. Pain in left hip   2. Pain in right hip     Large Joint Inj: R greater trochanter on 10/20/2023 12:10 PM Indications: pain Details: 22 G 3.5 in needle, ultrasound-guided anterolateral approach  Arthrogram: No  Medications: 4 mL lidocaine  1 %; 80 mg triamcinolone  acetonide 40 MG/ML Outcome: tolerated well, no immediate complications Procedure, treatment alternatives,  risks and benefits explained, specific risks discussed. Consent was given by the patient. Immediately prior to procedure a time out was called to verify the correct patient, procedure, equipment, support staff and site/side marked as required. Patient was prepped and draped in the usual sterile fashion.    Large Joint Inj: L greater trochanter on 10/20/2023 12:10 PM Indications: pain Details: 22 G 3.5 in needle, ultrasound-guided anterolateral approach  Arthrogram: No  Medications: 4 mL lidocaine  1 %; 80 mg triamcinolone  acetonide 40 MG/ML Outcome: tolerated well, no immediate complications Procedure, treatment alternatives, risks and benefits explained, specific risks discussed. Consent was given by the patient. Immediately prior to procedure a time out was called to verify the correct patient, procedure, equipment, support staff and site/side marked as required. Patient was prepped and draped in the usual sterile fashion.            I personally saw and evaluated the patient, and participated in the management and treatment plan.  Elspeth Parker, MD Attending Physician, Orthopedic Surgery  This document was dictated using Dragon voice recognition software. A reasonable attempt at proof reading has been made to minimize errors.

## 2023-10-25 ENCOUNTER — Other Ambulatory Visit (HOSPITAL_BASED_OUTPATIENT_CLINIC_OR_DEPARTMENT_OTHER): Payer: Self-pay | Admitting: Orthopaedic Surgery

## 2023-10-25 DIAGNOSIS — G5601 Carpal tunnel syndrome, right upper limb: Secondary | ICD-10-CM

## 2023-11-05 ENCOUNTER — Ambulatory Visit: Admitting: Podiatry

## 2023-11-05 ENCOUNTER — Encounter: Payer: Self-pay | Admitting: Podiatry

## 2023-11-05 VITALS — Ht 62.0 in | Wt 165.6 lb

## 2023-11-05 DIAGNOSIS — M779 Enthesopathy, unspecified: Secondary | ICD-10-CM | POA: Diagnosis not present

## 2023-11-07 NOTE — Progress Notes (Signed)
 Subjective:   Patient ID: Kelly Beltran Hey, female   DOB: 68 y.o.   MRN: [id number]   HPI Patient presents stating that her feet seem to be doing okay but she has back issues and has a long-term history of orthotics but has not had pair replaced for around 10 years.  Patient wants them evaluated and feet.  Patient does not smoke likes to be active   Review of Systems  All other systems reviewed and are negative.       Objective:  Physical Exam Vitals and nursing note reviewed.  Constitutional:      Appearance: She is well-developed.  Pulmonary:     Effort: Pulmonary effort is normal.  Musculoskeletal:        General: Normal range of motion.  Skin:    General: Skin is warm.  Neurological:     Mental Status: She is alert.     Neurovascular status was found to be intact muscle strength was found to be adequate range of motion adequate with the patient noted to have minimal inflammation moderate flatfoot deformity and good digital perfusion well-oriented     Assessment:  Appears to be structural and well-controlled but orthotics that are very old     Plan:  H&P reviewed condition discussed orthotics do not recommend new orthotics currently as she does well with these even though they are very old.  I do not recommend any treatment currently but I do want her to wear good supportive shoe gear and will be seen back if symptoms should start

## 2023-11-09 DIAGNOSIS — L708 Other acne: Secondary | ICD-10-CM | POA: Diagnosis not present

## 2023-11-10 DIAGNOSIS — G720 Drug-induced myopathy: Secondary | ICD-10-CM | POA: Diagnosis not present

## 2023-11-10 DIAGNOSIS — T466X5A Adverse effect of antihyperlipidemic and antiarteriosclerotic drugs, initial encounter: Secondary | ICD-10-CM | POA: Diagnosis not present

## 2023-11-10 DIAGNOSIS — K219 Gastro-esophageal reflux disease without esophagitis: Secondary | ICD-10-CM | POA: Diagnosis not present

## 2023-11-10 DIAGNOSIS — R911 Solitary pulmonary nodule: Secondary | ICD-10-CM | POA: Diagnosis not present

## 2023-11-10 DIAGNOSIS — E1169 Type 2 diabetes mellitus with other specified complication: Secondary | ICD-10-CM | POA: Diagnosis not present

## 2023-11-10 DIAGNOSIS — E782 Mixed hyperlipidemia: Secondary | ICD-10-CM | POA: Diagnosis not present

## 2023-11-10 DIAGNOSIS — Z1211 Encounter for screening for malignant neoplasm of colon: Secondary | ICD-10-CM | POA: Diagnosis not present

## 2023-11-10 DIAGNOSIS — Z853 Personal history of malignant neoplasm of breast: Secondary | ICD-10-CM | POA: Diagnosis not present

## 2023-11-15 DIAGNOSIS — E782 Mixed hyperlipidemia: Secondary | ICD-10-CM | POA: Diagnosis not present

## 2023-11-15 DIAGNOSIS — E1169 Type 2 diabetes mellitus with other specified complication: Secondary | ICD-10-CM | POA: Diagnosis not present

## 2023-11-17 ENCOUNTER — Encounter: Payer: Self-pay | Admitting: Physical Medicine and Rehabilitation

## 2023-11-17 ENCOUNTER — Ambulatory Visit: Admitting: Physical Medicine and Rehabilitation

## 2023-11-17 DIAGNOSIS — M25511 Pain in right shoulder: Secondary | ICD-10-CM

## 2023-11-17 DIAGNOSIS — G8929 Other chronic pain: Secondary | ICD-10-CM

## 2023-11-17 DIAGNOSIS — R29898 Other symptoms and signs involving the musculoskeletal system: Secondary | ICD-10-CM | POA: Diagnosis not present

## 2023-11-17 DIAGNOSIS — M79641 Pain in right hand: Secondary | ICD-10-CM

## 2023-11-17 DIAGNOSIS — R202 Paresthesia of skin: Secondary | ICD-10-CM

## 2023-11-17 NOTE — Progress Notes (Signed)
 Pain Scale   Average Pain 0 Patient advising she has numbness, tingling and weakness in her Right hand. Patient is Right hand dominate        +Driver, -BT, -Dye Allergies.

## 2023-11-17 NOTE — Progress Notes (Signed)
 Kelly Beltran - 68 y.o. female MRN [id number]  Date of birth: 07-Nov-1955  Office Visit Note: Visit Date: 11/17/2023 PCP: Stephanie Charlene CROME, MD Referred by: Genelle Standing, MD  Subjective: Chief Complaint  Patient presents with   Right Hand - Numbness, Weakness   HPI: TYRELL BRERETON is a 68 y.o. female who comes in today at the request of Dr. Standing Genelle for evaluation and management of chronic, worsening and severe pain, numbness and tingling in the Right upper extremities.  Patient is Right hand dominant.  She reports a chronic several month history of right shoulder pain and hand pain with numbness and tingling and weakness.  She has most of the numbness and tingling in the radial 3 digits.  She is awakened at night with a positive flick sign.  She does get a lot of stiffness in the hand and some weakness with grip.  She does have some shoulder pain with mechanical rotation and using her shoulder.  No frank radicular symptoms down the arm.  Symptoms seem to radiate up the arm.  No left-sided complaints.  History of non-insulin-dependent type 2 diabetes.  Last hemoglobin A1c in the mid 7 range.  Also history of lumpectomy 4 years ago but no prior shoulder surgery.   I spent more than 30 minutes speaking face-to-face with the patient with 50% of the time in counseling and discussing coordination of care.      Review of Systems  Musculoskeletal:  Positive for joint pain.  Neurological:  Positive for tingling and focal weakness.  All other systems reviewed and are negative.  Otherwise per HPI.  Assessment & Plan: Visit Diagnoses:    ICD-10-CM   1. Paresthesia of skin  R20.2 NCV with EMG (electromyography)    2. Chronic right shoulder pain  M25.511    G89.29     3. Pain in right hand  M79.641     4. Right hand weakness  R29.898        Plan: Impression: Clinically this does seem to be a pretty significant carpal tunnel syndrome that may be also responsible for her  shoulder pain potentially.  Electrodiagnostic study performed today.  The above electrodiagnostic study is ABNORMAL and reveals evidence of a severe right median nerve entrapment at the wrist (carpal tunnel syndrome) affecting sensory and motor components.   There is no significant electrodiagnostic evidence of any other focal nerve entrapment, brachial plexopathy or cervical radiculopathy  Recommendations: 1.  Follow-up with referring physician. 2.  Continue current management of symptoms. 3.  Continue use of resting splint at night-time and as needed during the day. 4.  Suggest surgical evaluation.  Meds & Orders: No orders of the defined types were placed in this encounter.   Orders Placed This Encounter  Procedures   NCV with EMG (electromyography)    Follow-up: Return for Standing Genelle, MD.   Procedures: No procedures performed  EMG & NCV Findings: Evaluation of the right median motor nerve showed prolonged distal onset latency (5.6 ms), reduced amplitude (3.5 mV), and decreased conduction velocity (Elbow-Wrist, 31 m/s).  The right ulnar motor nerve showed decreased conduction velocity (B Elbow-Wrist, 50 m/s).  The right median (across palm) sensory nerve showed prolonged distal peak latency (Wrist, 5.5 ms) and prolonged distal peak latency (Palm, 2.5 ms).  All remaining nerves (as indicated in the following tables) were within normal limits.    Needle evaluation of the right abductor pollicis brevis muscle showed increased insertional activity and slightly increased  spontaneous activity.  All remaining muscles (as indicated in the following table) showed no evidence of electrical instability.    Impression: The above electrodiagnostic study is ABNORMAL and reveals evidence of a severe right median nerve entrapment at the wrist (carpal tunnel syndrome) affecting sensory and motor components.   There is no significant electrodiagnostic evidence of any other focal nerve entrapment,  brachial plexopathy or cervical radiculopathy  Recommendations: 1.  Follow-up with referring physician. 2.  Continue current management of symptoms. 3.  Continue use of resting splint at night-time and as needed during the day. 4.  Suggest surgical evaluation.  ___________________________ Prentice Masters FAAPMR Board Certified, American Board of Physical Medicine and Rehabilitation    Nerve Conduction Studies Anti Sensory Summary Table   Stim Site NR Peak (ms) Norm Peak (ms) P-T Amp (V) Norm P-T Amp Site1 Site2 Delta-P (ms) Dist (cm) Vel (m/s) Norm Vel (m/s)  Right Median Acr Palm Anti Sensory (2nd Digit)  29.8C  Wrist    *5.5 <3.6 17.3 >10 Wrist Palm 3.0 0.0    Palm    *2.5 <2.0 12.3         Right Radial Anti Sensory (Base 1st Digit)  29.8C  Wrist    2.6 <3.1 31.8  Wrist Base 1st Digit 2.6 0.0    Right Ulnar Anti Sensory (5th Digit)  30.1C  Wrist    3.5 <3.7 39.5 >15.0 Wrist 5th Digit 3.5 14.0 40 >38   Motor Summary Table   Stim Site NR Onset (ms) Norm Onset (ms) O-P Amp (mV) Norm O-P Amp Site1 Site2 Delta-0 (ms) Dist (cm) Vel (m/s) Norm Vel (m/s)  Right Median Motor (Abd Poll Brev)  29.9C    martin-gruber  Wrist    *5.6 <4.2 *3.5 >5 Elbow Wrist 5.8 18.0 *31 >50  Elbow    11.4  2.8         Right Ulnar Motor (Abd Dig Min)  30C  Wrist    3.3 <4.2 8.7 >3 B Elbow Wrist 3.6 18.0 *50 >53  B Elbow    6.9  5.6  A Elbow B Elbow 1.4 10.0 71 >53  A Elbow    8.3  6.1          EMG   Side Muscle Nerve Root Ins Act Fibs Psw Amp Dur Poly Recrt Int Bruna Comment  Right Abd Poll Brev Median C8-T1 *Incr *1+ *1+ Nml Nml 0 Nml Nml   Right 1stDorInt Ulnar C8-T1 Nml Nml Nml Nml Nml 0 Nml Nml   Right PronatorTeres Median C6-7 Nml Nml Nml Nml Nml 0 Nml Nml   Right Biceps Musculocut C5-6 Nml Nml Nml Nml Nml 0 Nml Nml   Right Deltoid Axillary C5-6 Nml Nml Nml Nml Nml 0 Nml Nml     Nerve Conduction Studies Anti Sensory Left/Right Comparison   Stim Site L Lat (ms) R Lat (ms) L-R Lat (ms) L Amp  (V) R Amp (V) L-R Amp (%) Site1 Site2 L Vel (m/s) R Vel (m/s) L-R Vel (m/s)  Median Acr Palm Anti Sensory (2nd Digit)  29.8C  Wrist  *5.5   17.3  Wrist Palm     Palm  *2.5   12.3        Radial Anti Sensory (Base 1st Digit)  29.8C  Wrist  2.6   31.8  Wrist Base 1st Digit     Ulnar Anti Sensory (5th Digit)  30.1C  Wrist  3.5   39.5  Wrist 5th Digit  40  Motor Left/Right Comparison   Stim Site L Lat (ms) R Lat (ms) L-R Lat (ms) L Amp (mV) R Amp (mV) L-R Amp (%) Site1 Site2 L Vel (m/s) R Vel (m/s) L-R Vel (m/s)  Median Motor (Abd Poll Brev)  29.9C    martin-gruber  Wrist  *5.6   *3.5  Elbow Wrist  *31   Elbow  11.4   2.8        Ulnar Motor (Abd Dig Min)  30C  Wrist  3.3   8.7  B Elbow Wrist  *50   B Elbow  6.9   5.6  A Elbow B Elbow  71   A Elbow  8.3   6.1           Waveforms:            Clinical History: No specialty comments available.   She reports that she has never smoked. She has never used smokeless tobacco. No results for input(s): HGBA1C, LABURIC in the last 8760 hours.  Objective:  VS:  HT:    WT:   BMI:     BP:   HR: bpm  TEMP: ( )  RESP:  Physical Exam Vitals and nursing note reviewed.  Constitutional:      General: She is not in acute distress.    Appearance: Normal appearance. She is well-developed. She is not ill-appearing.  HENT:     Head: Normocephalic and atraumatic.  Eyes:     Conjunctiva/sclera: Conjunctivae normal.     Pupils: Pupils are equal, round, and reactive to light.  Cardiovascular:     Rate and Rhythm: Normal rate.     Pulses: Normal pulses.  Pulmonary:     Effort: Pulmonary effort is normal.  Musculoskeletal:        General: No swelling, tenderness or deformity.     Right lower leg: No edema.     Left lower leg: No edema.     Comments: Inspection reveals flat pain of the right APB but no atrophy of the bilateral FDI or hand intrinsics. There is no swelling, color changes, allodynia or dystrophic changes. There is 5  out of 5 strength in the bilateral wrist extension, finger abduction and long finger flexion.  There is impaired sensation to light touch in the right median nerve distribution.  There is a negative Tinel's test at the bilateral wrist and elbow. There is a positive Phalen's test bilaterally. There is a negative Hoffmann's test bilaterally.  Skin:    General: Skin is warm and dry.     Findings: No erythema or rash.  Neurological:     General: No focal deficit present.     Mental Status: She is alert and oriented to person, place, and time.     Sensory: No sensory deficit.     Motor: No weakness or abnormal muscle tone.     Coordination: Coordination normal.     Gait: Gait normal.  Psychiatric:        Mood and Affect: Mood normal.        Behavior: Behavior normal.     Ortho Exam  Imaging: No results found.  Past Medical/Family/Surgical/Social History: Medications & Allergies reviewed per EMR, new medications updated. 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   Hyperlipidemia    Kidney stone    Past Medical History:  Diagnosis Date   Adenocarcinoma of cervix (HCC)    IN SITU   Arthritis    osteo arthritis   Breast CA (HCC)    Right Breast  Cancer   Cancer (HCC)    Diabetes mellitus    TYPE 2   Elevated cholesterol    Family history of bladder cancer    Family history of breast cancer    Family  history of colon cancer    Family history of leukemia    Family history of prostate cancer    Fibroid    GERD (gastroesophageal reflux disease)    History of kidney stones    06/12/2019  Passed one 4month ago in her 20's had7 , passed all but 1   Hypertension    PONV (postoperative nausea and vomiting)    woke during lithrosplasty, Epidural with child birth - itching   Uterine cancer (HCC)    Family History  Problem Relation Age of Onset   Diabetes Father    Hypertension Father    Prostate cancer Father 78   Diabetes Mother    Hypertension Mother    Breast cancer Mother 76       2nd diagnosis at 57   Atrial fibrillation Mother    Colon cancer Mother 40   Bladder Cancer Mother 30   Stroke Paternal Grandmother    Breast cancer Other        maternal cousin's daughter; dx. in her 90s, double mastectomy, may have had positive genetic test   Leukemia Maternal Aunt 1   Past Surgical History:  Procedure Laterality Date   ABDOMINAL HYSTERECTOMY  1997   REPAIR OF CYSTOTOMY   BREAST LUMPECTOMY WITH RADIOACTIVE SEED AND SENTINEL LYMPH NODE BIOPSY Right 06/15/2019   Procedure: RIGHT BREAST LUMPECTOMY WITH RADIOACTIVE SEED AND SENTINEL LYMPH NODE BIOPSY;  Surgeon: Aron Shoulders, MD;  Location: [facility] OR;  Service: General;  Laterality: Right;  COMBINED WITH REGIONAL FOR POST OP PAIN   CERVIX LESION DESTRUCTION     CHOLECYSTECTOMY     COLPOSCOPY     LITHOTRIPSY     Social History   Occupational History   Not on file  Tobacco Use   Smoking status: Never   Smokeless tobacco: Never  Vaping Use   Vaping status: Never Used  Substance and Sexual Activity   Alcohol use: Not Currently   Drug use: No   Sexual activity: Yes    Partners: Male    Birth control/protection: Surgical    Comment: 1st intercourse- 40, partners- 3, married- 27 yrs ,hysterectomy

## 2023-11-17 NOTE — Procedures (Signed)
 EMG & NCV Findings: Evaluation of the right median motor nerve showed prolonged distal onset latency (5.6 ms), reduced amplitude (3.5 mV), and decreased conduction velocity (Elbow-Wrist, 31 m/s).  The right ulnar motor nerve showed decreased conduction velocity (B Elbow-Wrist, 50 m/s).  The right median (across palm) sensory nerve showed prolonged distal peak latency (Wrist, 5.5 ms) and prolonged distal peak latency (Palm, 2.5 ms).  All remaining nerves (as indicated in the following tables) were within normal limits.    Needle evaluation of the right abductor pollicis brevis muscle showed increased insertional activity and slightly increased spontaneous activity.  All remaining muscles (as indicated in the following table) showed no evidence of electrical instability.    Impression: The above electrodiagnostic study is ABNORMAL and reveals evidence of a severe right median nerve entrapment at the wrist (carpal tunnel syndrome) affecting sensory and motor components.   There is no significant electrodiagnostic evidence of any other focal nerve entrapment, brachial plexopathy or cervical radiculopathy  Recommendations: 1.  Follow-up with referring physician. 2.  Continue current management of symptoms. 3.  Continue use of resting splint at night-time and as needed during the day. 4.  Suggest surgical evaluation.  ___________________________ Prentice Masters FAAPMR Board Certified, American Board of Physical Medicine and Rehabilitation    Nerve Conduction Studies Anti Sensory Summary Table   Stim Site NR Peak (ms) Norm Peak (ms) P-T Amp (V) Norm P-T Amp Site1 Site2 Delta-P (ms) Dist (cm) Vel (m/s) Norm Vel (m/s)  Right Median Acr Palm Anti Sensory (2nd Digit)  29.8C  Wrist    *5.5 <3.6 17.3 >10 Wrist Palm 3.0 0.0    Palm    *2.5 <2.0 12.3         Right Radial Anti Sensory (Base 1st Digit)  29.8C  Wrist    2.6 <3.1 31.8  Wrist Base 1st Digit 2.6 0.0    Right Ulnar Anti Sensory (5th Digit)   30.1C  Wrist    3.5 <3.7 39.5 >15.0 Wrist 5th Digit 3.5 14.0 40 >38   Motor Summary Table   Stim Site NR Onset (ms) Norm Onset (ms) O-P Amp (mV) Norm O-P Amp Site1 Site2 Delta-0 (ms) Dist (cm) Vel (m/s) Norm Vel (m/s)  Right Median Motor (Abd Poll Brev)  29.9C    martin-gruber  Wrist    *5.6 <4.2 *3.5 >5 Elbow Wrist 5.8 18.0 *31 >50  Elbow    11.4  2.8         Right Ulnar Motor (Abd Dig Min)  30C  Wrist    3.3 <4.2 8.7 >3 B Elbow Wrist 3.6 18.0 *50 >53  B Elbow    6.9  5.6  A Elbow B Elbow 1.4 10.0 71 >53  A Elbow    8.3  6.1          EMG   Side Muscle Nerve Root Ins Act Fibs Psw Amp Dur Poly Recrt Int Bruna Comment  Right Abd Poll Brev Median C8-T1 *Incr *1+ *1+ Nml Nml 0 Nml Nml   Right 1stDorInt Ulnar C8-T1 Nml Nml Nml Nml Nml 0 Nml Nml   Right PronatorTeres Median C6-7 Nml Nml Nml Nml Nml 0 Nml Nml   Right Biceps Musculocut C5-6 Nml Nml Nml Nml Nml 0 Nml Nml   Right Deltoid Axillary C5-6 Nml Nml Nml Nml Nml 0 Nml Nml     Nerve Conduction Studies Anti Sensory Left/Right Comparison   Stim Site L Lat (ms) R Lat (ms) L-R Lat (ms) L Amp (V)  R Amp (V) L-R Amp (%) Site1 Site2 L Vel (m/s) R Vel (m/s) L-R Vel (m/s)  Median Acr Palm Anti Sensory (2nd Digit)  29.8C  Wrist  *5.5   17.3  Wrist Palm     Palm  *2.5   12.3        Radial Anti Sensory (Base 1st Digit)  29.8C  Wrist  2.6   31.8  Wrist Base 1st Digit     Ulnar Anti Sensory (5th Digit)  30.1C  Wrist  3.5   39.5  Wrist 5th Digit  40    Motor Left/Right Comparison   Stim Site L Lat (ms) R Lat (ms) L-R Lat (ms) L Amp (mV) R Amp (mV) L-R Amp (%) Site1 Site2 L Vel (m/s) R Vel (m/s) L-R Vel (m/s)  Median Motor (Abd Poll Brev)  29.9C    martin-gruber  Wrist  *5.6   *3.5  Elbow Wrist  *31   Elbow  11.4   2.8        Ulnar Motor (Abd Dig Min)  30C  Wrist  3.3   8.7  B Elbow Wrist  *50   B Elbow  6.9   5.6  A Elbow B Elbow  71   A Elbow  8.3   6.1           Waveforms:

## 2023-11-24 ENCOUNTER — Ambulatory Visit: Admitting: Orthopedic Surgery

## 2023-11-24 DIAGNOSIS — G5601 Carpal tunnel syndrome, right upper limb: Secondary | ICD-10-CM

## 2023-11-24 DIAGNOSIS — Z1212 Encounter for screening for malignant neoplasm of rectum: Secondary | ICD-10-CM | POA: Diagnosis not present

## 2023-11-24 NOTE — Progress Notes (Signed)
 Kelly Beltran - 68 y.o. female MRN [id number]  Date of birth: 1955-08-19  Office Visit Note: Visit Date: 11/24/2023 PCP: Stephanie Charlene CROME, MD Referred by: Stephanie Charlene CROME, MD  Subjective: No chief complaint on file.  HPI: Kelly Beltran is a pleasant 68 y.o. female who presents today for evaluation of ongoing right hand numbness and tingling that has been present now for greater than 1 year, worsening in nature.  She has undergone previous electrodiagnostic testing which did confirm right sided carpal tunnel syndrome.  She has trialed activity modification without lasting relief.  At this juncture, given that her symptoms are progressing in nature and becoming more severe, she is interested in potential carpal tunnel release.  She is diabetic, recent A1c was 8.2, states that she has been keeping much more control of her ongoing sugar levels.  Was previously in the 6 range for A1c.   She is here today for specific hand surgical evaluation.  Pertinent ROS were reviewed with the patient and found to be negative unless otherwise specified above in HPI.   Visit Reason: Right carpal tunnel syndrome Duration of symptoms: 1 year Hand dominance: right Occupation: Programmer, multimedia  Diabetic: Yes / 8.2 Smoking: No Heart/Lung History:Coronary artery calcification Blood Thinners:  none  Prior Testing/EMG: EMG Injections (Date): None  Treatments: activity modification Prior Surgery: none  Assessment & Plan: Visit Diagnoses:  1. Carpal tunnel syndrome, right upper limb     Plan: Extensive discussion was had with the patient today about her ongoing right sided carpal tunnel syndrome that is refractory to conservative care.  Patient has both clinical and electrodiagnostic evidence to confirm this diagnosis.  At this juncture, she is indicated for right open versus endoscopic carpal tunnel release.  Risks and benefits of both operations were discussed in detail today.  Understanding all  risks and benefits, patient would like to have surgery done in the form of right open carpal tunnel release under local anesthesia in the office setting.  Risks include but not limited to infection, bleeding, scarring, stiffness, nerve injury or vascular, tendon injury, risk of recurrence and need for subsequent operation were all discussed in detail.  Patient consented understanding the above.  Will move forward surgical scheduling.   Follow-up: No follow-ups on file.   Meds & Orders: No orders of the defined types were placed in this encounter.  No orders of the defined types were placed in this encounter.    Procedures: No procedures performed      Clinical History: No specialty comments available.  She reports that she has never smoked. She has never used smokeless tobacco. No results for input(s): HGBA1C, LABURIC in the last 8760 hours.  Objective:   Vital Signs: There were no vitals taken for this visit.  Physical Exam  Gen: Well-appearing, in no acute distress; non-toxic CV: Regular Rate. Well-perfused. Warm.  Resp: Breathing unlabored on room air; no wheezing. Psych: Fluid speech in conversation; appropriate affect; normal thought process  Ortho Exam PHYSICAL EXAM:  General: Patient is well appearing and in no distress.   Skin and Muscle: No significant skin changes are apparent to upper extremities.   Range of Motion and Palpation Tests: Mobility is full about the elbows with flexion and extension. Forearm supination and pronation are 85/85 bilaterally.  Wrist flexion/extension is 75/65 bilaterally.  Digital flexion and extension are full.  Thumb opposition is full to the base of the small fingers bilaterally.    No cords or nodules  are palpated.  No triggering is observed.    Neurologic, Vascular, Motor: Sensation is diminished to light touch in the right median nerve distribution.    Thenar atrophy: Negative Tinel sign: Positive carpal tunnel right Carpal  tunnel compression: Positive right Phalen test: Positive right  Motor right hand FPL: 5/5 Index FDP: 5/5 APB: 5/5 Thumb opposition to small finger DPC  Fingers pink and well perfused.  Capillary refill is brisk.     Lab Results  Component Value Date   HGBA1C 7.6 (H) 06/12/2019     Imaging: No results found.  Past Medical/Family/Surgical/Social History: Medications & Allergies reviewed per EMR, new medications updated. 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   Hyperlipidemia    Kidney stone    Past Medical History:  Diagnosis Date   Adenocarcinoma of cervix (HCC)    IN SITU   Arthritis    osteo arthritis   Breast CA (HCC)    Right Breast  Cancer   Cancer (HCC)    Diabetes mellitus    TYPE 2   Elevated cholesterol    Family history of bladder cancer    Family history of breast cancer    Family history of colon cancer    Family history of leukemia    Family history of prostate cancer    Fibroid    GERD (gastroesophageal reflux disease)    History of kidney stones    06/12/2019  Passed one 82month ago in her 20's had7 , passed all but 1   Hypertension    PONV (postoperative nausea and vomiting)    woke during lithrosplasty, Epidural with child birth - itching   Uterine cancer (HCC)    Family History  Problem Relation Age of Onset   Diabetes Father    Hypertension Father    Prostate  cancer Father 64   Diabetes Mother    Hypertension Mother    Breast cancer Mother 46       2nd diagnosis at 57   Atrial fibrillation Mother    Colon cancer Mother 19   Bladder Cancer Mother 107   Stroke Paternal Grandmother    Breast cancer Other        maternal cousin's daughter; dx. in her 42s, double mastectomy, may have had positive genetic test   Leukemia Maternal Aunt 1   Past Surgical History:  Procedure Laterality Date   ABDOMINAL HYSTERECTOMY  1997   REPAIR OF CYSTOTOMY   BREAST LUMPECTOMY WITH RADIOACTIVE SEED AND SENTINEL LYMPH NODE BIOPSY Right 06/15/2019   Procedure: RIGHT BREAST LUMPECTOMY WITH RADIOACTIVE SEED AND SENTINEL LYMPH NODE BIOPSY;  Surgeon: Aron Shoulders, MD;  Location: [facility] OR;  Service: General;  Laterality: Right;  COMBINED WITH REGIONAL FOR POST OP PAIN   CERVIX LESION DESTRUCTION     CHOLECYSTECTOMY     COLPOSCOPY     LITHOTRIPSY     Social History  Occupational History   Not on file  Tobacco Use   Smoking status: Never   Smokeless tobacco: Never  Vaping Use   Vaping status: Never Used  Substance and Sexual Activity   Alcohol use: Not Currently   Drug use: No   Sexual activity: Yes    Partners: Male    Birth control/protection: Surgical    Comment: 1st intercourse- 61, partners- 3, married- 27 yrs ,hysterectomy    Levia Waltermire Estela) Arlinda, M.D. [facility], Hand Surgery

## 2023-12-01 LAB — COLOGUARD: COLOGUARD: NEGATIVE

## 2023-12-06 ENCOUNTER — Encounter: Payer: Self-pay | Admitting: Radiology

## 2023-12-07 ENCOUNTER — Other Ambulatory Visit: Payer: Self-pay

## 2023-12-07 DIAGNOSIS — G5601 Carpal tunnel syndrome, right upper limb: Secondary | ICD-10-CM

## 2023-12-14 ENCOUNTER — Other Ambulatory Visit: Payer: Self-pay | Admitting: Orthopedic Surgery

## 2023-12-14 ENCOUNTER — Ambulatory Visit: Admitting: Orthopedic Surgery

## 2023-12-14 DIAGNOSIS — G5601 Carpal tunnel syndrome, right upper limb: Secondary | ICD-10-CM | POA: Diagnosis not present

## 2023-12-14 MED ORDER — TRAMADOL HCL 50 MG PO TABS: 50.0000 mg | ORAL_TABLET | Freq: Four times a day (QID) | ORAL | 0 refills | Status: AC | PRN

## 2023-12-14 NOTE — Progress Notes (Signed)
 Procedure Note  Patient: Kelly Beltran             Date of Birth: 08-20-1955           MRN: [id number]             Visit Date: 12/14/2023  Procedures: Visit Diagnoses:  1. Carpal tunnel syndrome, right upper limb     NAME: SIDDA HUMM MEDICAL RECORD NO: [id number] DATE OF BIRTH: 1955/08/25 FACILITY: [facility] LOCATION: [facility] PHYSICIAN: GILDARDO ALDERTON, MD   OPERATIVE REPORT   DATE OF PROCEDURE: 12/14/23    PREOPERATIVE DIAGNOSIS: Right carpal tunnel syndrome   POSTOPERATIVE DIAGNOSIS: Right carpal tunnel syndrome   PROCEDURE: Right open carpal tunnel release   SURGEON:  Gildardo Alderton, M.D.   ASSISTANT: Joesph Dinsmore, OPA   ANESTHESIA:  Local   INTRAVENOUS FLUIDS:  Per anesthesia flow sheet.   ESTIMATED BLOOD LOSS:  Minimal.   COMPLICATIONS:  None.   SPECIMENS:  none   TOURNIQUET TIME:  4 minutes   DISPOSITION:  Stable to PACU.   INDICATIONS: 68 year old female with clinical and electrodiagnostic evidence of right-sided carpal tunnel syndrome refractory to conservative care.  Patient was indicated for right open carpal tunnel release.  Risks and benefits of surgery were discussed including the risks of infection, bleeding, scarring, stiffness, nerve injury, vascular injury, tendon injury, need for subsequent operation, persistent symptoms, recurrence.  She voiced understanding of these risks and elected to proceed.  OPERATIVE COURSE: Patient was seen and identified in the preprocedure area and marked appropriately.  Surgical consent had been signed.  Patient was transferred to the procedure room and placed in supine position with the right upper extremity on an arm board.  10 cc of 1% lidocaine  with epinephrine  was utilized around the planned incisional site.  Right upper extremity was prepped and draped in normal sterile orthopedic fashion.  A surgical pause was performed between the surgeon and staff, all were in agreement as to the patient,  procedure, and site of procedure.  Tourniquet was placed and padded appropriately to the right upper arm.  The arm was exsanguinated and the tourniquet was inflated to 250 mmHg.  Longitudinal incision was designed in the thenar crease in line with the radial border of the ring finger, from intersection of Kaplan's cardinal line down to level of the distal wrist crease. Incision was carried down utilizing 15 blade. Blunt dissection was performed, palmar fascia was identified and incised sharply utilizing a Beaver blade. Careful dissection was performed down, thenar musculature was bluntly elevated and the transcarpal ligament was identified.  A Beaver blade was then utilized to divide the transcarpal ligament in a distal to proximal fashion.  Fat surrounding the palmar arch was encountered to confirm appropriate distal release.  At the level of the wrist crease, skin flaps were elevated to allow for release of the proximal portion of transverse carpal ligament as well as the antebrachial fascia into the forearm. Appropriate decompression was noted of the median nerve, care was taken to protect the nerve in its entirety throughout. Once we were satisfied with our proximal and distal release, tourniquet was deflated and bipolar electrocautery was utilized for hemostasis.  Tourniquet time was 4 minutes.  Copious irrigation was performed followed by closure utilizing 4-0 nylon in standard fashion for the skin surface. Sterile dressings were applied followed by a loosefitting soft hand dressing. Patient was subsequently taken to the recovery area in stable condition.  Post-operative plan: The patient will recover and  then be discharged home.  The patient will be non weight bearing on the right upper extremity in a soft dressing.   I will see the patient back in the office in 2 weeks for postoperative followup.  Discharge instructions were provided for appropriate wound care, dressing maintenance and pain  control.   Philicia Heyne, MD Electronically signed, 12/14/23

## 2023-12-27 NOTE — Therapy (Signed)
 OUTPATIENT OCCUPATIONAL THERAPY ORTHO EVALUATION AND DISCHARGE NOTE  Patient Name: Kelly Beltran MRN: [id number] DOB:1955-10-30, 68 y.o., female Today's Date: 12/29/2023  REFERRING PROVIDER: Arlinda Buster, MD   END OF SESSION:   Past Medical History:  Diagnosis Date   Adenocarcinoma of cervix (HCC)    IN SITU   Arthritis    osteo arthritis   Breast CA (HCC)    Right Breast  Cancer   Cancer (HCC)    Diabetes mellitus    TYPE 2   Elevated cholesterol    Family history of bladder cancer    Family history of breast cancer    Family history of colon cancer    Family history of leukemia    Family history of prostate cancer    Fibroid    GERD (gastroesophageal reflux disease)    History of kidney stones    06/12/2019  Passed one 74month ago in her 20's had7 , passed all but 1   Hypertension    PONV (postoperative nausea and vomiting)    woke during lithrosplasty, Epidural with child birth - itching   Uterine cancer (HCC)    Past Surgical History:  Procedure Laterality Date   ABDOMINAL HYSTERECTOMY  1997   REPAIR OF CYSTOTOMY   BREAST LUMPECTOMY WITH RADIOACTIVE SEED AND SENTINEL LYMPH NODE BIOPSY Right 06/15/2019   Procedure: RIGHT BREAST LUMPECTOMY WITH RADIOACTIVE SEED AND SENTINEL LYMPH NODE BIOPSY;  Surgeon: Aron Shoulders, MD;  Location: [facility] OR;  Service: General;  Laterality: Right;  COMBINED WITH REGIONAL FOR POST OP PAIN   CERVIX LESION DESTRUCTION     CHOLECYSTECTOMY     COLPOSCOPY     LITHOTRIPSY     Patient Active Problem List   Diagnosis Date Noted   Other specified disorders of eustachian tube, left ear 06/17/2023   Sensorineural hearing loss, bilateral 06/17/2023   Coronary artery calcification 06/13/2020   Statin myopathy 06/13/2020   Family history of breast cancer    Family history of bladder cancer    Family history of colon cancer    Family history of prostate cancer    Family history of leukemia    Malignant neoplasm of upper-outer quadrant  of right breast in female, estrogen receptor positive (HCC) 05/18/2019   Menopausal state 03/30/2012   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 Now the patient states having some lingering paresthesia, stiffness, pain, decreased ability to make a fist and perform I/ADLs.     PERTINENT HISTORY: The patient is now approx 2 weeks s/p Rt hand CTR.   PRECAUTIONS: None relative to this evaluation and episode of care.   RED FLAGS: None   WEIGHT BEARING RESTRICTIONS: Yes: caution with weightbearing for the next 4-6 weeks, recommended less than 5lbs for next 2 weeks with affected hand  PAIN:  Are you having pain? Yes: NPRS scale:  mild now at rest  Pain location:  sx area Pain description: aching and sore Aggravating factors: gripping/squeezing Relieving factors: rest  FALLS: Has patient fallen in last 6  months? No, not a fall risk  PLOF: Independent with I/ADLs  PATIENT GOALS: To improve motion, function with affected surgical hand  NEXT MD VISIT: PRN    OBJECTIVE MEASURES:   ADLs: Overall ADLs: States decreased ability to grab, hold household objects, pain and difficulty to open containers, perform FMS tasks (manipulate fasteners on clothing).     UPPER EXTREMITY ROM:     A/ROM Right eval  Wrist flexion ~60  Wrist extension ~55  (Blank rows = not tested)                    Hand A/ROM Right eval  Full Fist Ability (or Gap to Distal Palmar Crease) Makes a loose, but full fist   Thumb Opposition  (K[place]andji  Scale)  7/10  (Blank rows = not tested)   HAND STRENGTH & FUNCTION: Eval: Observed weakness in affected hand/arm, grossly 3-/5 MMT, but specific gripping and resistance training contraindicated today. Also at least mild observed coordination impairments with affected hand/arm due to stiffness and soreness. These deficits are expected to improve with HEP and recommendations.    COORDINATION: Eval: Mild observed coordination impairments with surgical hand, as seen by pain,stiffness, etc. Expected to improve with HEP and recommendations.   SENSATION: Eval:  Light touch mildly diminished especially through sx area. Expected to improve with HEP and recommendations.   EDEMA:   Eval:  Mildly swollen in surgical hand today.  Expected to improve with HEP and recommendations.   COGNITION: Eval: Overall cognitive status: WFL for evaluation today   OBSERVATIONS:   Eval: Surgical site is clean and no overt signs of infection, no drainage, signs of dehiscence, etc.  Tenderness and swelling is within normal limits for post-op timeframe.     TODAY'S TREATMENT:  Post-evaluation treatment:   The patient was given safety information for managing post-op wound, including not to soak wound, to keep clean and dry, to start with gentle desensitization now and scar mobilizations approx 5-7 days after stitches are removed and if the wound is closed. The patient should monitor for signs of infection.  The patient was supplied with compressive gauze to help with swelling as needed.  The patient should contact the surgeon with any concerns immediately.   The patient should also avoid any strong gripping, push, pull, weight bearing or repetitive motion for the next month.  The patient  should not be doing painful activities.  The patient should not rest on their palm, keep the wrist bent for long time periods, or sleep on their hand. After a month, the patient can progressively return to all light, normal  activities. Sports and heavy weight lifting should be withheld for a total of 3 months.   The patient was also educated (explanation and demonstration) on the following home exercise program including tolerable range of motion, gentle passive range of motion, scar care, progressive desensitization, prevention of soft tissue contractures, etc. The patient states understanding all directions and feels comfortable with doing this at home, self-management, and following up with the surgeon as needed/scheduled.   Exercises - Turn [employer] Facing Up & Down  - 4 x daily - 15 reps - Bend and Pull Back Wrist SLOWLY  - 4 x daily - 15 reps - Tendon Glides  - 4 x daily - 5 reps - 3 second hold - Thumb Opposition  - 4-6 x daily - 10 reps - Median Nerve Flossing  - 3 x daily - 5 reps - Wrist Prayer Stretch  -  4 x daily - 3 reps - 15 second hold - Full finger stretches   - 4 x daily - 3 reps - 15 second hold - PUSH KNUCKLES DOWN  - 4 x daily - 3-5 reps - 15 seconds hold Patient Education - Scar Massage     PATIENT EDUCATION: Education details: See tx section above for details  Person educated: Patient Education method: Verbal Instruction, Teach back, Handouts  Education comprehension: States and demonstrates understanding   HOME EXERCISE PROGRAM: Access Code: [email or web address] URL: [email or web address] Date: 12/29/2023 Prepared by: Melvenia Ada   GOALS: Goals reviewed with patient? Yes   SHORT TERM GOALS: (STG required if POC>30 days) Target Date: 12/29/23  1.  Pt will demo/state understanding of initial HEP and therapist recommendations to improve pain levels, improve motions and ability and eventually return to normal activities.   Goal status: MET    ASSESSMENT:  CLINICAL IMPRESSION: Patient is a 68 y.o. female who was seen today for occupational therapy evaluation for swelling, pain, weakness and decreased functional ability following carpal tunnel release procedure. The  patient is appropriate for OT rehab services and benefited from treatment today. The patient got copious education/treatment today for self-care, wound management, exercises and how to transition to normal activities in the next 4-6 weeks. The patient agrees that they can manage these recommendations independently, and should not need to return for follow up visits. The patient should follow up with the surgeon with any concerns, and could possibly return to therapy, if needed, with a new order.  The patient will discharge therapy treatment after this visit.     PERFORMANCE DEFICITS: in functional skills including ADLs, IADLs, coordination, dexterity, sensation, edema, ROM, strength, pain, fascial restrictions, flexibility, Fine motor control, body mechanics, endurance, decreased knowledge of precautions, wound, and UE functional use, cognitive skills including problem solving and safety awareness, and psychosocial skills including coping strategies, environmental adaptation, and habits.   IMPAIRMENTS: are limiting patient from ADLs, IADLs, rest and sleep, leisure, and social participation.   COMORBIDITIES: may have co-morbidities  that affects occupational performance. Patient will benefit from skilled OT to address above impairments and improve overall function.  MODIFICATION OR ASSISTANCE TO COMPLETE EVALUATION: No modification of tasks or assist necessary to complete an evaluation.  OT OCCUPATIONAL PROFILE AND HISTORY: Problem focused assessment: Including review of records relating to presenting problem.  CLINICAL DECISION MAKING: LOW - limited treatment options, no task modification necessary  REHAB POTENTIAL: Excellent  EVALUATION COMPLEXITY: Low      PLAN:  OT FREQUENCY: one time visit  OT DURATION: 1 sessions  PLANNED INTERVENTIONS: self care/ADL training, therapeutic exercise, therapeutic activity, neuromuscular re-education, manual therapy, scar mobilization, passive range of  motion, splinting, ultrasound, fluidotherapy, compression bandaging, moist heat, cryotherapy, contrast bath, patient/family education, energy conservation, coping strategies training, and Re-evaluation  RECOMMENDED OTHER SERVICES: none now   CONSULTED AND AGREED WITH PLAN OF CARE: Patient  PLAN FOR NEXT SESSION:   N/A    Melvenia Ada, OTR/L, CHT 12/29/2023, 11:15 AM    Referring diagnosis? G56.01 (ICD-10-CM) - Carpal tunnel syndrome, right upper limb  Treatment diagnosis? (if different than referring diagnosis) M25.531  What was this (referring dx) caused by? [x]  Surgery []  Fall []  Ongoing issue []  Arthritis []  Other: ____________  Laterality: [x]  Rt []  Lt []  Both  Check all possible CPT codes:  *CHOOSE 10 OR LESS*     [id number],  [id number], [id number]

## 2023-12-28 ENCOUNTER — Ambulatory Visit: Admitting: Orthopedic Surgery

## 2023-12-28 DIAGNOSIS — Z9889 Other specified postprocedural states: Secondary | ICD-10-CM

## 2023-12-28 NOTE — Progress Notes (Signed)
   SHAWNYA MAYOR - 68 y.o. female MRN [id number]  Date of birth: 17-Nov-1955  Office Visit Note: Visit Date: 12/28/2023 PCP: Stephanie Charlene CROME, MD Referred by: Stephanie Charlene CROME, MD  Subjective:  HPI: CAYDEN RAUTIO is a 68 y.o. female who presents today for follow up 2 weeks status post right wrist open carpal tunnel release.  She is doing very well overall, numbness and tingling has improved drastically, she is pleased with her progress.  Pertinent ROS were reviewed with the patient and found to be negative unless otherwise specified above in HPI.   Assessment & Plan: Visit Diagnoses:  1. S/P carpal tunnel release     Plan: Sutures removed today, she is demonstrating appropriate healing.  I am pleased to see how well her nerve is recovering at this time.  She will be seen by occupational therapy tomorrow to begin range of motion exercises with transition to home exercise program.  She can follow-up with myself in approximately 1 month.  Follow-up: No follow-ups on file.   Meds & Orders: No orders of the defined types were placed in this encounter.  No orders of the defined types were placed in this encounter.    Procedures: No procedures performed       Objective:   Vital Signs: There were no vitals taken for this visit.  Ortho Exam Right hand: - Well-healing palmar incision, sutures removed, skin edges well-approximated without erythema or drainage - Composite fist without restriction - Sensation intact to light touch in median nerve distribution - 5/5 APB no thenar atrophy   Imaging: No results found.   Karlynn Furrow Afton Alderton, M.D. [facility], Hand Surgery

## 2023-12-29 ENCOUNTER — Ambulatory Visit: Admitting: Rehabilitative and Restorative Service Providers"

## 2023-12-29 ENCOUNTER — Encounter: Payer: Self-pay | Admitting: Rehabilitative and Restorative Service Providers"

## 2023-12-29 DIAGNOSIS — M25531 Pain in right wrist: Secondary | ICD-10-CM | POA: Diagnosis not present

## 2023-12-29 DIAGNOSIS — R278 Other lack of coordination: Secondary | ICD-10-CM | POA: Diagnosis not present

## 2023-12-29 DIAGNOSIS — R600 Localized edema: Secondary | ICD-10-CM | POA: Diagnosis not present

## 2023-12-29 NOTE — Patient Instructions (Signed)
  Nate's Carpal Tunnel Release Recommendations   Do not to soak your wound for at least 1 week, or until it looks well healed. Wash your hand or shower quickly, then dab dry gently. You can put a Vaseline or plain, oil-based lotion on your wound to help the scar heal smoothly. (Don't leave it looking dry and crusty.)   Try not to lean on your palm, sleep on your hand or position your wrist in a bent position for a long time. These things put pressure on your healing nerve. Your sensation (or tingling/numbness) should slowly improve as your nerve heals, but it can be normal to take 3-6 months to fully heal.   If you had a night-time brace before surgery, I recommend wearing it loosely, every night, for 2 weeks to protect your surgery!   Avoid any strong gripping, push, pull, weight bearing or repetitive motion with your surgical arm for the next 6 weeks.  (If it hurts very badly- don't do it!) After 6 weeks, you can progressively return to all normal, light activities. Sports and heavy weightlifting should be withheld for about 3 months.   Start lightly touching the surface of your skin to decrease pain and increase normal sensation (2-3 minutes 4-6x day). Once your surgical wound is looking healed and closed (about 3 days after stitches are out) start touching/rubbing your scar to gently to shift the top layer of your skin. (It's ok to do this gently over top of steri-strips.) Do not remove steri-strips, but wait for them to fall off naturally. Rub, touch gently for 2-3 minutes, 3-4 x day.      It's normal to have some "buzzing" or "burning" pain after a nerve surgery. If you have any tingling or itching/burning around your scar, just lightly rub/brush it with a cloth for 2-3 mins to calm your nerves. Do it progressively and rub and touch more aggressively as the weeks go on. Keep it pretty light for the first 3-4 weeks.   Begin with gentle motion exercises about 4 times a day, non-painfully, feeling  light to medium tension.  (See exercise sheet)  Wait 5-6 weeks after surgery to use "squeeze balls" or "hand grippers"  Wait 7-8 weeks after surgery to start strengthening in your arm in a gym setting  In general, keep your hand and wrist moving, and don't let it heal stiffly.  If you're sore- slow down!   If you're stiff- speed up!  If you are having any new problems, lingering stiffness, pain, signs of infection (redness, swelling, oozing puss, tenderness), contact your surgeon immediately.  He may send you to formal hand therapy.

## 2024-01-07 ENCOUNTER — Telehealth: Payer: Self-pay | Admitting: Orthopedic Surgery

## 2024-01-07 NOTE — Telephone Encounter (Signed)
 Pt called saying that she has a question about her incision. Call back number is [phone].

## 2024-01-07 NOTE — Telephone Encounter (Signed)
 Spoke with patient. She said it looked as if it was opening, but told her this is completely normal It heals from the inside out

## 2024-01-24 ENCOUNTER — Encounter: Payer: Self-pay | Admitting: Podiatry

## 2024-01-24 ENCOUNTER — Ambulatory Visit: Admitting: Podiatry

## 2024-01-24 DIAGNOSIS — M7751 Other enthesopathy of right foot: Secondary | ICD-10-CM

## 2024-01-24 DIAGNOSIS — R600 Localized edema: Secondary | ICD-10-CM

## 2024-01-24 DIAGNOSIS — M779 Enthesopathy, unspecified: Secondary | ICD-10-CM

## 2024-01-24 DIAGNOSIS — M7752 Other enthesopathy of left foot: Secondary | ICD-10-CM | POA: Diagnosis not present

## 2024-01-24 NOTE — Progress Notes (Signed)
 Patient seen today during visit with Dr. Magdalen for orthotic scan.    Patient will benefit from custom foot orthotics to provide total contact to bilateral medial longitudinal arches to help balance and distribute body weight more evenly.  Thus reducing plantar pressure and pain.   Orthotic will encourage forefoot and rearfoot alignment.    Patient was scanned today with OHI scanner.    Orthotics are ordered.  Signature obtained for notification of pricing/ fees for the device.  When the orthotic is ready for pick up, will call to make an appointment for a fitting.    Keevan Wolz, DPM

## 2024-01-26 NOTE — Progress Notes (Signed)
 Subjective:   Patient ID: Kelly Beltran Hey, female   DOB: 68 y.o.   MRN: [id number]   HPI Patient presents with foot pain and needs some kind of support therapy with orthotics that no longer fit properly   ROS      Objective:  Physical Exam  Neurovascular status intact muscle strength adequate discomfort of a moderate nature foot swelling of the ankle noted and a history of orthotics which would benefit     Assessment:  Inflammatory condition with swelling which has occurred with negative T[place]a' sign noted     Plan:  H&P reviewed all conditions and discussed shoe gear modifications compression and dispensed ankle compression stockings elevation and will see family physician.  I did go ahead today and we casted for functional orthotics bipedal orthospine physician who evaluated the patient and made this judgment

## 2024-01-31 ENCOUNTER — Ambulatory Visit: Admitting: Orthopedic Surgery

## 2024-02-01 NOTE — Progress Notes (Unsigned)
" ° °  EKATERINI CAPITANO - 68 y.o. female MRN [id number]  Date of birth: 10/08/1955  Office Visit Note: Visit Date: 02/02/2024 PCP: Stephanie Charlene CROME, MD Referred by: Stephanie Charlene CROME, MD  Subjective:  HPI: Kelly Beltran is a 67 y.o. female who presents today for follow up 7 weeks status post right wrist open carpal tunnel release.  Pertinent ROS were reviewed with the patient and found to be negative unless otherwise specified above in HPI.   Assessment & Plan: Visit Diagnoses: No diagnosis found.  Plan: ***  Follow-up: No follow-ups on file.   Meds & Orders: No orders of the defined types were placed in this encounter.  No orders of the defined types were placed in this encounter.    Procedures: No procedures performed       Objective:   Vital Signs: There were no vitals taken for this visit.  Ortho Exam ***  Imaging: No results found.   Nic Lampe Afton Alderton, M.D. [facility], Hand Surgery  "

## 2024-02-02 ENCOUNTER — Ambulatory Visit (INDEPENDENT_AMBULATORY_CARE_PROVIDER_SITE_OTHER): Admitting: Orthopedic Surgery

## 2024-02-02 DIAGNOSIS — G5601 Carpal tunnel syndrome, right upper limb: Secondary | ICD-10-CM

## 2024-02-02 DIAGNOSIS — Z9889 Other specified postprocedural states: Secondary | ICD-10-CM

## 2024-02-18 ENCOUNTER — Telehealth: Payer: Self-pay | Admitting: Podiatry

## 2024-02-18 NOTE — Telephone Encounter (Signed)
 Ortho in [place], spoke with pt, appt 02/25/2024 [place] office

## 2024-02-25 ENCOUNTER — Ambulatory Visit (INDEPENDENT_AMBULATORY_CARE_PROVIDER_SITE_OTHER): Admitting: Podiatrist

## 2024-02-25 DIAGNOSIS — M779 Enthesopathy, unspecified: Secondary | ICD-10-CM

## 2024-02-25 NOTE — Progress Notes (Signed)
 ORTHOTIC DISPENSING:   Reason for Visit:         Fitting and Delivery of Custom Fabricated Foot Orthoses Patient Report:            Patient reports comfort and is satisfied with device.   OBJECTIVE DATA: Patient History / Diagnosis:    No change in pathology Provided Device:                     Functional foot orthoses   GOAL OF ORTHOSIS - Improve gait - Decrease energy expenditure - Improve Balance - Provide Triplanar stability of foot complex - Facilitate motion   ACTIONS PERFORMED Patient was fit with custom foot orthoses   Patient was provided with verbal and written instruction and demonstration regarding wear, care, proper fit, function, and use of the orthosis.    Patient was also provided with verbal instruction regarding how to report any failures or malfunctions of the orthosis and necessary follow up care. Patient was also instructed to contact our office regarding any change in status that may affect the function of the orthosis.   Patient demonstrated understanding of all instructions.  Kelly Beltran, DPM

## 2024-08-02 ENCOUNTER — Ambulatory Visit: Admitting: Hematology and Oncology
